# Patient Record
Sex: Female | Born: 1950
Health system: Southern US, Community
[De-identification: ages and names within clinical notes are randomized; demographics above are authoritative.]

## PROBLEM LIST (undated history)

## (undated) DIAGNOSIS — E7800 Pure hypercholesterolemia, unspecified: Secondary | ICD-10-CM

## (undated) DIAGNOSIS — F419 Anxiety disorder, unspecified: Secondary | ICD-10-CM

## (undated) DIAGNOSIS — D649 Anemia, unspecified: Secondary | ICD-10-CM

## (undated) DIAGNOSIS — K219 Gastro-esophageal reflux disease without esophagitis: Secondary | ICD-10-CM

## (undated) DIAGNOSIS — I2510 Atherosclerotic heart disease of native coronary artery without angina pectoris: Secondary | ICD-10-CM

## (undated) DIAGNOSIS — Z8619 Personal history of other infectious and parasitic diseases: Secondary | ICD-10-CM

## (undated) DIAGNOSIS — C189 Malignant neoplasm of colon, unspecified: Secondary | ICD-10-CM

## (undated) DIAGNOSIS — T7840XA Allergy, unspecified, initial encounter: Secondary | ICD-10-CM

## (undated) DIAGNOSIS — M1990 Unspecified osteoarthritis, unspecified site: Secondary | ICD-10-CM

## (undated) DIAGNOSIS — Z0000 Encounter for general adult medical examination without abnormal findings: Secondary | ICD-10-CM

## (undated) DIAGNOSIS — Z124 Encounter for screening for malignant neoplasm of cervix: Secondary | ICD-10-CM

## (undated) DIAGNOSIS — H9190 Unspecified hearing loss, unspecified ear: Secondary | ICD-10-CM

## (undated) DIAGNOSIS — I10 Essential (primary) hypertension: Secondary | ICD-10-CM

## (undated) DIAGNOSIS — H269 Unspecified cataract: Secondary | ICD-10-CM

## (undated) DIAGNOSIS — Z780 Asymptomatic menopausal state: Secondary | ICD-10-CM

## (undated) DIAGNOSIS — E559 Vitamin D deficiency, unspecified: Secondary | ICD-10-CM

## (undated) HISTORY — DX: Asymptomatic menopausal state: Z78.0

## (undated) HISTORY — DX: Anxiety disorder, unspecified: F41.9

## (undated) HISTORY — DX: Unspecified cataract: H26.9

## (undated) HISTORY — DX: Anemia, unspecified: D64.9

## (undated) HISTORY — DX: Unspecified hearing loss, unspecified ear: H91.90

## (undated) HISTORY — DX: Atherosclerotic heart disease of native coronary artery without angina pectoris: I25.10

## (undated) HISTORY — DX: Gastro-esophageal reflux disease without esophagitis: K21.9

## (undated) HISTORY — DX: Unspecified osteoarthritis, unspecified site: M19.90

## (undated) HISTORY — DX: Essential (primary) hypertension: I10

## (undated) HISTORY — PX: CYSTECTOMY: SUR359

## (undated) HISTORY — DX: Allergy, unspecified, initial encounter: T78.40XA

## (undated) HISTORY — PX: CORONARY STENT PLACEMENT: SHX1402

## (undated) HISTORY — DX: Encounter for screening for malignant neoplasm of cervix: Z12.4

## (undated) HISTORY — DX: Malignant neoplasm of colon, unspecified: C18.9

## (undated) HISTORY — DX: Pure hypercholesterolemia, unspecified: E78.00

## (undated) HISTORY — PX: BACK SURGERY: SHX140

## (undated) HISTORY — DX: Vitamin D deficiency, unspecified: E55.9

## (undated) HISTORY — DX: Encounter for general adult medical examination without abnormal findings: Z00.00

## (undated) HISTORY — DX: Personal history of other infectious and parasitic diseases: Z86.19

---

## 2007-05-12 ENCOUNTER — Ambulatory Visit: Payer: Self-pay | Admitting: Cardiovascular Disease

## 2007-05-26 ENCOUNTER — Ambulatory Visit: Payer: Self-pay

## 2007-10-18 ENCOUNTER — Ambulatory Visit: Payer: Self-pay | Admitting: Cardiovascular Disease

## 2007-10-27 ENCOUNTER — Ambulatory Visit: Payer: Self-pay | Admitting: Cardiovascular Disease

## 2007-10-27 LAB — CONVERTED CEMR LAB
ALT: 36 units/L — ABNORMAL HIGH (ref 0–35)
AST: 37 units/L (ref 0–37)
Albumin: 4 g/dL (ref 3.5–5.2)
Bilirubin, Direct: 0.2 mg/dL (ref 0.0–0.3)
HDL: 51.1 mg/dL (ref >39.0)
LDL Cholesterol: 74 mg/dL (ref 0–99)
VLDL: 13 mg/dL (ref 0–40)

## 2008-06-12 ENCOUNTER — Ambulatory Visit: Payer: Self-pay | Admitting: Cardiovascular Disease

## 2008-07-16 HISTORY — PX: CHOLECYSTECTOMY: SHX55

## 2008-09-15 DIAGNOSIS — C189 Malignant neoplasm of colon, unspecified: Secondary | ICD-10-CM

## 2008-09-15 HISTORY — PX: COLON SURGERY: SHX602

## 2008-09-15 HISTORY — PX: BILATERAL OOPHORECTOMY: SHX1221

## 2008-09-15 HISTORY — DX: Malignant neoplasm of colon, unspecified: C18.9

## 2008-12-05 DIAGNOSIS — I10 Essential (primary) hypertension: Secondary | ICD-10-CM | POA: Insufficient documentation

## 2008-12-05 DIAGNOSIS — I209 Angina pectoris, unspecified: Secondary | ICD-10-CM | POA: Insufficient documentation

## 2008-12-05 DIAGNOSIS — E7800 Pure hypercholesterolemia, unspecified: Secondary | ICD-10-CM | POA: Insufficient documentation

## 2008-12-05 DIAGNOSIS — K219 Gastro-esophageal reflux disease without esophagitis: Secondary | ICD-10-CM | POA: Insufficient documentation

## 2008-12-06 ENCOUNTER — Ambulatory Visit: Payer: Self-pay | Admitting: Cardiovascular Disease

## 2008-12-06 LAB — CONVERTED CEMR LAB
ALT: 22 units/L (ref 0–35)
Bilirubin, Direct: 0.2 mg/dL (ref 0.0–0.3)
Cholesterol: 131 mg/dL (ref 0–200)
Creatinine, Ser: 0.6 mg/dL (ref 0.4–1.2)
HDL: 48.6 mg/dL (ref >39.00)
LDL Cholesterol: 65 mg/dL (ref 0–99)
Potassium: 4.1 meq/L (ref 3.5–5.1)
Sodium: 144 meq/L (ref 135–145)
Total Bilirubin: 0.8 mg/dL (ref 0.3–1.2)
Total CK: 83 units/L (ref 7–177)
Total Protein: 7.2 g/dL (ref 6.0–8.3)
VLDL: 17.2 mg/dL (ref 0.0–40.0)

## 2009-02-21 HISTORY — PX: APPENDECTOMY: SHX54

## 2009-03-13 ENCOUNTER — Encounter: Payer: Self-pay | Admitting: Cardiovascular Disease

## 2009-05-28 ENCOUNTER — Ambulatory Visit: Payer: Self-pay | Admitting: Cardiovascular Disease

## 2009-05-29 ENCOUNTER — Encounter (INDEPENDENT_AMBULATORY_CARE_PROVIDER_SITE_OTHER): Payer: Self-pay | Admitting: *Deleted

## 2009-05-29 ENCOUNTER — Encounter: Payer: Self-pay | Admitting: Cardiovascular Disease

## 2009-05-29 ENCOUNTER — Telehealth: Payer: Self-pay | Admitting: Cardiovascular Disease

## 2009-05-30 ENCOUNTER — Ambulatory Visit: Payer: Self-pay | Admitting: Cardiovascular Disease

## 2009-05-30 DIAGNOSIS — D66 Hereditary factor VIII deficiency: Secondary | ICD-10-CM | POA: Insufficient documentation

## 2009-05-30 DIAGNOSIS — R079 Chest pain, unspecified: Secondary | ICD-10-CM | POA: Insufficient documentation

## 2009-05-30 DIAGNOSIS — R0789 Other chest pain: Secondary | ICD-10-CM | POA: Insufficient documentation

## 2009-05-31 ENCOUNTER — Inpatient Hospital Stay (HOSPITAL_BASED_OUTPATIENT_CLINIC_OR_DEPARTMENT_OTHER): Admission: RE | Admit: 2009-05-31 | Discharge: 2009-05-31 | Payer: Self-pay | Admitting: Cardiovascular Disease

## 2009-05-31 ENCOUNTER — Ambulatory Visit: Payer: Self-pay | Admitting: Cardiovascular Disease

## 2009-06-04 LAB — CONVERTED CEMR LAB
BUN: 13 mg/dL (ref 6–23)
Basophils Absolute: 0 10*3/uL (ref 0.0–0.1)
CO2: 26 meq/L (ref 19–32)
Calcium: 9.4 mg/dL (ref 8.4–10.5)
Chloride: 104 meq/L (ref 96–112)
Creatinine, Ser: 0.7 mg/dL (ref 0.4–1.2)
Eosinophils Absolute: 0 10*3/uL (ref 0.0–0.7)
INR: 1 (ref 0.8–1.0)
Lymphocytes Relative: 30 % (ref 12.0–46.0)
MCHC: 32.3 g/dL (ref 30.0–36.0)
MCV: 74.5 fL — ABNORMAL LOW (ref 78.0–100.0)
Monocytes Absolute: 0.5 10*3/uL (ref 0.1–1.0)
Neutrophils Relative %: 61.2 % (ref 43.0–77.0)
Platelets: 283 10*3/uL (ref 150.0–400.0)
Prothrombin Time: 10.3 s (ref 9.1–11.7)
RDW: 14 % (ref 11.5–14.6)

## 2009-06-12 ENCOUNTER — Encounter: Payer: Self-pay | Admitting: Cardiovascular Disease

## 2009-06-13 ENCOUNTER — Ambulatory Visit: Payer: Self-pay | Admitting: Cardiovascular Disease

## 2009-06-13 DIAGNOSIS — I2510 Atherosclerotic heart disease of native coronary artery without angina pectoris: Secondary | ICD-10-CM | POA: Insufficient documentation

## 2009-06-19 ENCOUNTER — Telehealth (INDEPENDENT_AMBULATORY_CARE_PROVIDER_SITE_OTHER): Payer: Self-pay | Admitting: *Deleted

## 2009-07-09 ENCOUNTER — Telehealth (INDEPENDENT_AMBULATORY_CARE_PROVIDER_SITE_OTHER): Payer: Self-pay | Admitting: *Deleted

## 2009-10-10 ENCOUNTER — Encounter (INDEPENDENT_AMBULATORY_CARE_PROVIDER_SITE_OTHER): Payer: Self-pay | Admitting: *Deleted

## 2009-11-01 ENCOUNTER — Encounter: Payer: Self-pay | Admitting: Cardiovascular Disease

## 2009-11-16 ENCOUNTER — Ambulatory Visit: Payer: Self-pay | Admitting: Cardiovascular Disease

## 2010-05-07 ENCOUNTER — Encounter: Payer: Self-pay | Admitting: Cardiovascular Disease

## 2010-10-17 NOTE — Letter (Signed)
Summary: Appointment - Reminder 2  [employer], Main Office  [address]   [place], [place] [address]   Phone: [phone]  Fax: [phone]     October 10, 2009 MRN: [id number]   Brenda Lara [address] [place], [place]  [address]   Dear Brenda Lara,  Our records indicate that it is time to schedule a follow-up appointment with Dr. Eden Emms. It is very important that we reach you to schedule this appointment. We look forward to participating in your health care needs. Please contact us at the number listed above at your earliest convenience to schedule your appointment.  If you are unable to make an appointment at this time, give [place] a call so we can update our records.  Sincerely,   Migdalia D[facility] Scheduling Team

## 2010-10-17 NOTE — Assessment & Plan Note (Signed)
Summary: per check out/sf  Medications Added * VITAMIN D2 3 by mouth monthly INTEGRA PLUS  CAPS (FEFUM-FEPOLY-FA-B CMP-C-BIOT) 1 by mouth daily      Allergies Added: NKDA  History of Present Illness: This is a 60 year old white female patient with history of coronary artery disease, status post stenting of the RCA in 2006 and LAD in 2005. She came into the hospital with chest pain and had repeat cardiac catheterization, May 31, 2009. This showed the patient's stents were widely patent and she had no other significant disease. Her ejection fraction was 55% with mild apical hypokinesis. She is doing well with no SSCP.  I reviewed her labs from Dr Caprice Red in [place].  Date 11/02/09.  Her HDL was 76, LDL 69 and other labs normal.  She needs a refill on her Plavix.    Current Problems (verified): 1)  Cad, Native Vessel  (ICD-414.01) 2)  Pre-operative Cardiovascular Examination  (ICD-V72.81) 3)  Congenital Factor Viii Disorder  (ICD-286.0) 4)  Chest Pain  (ICD-786.50) 5)  Hypertension  (ICD-401.9) 6)  Gerd  (ICD-530.81) 7)  Hypercholesterolemia  (ICD-272.0) 8)  Angina, Atypical  (ICD-413.9)  Current Medications (verified): 1)  Aspirin 325 Mg  Tabs (Aspirin) .Marland Kitchen.. 1 Tab By Mouth Once Daily 2)  Atenolol 25 Mg Tabs (Atenolol) .Marland Kitchen.. 1 Tab By Mouth Once Daily 3)  Omeprazole 20 Mg Cpdr (Omeprazole) .Marland Kitchen.. 1 Tab By Mouth Once Daily 4)  Simvastatin 80 Mg Tabs (Simvastatin) .Marland Kitchen.. 1 Tab By Mouth At Bedtime 5)  Vitamin D2 .Marland Kitchen.. 3 By Mouth Monthly 6)  Plavix 75 Mg Tabs (Clopidogrel Bisulfate) .... Take One Daily 7)  Integra Plus  Caps (Fefum-Fepoly-Fa-B Cmp-C-Biot) .Marland Kitchen.. 1 By Mouth Daily  Allergies (verified): No Known Drug Allergies  Past History:  Past Medical History: Last updated: 12/05/2008 Current Problems:  HYPERTENSION (ICD-401.9) GERD (ICD-530.81) HYPERCHOLESTEROLEMIA (ICD-272.0) ANGINA, ATYPICAL (ICD-413.9)  Past Surgical History: Last updated: 12/05/2008 stenting of her LAD in  2005  stenting of the RCA in 2006 multiple back surgeries. cyst removal  Family History: Last updated: 12/05/2008  Remarkable for premature coronary artery disease.   Mother had a heart attach in her 20s and father had 1 in his 34s.   Social History: Last updated: 12/05/2008   She has grandchildren in the [place] area.   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  Review of Systems       Denies fever, malais, weight loss, blurry vision, decreased visual acuity, cough, sputum, SOB, hemoptysis, pleuritic pain, palpitaitons, heartburn, abdominal pain, melena, lower extremity edema, claudication, or rash.   Vital Signs:  Patient profile:   60 year old female Height:      61 inches Weight:      161 pounds BMI:     30.53 Pulse rate:   63 / minute Resp:     16 per minute BP sitting:   147 / 85  (left arm)  Vitals Entered By: Marrion Coy, CNA (November 16, 2009 9:21 AM)  Physical Exam  General:  Affect appropriate Healthy:  appears stated age HEENT: normal Neck supple with no adenopathy JVP normal no bruits no thyromegaly Lungs clear with no wheezing and good diaphragmatic motion Heart:  S1/S2 no murmur,rub, gallop or click PMI normal Abdomen: benighn, BS positve, no tenderness, no AAA no bruit.  No HSM or HJR Distal pulses intact with no bruits No  edema Neuro non-focal Skin warm and dry    Impression & Recommendations:  Problem # 1:  CAD, NATIVE VESSEL (ICD-414.01) Stable no angina.  Prefer to keep her on Plavix with two stents Her updated medication list for this problem includes:    Aspirin 325 Mg Tabs (Aspirin) .Marland Kitchen... 1 tab by mouth once daily    Atenolol 25 Mg Tabs (Atenolol) .Marland Kitchen... 1 tab by mouth once daily    Plavix 75 Mg Tabs (Clopidogrel bisulfate) .Marland Kitchen... Take one daily  Problem # 2:  HYPERTENSION (ICD-401.9) Well controlled on home readings.   Consider adding ace in future Her updated medication list for this problem includes:    Aspirin 325 Mg Tabs (Aspirin) .Marland Kitchen... 1 tab by mouth once daily    Atenolol 25 Mg Tabs (Atenolol) .Marland Kitchen... 1 tab by mouth once daily  Problem # 3:  HYPERCHOLESTEROLEMIA (ICD-272.0) Great labs last month.  No side effects continue statin Her updated medication list for this problem includes:    Simvastatin 80 Mg Tabs (Simvastatin) .Marland Kitchen... 1 tab by mouth at bedtime  Patient Instructions: 1)  Follow up in 1 year Prescriptions: PLAVIX 75 MG TABS (CLOPIDOGREL BISULFATE) take one daily  #30 x 12   Entered by:   Meredith Staggers, RN   Authorized by:   Colon Branch, MD, [facility]   Signed by:   Meredith Staggers, RN on 11/16/2009   Method used:   [facility] to        [phone][facility] (retail)       [address] [place], [place]  [address]       Ph: [phone]       Fax: [phone]   RxID:   [id number]

## 2010-11-27 ENCOUNTER — Encounter: Payer: Self-pay | Admitting: Cardiovascular Disease

## 2010-12-11 ENCOUNTER — Ambulatory Visit (INDEPENDENT_AMBULATORY_CARE_PROVIDER_SITE_OTHER): Payer: 59 | Admitting: Cardiovascular Disease

## 2010-12-11 ENCOUNTER — Encounter: Payer: Self-pay | Admitting: Cardiovascular Disease

## 2010-12-11 VITALS — BP 154/86 | HR 56 | Resp 12 | Ht 61.0 in | Wt 142.0 lb

## 2010-12-11 DIAGNOSIS — I10 Essential (primary) hypertension: Secondary | ICD-10-CM

## 2010-12-11 DIAGNOSIS — E7800 Pure hypercholesterolemia, unspecified: Secondary | ICD-10-CM

## 2010-12-11 DIAGNOSIS — I2510 Atherosclerotic heart disease of native coronary artery without angina pectoris: Secondary | ICD-10-CM

## 2010-12-11 NOTE — Patient Instructions (Signed)
Your physician recommends that you schedule a follow-up appointment in: 1year with Dr. Nishan  

## 2010-12-11 NOTE — Progress Notes (Signed)
This is a 60 year old white female patient with history of coronary artery disease, status post stenting of the RCA in 2006 and LAD in 2005. She came into the hospital with chest pain and had repeat cardiac catheterization, May 31, 2009. This showed the patient's stents were widely patent and she had no other significant disease. Her ejection fraction was 55% with mild apical hypokinesis. She is doing well with no SSCP.  I reviewed her labs from Dr Caprice Red in [place].  Date 11/02/09.  Her HDL was 76, LDL 69 and other labs normal.  She needs a refill on her Plavix.    ROS: Denies fever, malais, weight loss, blurry vision, decreased visual acuity, cough, sputum, SOB, hemoptysis, pleuritic pain, palpitaitons, heartburn, abdominal pain, melena, lower extremity edema, claudication, or rash.   General: Affect appropriate Healthy:  appears stated age HEENT: normal Neck supple with no adenopathy JVP normal no bruits no thyromegaly Lungs clear with no wheezing and good diaphragmatic motion Heart:  S1/S2 no murmur,rub, gallop or click PMI normal Abdomen: benighn, BS positve, no tenderness, no AAA no bruit.  No HSM or HJR Distal pulses intact with no bruits No edema Neuro non-focal Skin warm and dry No muscular weakness   Current Outpatient Prescriptions  Medication Sig Dispense Refill  . aspirin 325 MG tablet Take 325 mg by mouth daily.        Marland Kitchen atenolol (TENORMIN) 25 MG tablet Take 25 mg by mouth daily.        . clopidogrel (PLAVIX) 75 MG tablet Take 75 mg by mouth daily.        . Ergocalciferol (VITAMIN D2 PO) 3 tabs monthly       . FeFum-FePoly-FA-B Cmp-C-Biot (INTEGRA PLUS) CAPS Take by mouth daily.        Marland Kitchen omeprazole (PRILOSEC) 20 MG capsule Take 20 mg by mouth daily.        . simvastatin (ZOCOR) 80 MG tablet 1/2 tab po qd        Allergies NKDA  Electrocardiogram:  NSR 56 Normal ECG  Assessment and Plan

## 2010-12-11 NOTE — Assessment & Plan Note (Signed)
Well controlled.  Continue current medications and low sodium Dash type diet.    

## 2010-12-11 NOTE — Assessment & Plan Note (Signed)
Zocor decreased to 40 mg by primary.  3/12 LDL 59 and HDL 69 with normal LFT;s

## 2010-12-11 NOTE — Assessment & Plan Note (Signed)
Stable with no angina and good activity level.  Continue medical Rx  

## 2010-12-21 ENCOUNTER — Other Ambulatory Visit: Payer: Self-pay | Admitting: *Deleted

## 2010-12-21 MED ORDER — SIMVASTATIN 40 MG PO TABS: 40.0000 mg | ORAL_TABLET | Freq: Every evening | ORAL | Status: DC

## 2011-01-28 NOTE — Assessment & Plan Note (Signed)
[facility]                            CARDIOLOGY OFFICE NOTE   NAME:Brenda Lara                     MRN:          [id number]  DATE:06/12/2008                            DOB:          25-Sep-1950    [name] returns today for followup.  She has had history of stenting  of her LAD in 2005 and stenting of the RCA in 2006.  Her last Myoview in  September 2008 was nonischemic.   She has had a couple of episodes last Thursday of fleeting pain.  It was  nonexertional.  She did not have time to take nitro.  It was a stabbing-  type pressure.  It did not sound like angina.  I had a lengthy  discussion with her.  She does have a reasonable anginal warning system  prior to her previous stents.  She will call me if she has to take any  nitro or has more classic exertional pain.  Currently, I do not think  she needs a followup Myoview or heart cath.   She has been compliant with her meds.   REVIEW OF SYSTEMS:  Otherwise, negative.  No palpitations, syncope,  edema.   She is on Zocor 80 a day, atenolol 25 a day, Prilosec 20 a day, and  aspirin a day.   She has moved to [place].  Her husband got a job with [employer] which  is the business he was in previously.   She is looking forward to the move.  She do not particularly like being  in [place].   PHYSICAL EXAMINATION:  GENERAL:  Her exam is remarkable for a healthy-  appearing, middle-aged white female in no distress.  VITAL SIGNS:  Her weight is 158, blood pressure 140/78, pulse 60 and  regular, respiratory rate 14, afebrile.  HEENT:  Unremarkable.  Carotids normal without bruit.  No  lymphadenopathy, thyromegaly, or JVP elevation.  LUNGS:  Clear.  Good diaphragmatic motion.  No wheezing.  S1 and S2.  Normal heart sounds.  PMI normal.  ABDOMEN:  Benign.  Bowel sounds positive.  No AAA.  No tenderness.  No  bruit.  No hepatosplenomegaly or hepatojugular reflux.  No tenderness.  EXTREMITIES:  Distal pulses are intact.  No edema.  NEUROLOGIC:  Nonfocal.  SKIN:  Warm and dry.  No muscular weakness.   EKG is normal.   IMPRESSION:  1. Chest pressure, atypical.  Continue to monitor.  Aspirin a day.  2. History of angioplasty of the right coronary artery and left      anterior descending, Myoview September 2008, nonischemic.  Continue      aspirin and beta-blocker.  3. Hypercholesteremia in the setting of known coronary disease.      Continue Zocor.  Lipid and liver profile in 6 months.  4. History of reflux.  Continue Prilosec 20 a day.   Overall, I think the patient is doing well.  She will call us if she has  any more typical symptoms.  Otherwise, I will see her back in 6 months.     Brenda Lara. Eden Emms, MD,  [facility]  Electronically Signed    PCN/MedQ  DD: 06/12/2008  DT: 06/13/2008  Job #: [id number]

## 2011-01-28 NOTE — Assessment & Plan Note (Signed)
[facility]                            CARDIOLOGY OFFICE NOTE   NAME:Brenda Lara, Brenda Lara                     MRN:          [id number]  DATE:10/18/2007                            DOB:          March 02, 1951    Ms. Degracia returns a follow-up.  Unfortunately, she still does not have  a primary care doctor in [place].  She has a history of coronary  artery stents to the LAD in 2003 and the RCA in2005. Her stress Myoview  in May 26, 2007 was nonischemic with a good EF. She is not having  chest pain.  She needs a refill on her nitro.  She also needs followup  lipid and liver profile. The only labs I have on her are from  [place] were done December 02, 2006 and showed an LDL cholesterol of  88 with normal LFTs.   REVIEW OF SYSTEMS:  Is negative.  In particular, she has not any  significant chest pain, myalgias, PND, orthopnea, palpitations or  syncope.   CURRENT MEDICATIONS:  1. Zocor 80 a day.  2. Atenolol 25 a day.  3. Prilosec 20 a day.  4. Aspirin a day.  She uses [facility] in [place].   PHYSICAL EXAMINATION:  Is remarkable for healthy-appearing middle-aged  white female in no distress.  Weight is 156, blood pressure is 130/70,  pulse 66 regular, afebrile, respiratory rate 14.  HEENT:  Unremarkable.  Carotids normal without bruit; no  lymphadenopathy, thyromegaly, JVP elevation.  LUNGS:  Clear  diaphragmatic motion.  No wheezing.  S1-S2 normal heart sounds.  PMI normal.  ABDOMEN:  Benign.  Bowel sounds positive.  No bruit, no tenderness, no  AAA, no hepatosplenomegaly or hepatojugular reflux.  Distal pulses are intact, no edema.  NEURO: Nonfocal.  SKIN:  Warm and dry.  No muscular weakness.   EKG is normal.   IMPRESSION:  1. Two-vessel coronary disease with stents, nonischemic Myoview, no      chest pain.  Continue aspirin and beta blocker.  2. Hypercholesterolemia, LDL cholesterol in fairly good range at 80.      Followup  lipid and liver profile when she comes back down to      [place] in the next month or so.  3. History of reflux.  Continue Prilosec 20 a day.  Avoid late-night      meals and spicy food.  4. Hypertension, currently well controlled on low-dose beta blocker;      heart rate not excessively low; continue low-salt diet.   The patient will continue to try to find a primary care doctor in  [place] area.  I will see her back in September I suspect we will  do a stress test every 2 years since she is nondiabetic.     Noralyn Pick. Eden Emms, MD, [facility]  Electronically Signed    PCN/MedQ  DD: 10/18/2007  DT: 10/18/2007  Job #: [id number]

## 2011-01-28 NOTE — Assessment & Plan Note (Signed)
[facility]                            CARDIOLOGY OFFICE NOTE   NAME:Brenda Lara, Brenda Lara                     MRN:          [id number]  DATE:12/06/2008                            DOB:          [birth year]    Brenda Lara returns today for followup.  She has had previous stenting of the  RCA in 2006 and LAD in 2005.  She did not have chest pain at this time.  She had significant arm pain, diagnosis took a little while to make.   I questioned her closely today, she has not had any significant arm  pains with exertion.  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  She had  some dizziness and had to sit down for 30 minutes.  There was no  associated diaphoresis or palpitations.  It sounded more like a vagal  episode in hot weather.   The patient otherwise has been doing well.  She needs to have a lipid  and liver profile done today.  She is on fairly high-dose Zocor, but  does not have any significant myalgias or muscle pains.  Since I last  saw her, she had her gallbladder out in [place] in December.   The patient and her husband have recently moved from [place] area  to [place] as he took a different job with [employer].   The patient has been compliant with her medication.  She does get  occasional reflux, which she takes Prilosec for, but this is stable.  [facility] [place]   She has no known allergies.   Family history is noncontributory.   The patient is happily married.  Her husband works for [employer].  She  does not smoke or drink.  She tries to walk on a regular basis.   Her current meds include Zocor 80 a day, atenolol 25 a day, Prilosec 20  a day, an aspirin a day.   PHYSICAL EXAMINATION:  GENERAL:  Remarkable for healthy-appearing female  in no distress.  Affect is appropriate.  VITAL SIGNS:  Weight is 158, blood pressure 150/80, pulse 60 and  regular, respiratory rate 14, afebrile.  HEENT:  Unremarkable.  NECK:  Carotids are normal without bruit.  No lymphadenopathy,  thyromegaly, or JVP elevation.  LUNGS:  Clear, good diaphragmatic motion.  No wheezing.  S1 and S2.  Normal heart sounds.  PMI normal.  ABDOMEN:  Benign status post cholecystectomy.  No AAA, no tenderness, no  bruit, no hepatosplenomegaly or hepatojugular stenosis.  EXTREMITIES:  Distal pulses intact.  No edema.  NEURO:  Nonfocal.  SKIN:  Warm and dry.  MUSCULOSKELETAL:  No muscular weakness.   Baseline EKG is normal except for relative bradycardia.  Reviewed her  nuclear study from May 26, 2007, which was normal with an EF of  72%.   Reviewed lab work from October 27, 2007, which showed normal LFTs and  an LDL of 74.   IMPRESSION:  1. Coronary artery disease.  Previous stents to the left anterior  descending and right coronary artery.  Continue aspirin and beta-      blocker therapy.  No indication for followup Myoview at this time.  2. Hypercholesterolemia.  Check lipid and liver profile today.  Since      the literature reports higher incidence of muscle problems with      Zocor, we will check a CPK as well.  Her LDL has been within target      range on this dose of Zocor.  3. History of reflux.  Continue Prilosec.  Avoid spicy foods and late-      night meals.  4. Status post cholecystectomy, symptoms improved.  Wound has healed      well.  Follow up with GI doctor in [place] for this.   Overall, I think [name] is doing well, and I will see her back in 6  months.     Brenda Lara. Brenda Emms, MD, [facility]  Electronically Signed    PCN/MedQ  DD: 12/06/2008  DT: 12/06/2008  Job #: [id number]

## 2011-01-28 NOTE — Assessment & Plan Note (Addendum)
[facility]                            CARDIOLOGY OFFICE NOTE   NAME:Brenda, THARA Lara                     MRN:          [id number]  DATE:05/12/2007                            DOB:          1951-07-30    [name] is seen as a new patient by me.  She was referred by  [facility].   The patient brought with her approximately 60 pages of notes that I had  to read through.  This took me about 20 minutes prior to seeing her.   The patient has traveled quite a bit.  She originally lived in  [place] when her husband worked for [employer].  They were transferred  to [place] and to [place].  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   The patient has a history of coronary artery disease with a bare metal  stent to the LAD back in, I believe, 2003.  She had a [employer]  stent to the right coronary artery in February of 2005.  She was then  lost to followup pretty much from May of 2006 until now.  This had to do  with insurance reasons.  They have been traveling a lot and when her  husband lost his job at [employer], they had no insurance for about 2  years.  They subsequently are working at a new company in [place]  and she wanted to be reestablished with a heart doctor.  Her initial  angina was actually arm pain.  She was having right greater than left  arm aching for about a year before the diagnosis of coronary artery  disease was made.  She clearly has not had any recurrence of this.  She  does not get chest pain, PND, or orthopnea.  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  There has been no PND, orthopnea.  No palpitations.  No edema.  No syncope.   Her coronary risk factors primarily include a family history of coronary  disease and hyperlipidemia.   She has been compliant with her  medications.   REVIEW OF SYSTEMS:  Otherwise, negative.   PAST MEDICAL HISTORY:  Remarkable for 2 coronary artery stents, back  surgery x2 one in 1974 and one in 1984.  Previous cyst removal for  endometriosis.  Hypercholesterolemia on therapy.  Reflux with H2  blocker.   FAMILY HISTORY:  Remarkable for premature coronary artery disease.  Mother had a heart attach in her 79s and father had 1 in his 34s.   The patient is married.  She has 1 child.  Her husband has 2 children  from another marriage.  She has grandchildren in the [place] area.  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  She does not drink or smoke.   CURRENT MEDICATIONS:  1. Zocor 80 a day.  2. Atenolol 25 a day.  3. Prilosec 20 a day.  4. An aspirin a day.   EXAM:  Remarkable for a healthy-appearing middle-aged white female in no  distress.  Blood pressure is 130/60, pulse 59 and regular, weight is 163.  She is  afebrile.  Respiratory rate is 14.  HEENT:  Normal.  Carotids normal without bruit.  There is no lymphadenopathy.  No  thyromegaly.  No jugular venous pressure elevation.  LUNGS:  Clear.  Good diaphragmatic motion.  No wheezing.  There is an S1, S2 with normal heart sounds.  PMI is normal.  ABDOMEN:  Benign.  There is no tenderness.  Bowel sounds positive.  No  AAA.  No hepatosplenomegaly.  No hepatojugular reflux.                                                              Femorals are +2 without bruit.  PT +2.  There is no lower extremity  edema.  NEURO:  Nonfocal.  SKIN:  Warm and dry.  There is no muscular weakness.   EKG:  Normal.   IMPRESSION:  1. Coronary artery disease previous bare metal stent to the left      anterior descending and a drug-eluting stent to the right coronary      artery.  Continue aspirin therapy and beta blocker.  Followup      stress Myoview.  She has  nitroglycerin if she needs it.  2. Hypercholesterolemia.  Her LDL cholesterol when last checked was      71.  She will continue to take her Zocor at 80 a day and there have      been normal LFTs, apparently per her primary care doctor in      [place].  She should have these followed up in 6 months.  3. History of reflux.  Continue Prilosec 20 mg a day.  Avoid spicy      foods and late night meals.  4. Previous history of multiple back surgeries.  Currently stable.      Does limit her exercise somewhat.  P.r.n. Motrin.  Overall, I think      the patient is fortunate that she has not had any problems over the      2-and-a-half years that she has not had followup.  I will see her      in 6 months as long as her stress Myoview is normal.     Theron Arista C. Eden Emms, MD, [facility]  Electronically Signed    PCN/MedQ  DD: 05/12/2007  DT: 05/13/2007  Job #: [id number]

## 2011-05-13 ENCOUNTER — Emergency Department (HOSPITAL_COMMUNITY): Payer: 59

## 2011-05-13 ENCOUNTER — Emergency Department (HOSPITAL_COMMUNITY)
Admission: EM | Admit: 2011-05-13 | Discharge: 2011-05-13 | Disposition: A | Discharge status: Home / Self Care | Payer: 59 | Attending: Emergency Medicine | Admitting: Emergency Medicine

## 2011-05-13 DIAGNOSIS — Z85038 Personal history of other malignant neoplasm of large intestine: Secondary | ICD-10-CM | POA: Insufficient documentation

## 2011-05-13 DIAGNOSIS — H538 Other visual disturbances: Secondary | ICD-10-CM | POA: Insufficient documentation

## 2011-05-13 DIAGNOSIS — I2510 Atherosclerotic heart disease of native coronary artery without angina pectoris: Secondary | ICD-10-CM | POA: Insufficient documentation

## 2011-05-13 DIAGNOSIS — R110 Nausea: Secondary | ICD-10-CM | POA: Insufficient documentation

## 2011-05-13 DIAGNOSIS — R42 Dizziness and giddiness: Secondary | ICD-10-CM | POA: Insufficient documentation

## 2011-05-13 DIAGNOSIS — R5381 Other malaise: Secondary | ICD-10-CM | POA: Insufficient documentation

## 2011-05-13 DIAGNOSIS — Z79899 Other long term (current) drug therapy: Secondary | ICD-10-CM | POA: Insufficient documentation

## 2011-05-13 DIAGNOSIS — R51 Headache: Secondary | ICD-10-CM | POA: Insufficient documentation

## 2011-05-13 LAB — DIFFERENTIAL
Eosinophils Absolute: 0 10*3/uL (ref 0.0–0.7)
Lymphs Abs: 1.4 10*3/uL (ref 0.7–4.0)
Monocytes Relative: 4 % (ref 3–12)
Neutrophils Relative %: 80 % — ABNORMAL HIGH (ref 43–77)

## 2011-05-13 LAB — URINALYSIS, ROUTINE W REFLEX MICROSCOPIC
Leukocytes, UA: NEGATIVE
Nitrite: NEGATIVE
Specific Gravity, Urine: 1.006 (ref 1.005–1.030)
Urobilinogen, UA: 0.2 mg/dL (ref 0.0–1.0)
pH: 6 (ref 5.0–8.0)

## 2011-05-13 LAB — COMPREHENSIVE METABOLIC PANEL
AST: 22 U/L (ref 0–37)
Albumin: 3.9 g/dL (ref 3.5–5.2)
Alkaline Phosphatase: 89 U/L (ref 39–117)
Chloride: 104 mEq/L (ref 96–112)
Creatinine, Ser: 0.51 mg/dL (ref 0.50–1.10)
Potassium: 3.9 mEq/L (ref 3.5–5.1)
Total Bilirubin: 0.4 mg/dL (ref 0.3–1.2)
Total Protein: 7 g/dL (ref 6.0–8.3)

## 2011-05-13 LAB — CBC
HCT: 37.7 % (ref 36.0–46.0)
Hemoglobin: 13 g/dL (ref 12.0–15.0)
MCH: 29.1 pg (ref 26.0–34.0)
MCHC: 34.5 g/dL (ref 30.0–36.0)
MCV: 84.5 fL (ref 78.0–100.0)

## 2011-05-13 LAB — APTT: aPTT: 24 seconds (ref 24–37)

## 2011-05-13 MED ORDER — GADOBENATE DIMEGLUMINE 529 MG/ML IV SOLN
13.0000 mL | Freq: Once | INTRAVENOUS | Status: AC
  Administered 2011-05-13: 13 mL via INTRAVENOUS

## 2011-05-14 LAB — URINE CULTURE: Colony Count: 25000

## 2011-05-29 ENCOUNTER — Ambulatory Visit (INDEPENDENT_AMBULATORY_CARE_PROVIDER_SITE_OTHER): Payer: 59 | Admitting: Internal Medicine

## 2011-05-29 ENCOUNTER — Encounter: Payer: Self-pay | Admitting: Internal Medicine

## 2011-05-29 VITALS — BP 148/70 | HR 70 | Temp 97.4°F | Resp 12 | Ht 61.5 in | Wt 142.0 lb

## 2011-05-29 DIAGNOSIS — F411 Generalized anxiety disorder: Secondary | ICD-10-CM

## 2011-05-29 DIAGNOSIS — F064 Anxiety disorder due to known physiological condition: Secondary | ICD-10-CM

## 2011-05-29 DIAGNOSIS — Z85038 Personal history of other malignant neoplasm of large intestine: Secondary | ICD-10-CM | POA: Insufficient documentation

## 2011-05-29 DIAGNOSIS — I10 Essential (primary) hypertension: Secondary | ICD-10-CM

## 2011-05-29 DIAGNOSIS — R42 Dizziness and giddiness: Secondary | ICD-10-CM

## 2011-05-29 DIAGNOSIS — C189 Malignant neoplasm of colon, unspecified: Secondary | ICD-10-CM | POA: Insufficient documentation

## 2011-05-29 DIAGNOSIS — F419 Anxiety disorder, unspecified: Secondary | ICD-10-CM

## 2011-05-29 DIAGNOSIS — Z780 Asymptomatic menopausal state: Secondary | ICD-10-CM | POA: Insufficient documentation

## 2011-05-29 MED ORDER — ALPRAZOLAM 0.25 MG PO TABS: ORAL_TABLET | ORAL | Status: DC

## 2011-05-29 NOTE — Progress Notes (Signed)
Subjective:    Patient ID: Brenda Lara, female    DOB: 07/03/1951, 60 y.o.   MRN: [id number]  HPI  New pt here for first visit.  Just moved to [place] last 3 weeks fro [place].   Former primary [employer] saw the PA, [place].   Also sees Dr. Eden Emms cardiology and Dr. Freida Busman GI PMH of CAD S/P stenting to RCA and LAD, HTN, Hyperlipidemia, vertigo,  Colon CA S/P R hemicolectomy, HNP S/P discectomy.    Las colonoscopy 1/12,  Last mammogram 1/12    She is concerned about two issues, vertigo and anxiety.   She was seen in [facility] ER 8/28  For vertigo that began 8/27.  She has been diagnosed with vertigo dating back to 2006 where it was even more severe.  Work up at that time was negative and she was taught Epley maneuver which helped some.  She reports she is about 50% improved since 8/27.  CT  w/o showed nonspecific white matter signal in frontal lobes.  Subsequent MRI w and w/o showed no acute infarction or mass and nonspecific non enhancing white matter signal favoring chronic small vessel disease and intracranial MRA was negative.  CBC, Chemistries and coags were normal.   She has been using her antivert intermittantly and has been able to function.  She does not report any visual changes, no speech changes, no numbnesss or muscle weakness.  Regarding anxiety, she notes she "can't relax, very tense, can't get to sleep"  In the morning before work her hands shake due to nervousness and she talks to herself to calm her down.  She denies panic feeling, denies depression or sadness.  Mother had anxiety and pt was treated With Valium in her 90's after birth of a child.  No chest pain, SOB, Symptoms occure last couple of weeks  No Known Allergies Past Medical History  Diagnosis Date  . HTN (hypertension)   . GERD (gastroesophageal reflux disease)   . Pure hypercholesterolemia   . Angina pectoris   . Menopause   . Anemia   . Anxiety   . Colon cancer 2010  . Coronary artery disease    S/P stenting to RCA and LAD   Past Surgical History  Procedure Date  . Coronary stent placement 2006    RCA  . Coronary stent placement 2006    RCA  . Back surgery 1978, 1988    multiple  . Cystectomy   . Colon surgery     right colectomy  . Bilateral oophorectomy 2010  . Appendectomy 02/21/2009  . Cholecystectomy 11/09   History   Social History  . Marital Status: Married    Spouse Name: N/A    Number of Children: N/A  . Years of Education: N/A   Occupational History  . Not on file.   Social History Main Topics  . Smoking status: Never Smoker   . Smokeless tobacco: Never Used  . Alcohol Use: 0.5 oz/week    1 drink(s) per week  . Drug Use: No  . Sexually Active: Yes    Birth Control/ Protection: Post-menopausal   Other Topics Concern  . Not on file   Social History Narrative  . No narrative on file   Family History  Problem Relation Age of Onset  . Coronary artery disease    . Heart attack Mother     40's  . Hypertension Mother   . Transient ischemic attack Mother   . Heart attack Father  60's  . Cancer Father     prostate  . Heart disease Father    Patient Active Problem List  Diagnoses  . HYPERCHOLESTEROLEMIA  . CONGENITAL FACTOR VIII DISORDER  . HYPERTENSION  . ANGINA, ATYPICAL  . CAD, NATIVE VESSEL  . GERD  . CHEST PAIN  . Anxiety  . Colon cancer  . Menopause  . Vertigo   Current Outpatient Prescriptions on File Prior to Visit  Medication Sig Dispense Refill  . aspirin 325 MG tablet Take 325 mg by mouth daily.        Marland Kitchen atenolol (TENORMIN) 25 MG tablet Take 25 mg by mouth daily.        . clopidogrel (PLAVIX) 75 MG tablet Take 75 mg by mouth daily.        . Ergocalciferol (VITAMIN D2 PO) Take 1.25 mg by mouth. 3 tabs monthly      . FeFum-FePoly-FA-B Cmp-C-Biot (INTEGRA PLUS) CAPS Take 1 capsule by mouth daily.       Marland Kitchen omeprazole (PRILOSEC) 20 MG capsule Take 20 mg by mouth daily.        . simvastatin (ZOCOR) 40 MG tablet Take 1 tablet  (40 mg total) by mouth every evening.  30 tablet  11          Review of Systems See HPI    Objective:   Physical Exam  Physical Exam  Nursing note and vitals reviewed.  Constitutional: She is oriented to person, place, and time. She appears well-developed and well-nourished.  HENT:  Head: Normocephalic and atraumatic.  Cardiovascular: Normal rate and regular rhythm. Exam reveals no gallop and no friction rub.  No murmur heard.  Pulmonary/Chest: Breath sounds normal. She has no wheezes. She has no rales.  Neurological: She is alert and oriented to person, place, and time.  Skin: Skin is warm and dry.  Psychiatric: She has a normal mood and affect. Her behavior is normal. Neurologic:   CNII-XII  Intact                      Motor 5/5 muscle groups tested                      Sensory intact to pinprick                      Cerebellar intact FTN and HTS       Assessment & Plan:  1)  Anxiety OK to try Xanax 0.25 mg bid prn Rx #10 with 2 refills.  Counseled to not take Antivert along with Xanax.  Will check TSH.  Recent labs normall See scanned labs  2)  Vertigo:  Neg MRI and MRA with exception of white signal lesion frontal lobes that favors small vessel disease.   I counseled differential of vasculitis, MS,  Migraines.                     Advised pt we will repeat in one year. 3)  CAD:  Stable  4)  HTN:  Not at [place] 5)  Hyperlipidemia  Dr. Eden Emms following 6)  Colon CA  :  Has oncologist in [place] 7)  GERD 8)  Menopause  To return in 4 weeks or sooner as needed I spent 45 mintues with this pt.

## 2011-05-29 NOTE — Patient Instructions (Signed)
Take med as prescribed  Call if any worsening  See me in 4 weeks

## 2011-06-02 ENCOUNTER — Encounter: Payer: Self-pay | Admitting: Emergency Medicine

## 2011-07-02 ENCOUNTER — Encounter: Payer: Self-pay | Admitting: Internal Medicine

## 2011-07-02 ENCOUNTER — Ambulatory Visit (INDEPENDENT_AMBULATORY_CARE_PROVIDER_SITE_OTHER): Payer: 59 | Admitting: Internal Medicine

## 2011-07-02 VITALS — BP 130/74 | HR 64 | Temp 96.9°F | Resp 12 | Ht 61.5 in | Wt 145.0 lb

## 2011-07-02 DIAGNOSIS — Z01419 Encounter for gynecological examination (general) (routine) without abnormal findings: Secondary | ICD-10-CM

## 2011-07-02 DIAGNOSIS — F419 Anxiety disorder, unspecified: Secondary | ICD-10-CM

## 2011-07-02 DIAGNOSIS — E785 Hyperlipidemia, unspecified: Secondary | ICD-10-CM

## 2011-07-02 DIAGNOSIS — Z113 Encounter for screening for infections with a predominantly sexual mode of transmission: Secondary | ICD-10-CM

## 2011-07-02 DIAGNOSIS — F411 Generalized anxiety disorder: Secondary | ICD-10-CM

## 2011-07-02 DIAGNOSIS — Z23 Encounter for immunization: Secondary | ICD-10-CM

## 2011-07-02 DIAGNOSIS — R42 Dizziness and giddiness: Secondary | ICD-10-CM

## 2011-07-02 DIAGNOSIS — Z1272 Encounter for screening for malignant neoplasm of vagina: Secondary | ICD-10-CM

## 2011-07-02 LAB — COMPREHENSIVE METABOLIC PANEL
Alkaline Phosphatase: 86 U/L (ref 39–117)
BUN: 13 mg/dL (ref 6–23)
CO2: 26 mEq/L (ref 19–32)
Creat: 0.72 mg/dL (ref 0.50–1.10)
Glucose, Bld: 86 mg/dL (ref 70–99)
Sodium: 138 mEq/L (ref 135–145)
Total Bilirubin: 0.4 mg/dL (ref 0.3–1.2)
Total Protein: 7 g/dL (ref 6.0–8.3)

## 2011-07-02 LAB — LIPID PANEL
Cholesterol: 157 mg/dL (ref 0–200)
HDL: 63 mg/dL (ref >39)
Total CHOL/HDL Ratio: 2.5 Ratio
Triglycerides: 179 mg/dL — ABNORMAL HIGH (ref <150)
VLDL: 36 mg/dL (ref 0–40)

## 2011-07-02 LAB — POCT URINALYSIS DIPSTICK
Ketones, UA: NEGATIVE
Protein, UA: NEGATIVE
Spec Grav, UA: 1.005
pH, UA: 7

## 2011-07-02 LAB — TSH: TSH: 2.479 u[IU]/mL (ref 0.350–4.500)

## 2011-07-02 MED ORDER — TETANUS-DIPHTH-ACELL PERTUSSIS 5-2.5-18.5 LF-MCG/0.5 IM SUSP: 0.5000 mL | Freq: Once | INTRAMUSCULAR | Status: DC

## 2011-07-02 NOTE — Progress Notes (Signed)
Subjective:    Patient ID: Brenda Lara, female    DOB: 1951-07-23, 60 y.o.   MRN: [id number]  HPI  Brenda Lara is here for comprehensive eval.  She reports anxiety is much less and she only used 3 of Xanax.  Vertigo nearly resolved.  She practices Epley maneuver when it recurs which helps.    UTD with colonoscopy.  Not due for 2 years from now.  She has upcoming appt with oncologist in [place].  Had neg mammogram earlier this year.   She has had influenza vaccine but cannot recall last tetanus  No Known Allergies Past Medical History  Diagnosis Date  . HTN (hypertension)   . GERD (gastroesophageal reflux disease)   . Pure hypercholesterolemia   . Angina pectoris   . Menopause   . Anemia   . Anxiety   . Colon cancer 2010  . Coronary artery disease     S/P stenting to RCA and LAD   Past Surgical History  Procedure Date  . Coronary stent placement 2006    RCA  . Coronary stent placement 2006    RCA  . Back surgery 1978, 1988    multiple  . Cystectomy   . Colon surgery     right colectomy  . Bilateral oophorectomy 2010  . Appendectomy 02/21/2009  . Cholecystectomy 11/09   History   Social History  . Marital Status: Married    Spouse Name: N/A    Number of Children: N/A  . Years of Education: N/A   Occupational History  . Not on file.   Social History Main Topics  . Smoking status: Never Smoker   . Smokeless tobacco: Never Used  . Alcohol Use: 0.5 oz/week    1 drink(s) per week  . Drug Use: No  . Sexually Active: Yes    Birth Control/ Protection: Post-menopausal   Other Topics Concern  . Not on file   Social History Narrative  . No narrative on file   Family History  Problem Relation Age of Onset  . Coronary artery disease    . Heart attack Mother     34's  . Hypertension Mother   . Transient ischemic attack Mother   . Heart attack Father     16's  . Cancer Father     prostate  . Heart disease Father    Patient Active Problem List  Diagnoses    . HYPERCHOLESTEROLEMIA  . CONGENITAL FACTOR VIII DISORDER  . HYPERTENSION  . ANGINA, ATYPICAL  . CAD, NATIVE VESSEL  . GERD  . CHEST PAIN  . Anxiety  . Colon cancer  . Menopause  . Vertigo   Current Outpatient Prescriptions on File Prior to Visit  Medication Sig Dispense Refill  . ALPRAZolam (XANAX) 0.25 MG tablet Take one tablet twice a day as needed  10 tablet  2  . aspirin 325 MG tablet Take 325 mg by mouth daily.        Marland Kitchen atenolol (TENORMIN) 25 MG tablet Take 25 mg by mouth daily.        . clopidogrel (PLAVIX) 75 MG tablet Take 75 mg by mouth daily.        . Ergocalciferol (VITAMIN D2 PO) Take 1.25 mg by mouth. 3 tabs monthly      . FeFum-FePoly-FA-B Cmp-C-Biot (INTEGRA PLUS) CAPS Take 1 capsule by mouth daily.       . meclizine (ANTIVERT) 25 MG tablet Take 25 mg by mouth 4 (four) times daily as needed.        Marland Kitchen  omeprazole (PRILOSEC) 20 MG capsule Take 20 mg by mouth daily.        . simvastatin (ZOCOR) 40 MG tablet Take 1 tablet (40 mg total) by mouth every evening.  30 tablet  11           Review of Systems    no chest pain, no sob,  No LE edema. No dizziness or headache No blood in stool Objective:   Physical Exam  Physical Exam  Vital signs and nursing note reviewed  Constitutional: She is oriented to person, place, and time. She appears well-developed and well-nourished. She is cooperative.  HENT:  Head: Normocephalic and atraumatic.  Right Ear: Tympanic membrane normal.  Left Ear: Tympanic membrane normal.  Nose: Nose normal.  Mouth/Throat: Oropharynx is clear and moist and mucous membranes are normal. No oropharyngeal exudate or posterior oropharyngeal erythema.  Eyes: Conjunctivae and EOM are normal. Pupils are equal, round, and reactive to light.  Neck: Neck supple. No JVD present. Carotid bruit is not present. No mass and no thyromegaly present.  Cardiovascular: Regular rhythm, normal heart sounds, intact distal pulses and normal pulses.  Exam reveals  no gallop and no friction rub.   No murmur heard. Pulses:      Dorsalis pedis pulses are 2+ on the right side, and 2+ on the left side.  Pulmonary/Chest: Breath sounds normal. She has no wheezes. She has no rhonchi. She has no rales. Right breast exhibits no mass, no nipple discharge and no skin change. Left breast exhibits no mass, no nipple discharge and no skin change.  Abdominal: Soft. Bowel sounds are normal. She exhibits no distension and no mass. There is no hepatosplenomegaly. There is no tenderness. There is no CVA tenderness.  Genitourinary: Rectum normal, vagina normal and uterus normal. Rectal exam shows no mass. Guaiac negative stool. No labial fusion. There is no lesion on the right labia. There is no lesion on the left labia. Cervix exhibits no motion tenderness. Right adnexum displays no mass, no tenderness and no fullness. Left adnexum displays no mass, no tenderness and no fullness. No erythema around the vagina.  Musculoskeletal:       No active synovitis to any joint.    Lymphadenopathy:       Right cervical: No superficial cervical adenopathy present.      Left cervical: No superficial cervical adenopathy present.       Right axillary: No pectoral and no lateral adenopathy present.       Left axillary: No pectoral and no lateral adenopathy present.      Right: No inguinal adenopathy present.       Left: No inguinal adenopathy present.  Neurological: She is alert and oriented to person, place, and time. She has normal strength and normal reflexes. No cranial nerve deficit or sensory deficit. She displays a negative Romberg sign. Coordination and gait normal.  Skin: Skin is warm and dry. No abrasion, no bruising, no ecchymosis and no rash noted. No cyanosis. Nails show no clubbing.  Psychiatric: She has a normal mood and affect. Her speech is normal and behavior is normal.          Assessment & Plan:  1)  Health maintenance  Will give Tdap today.  Pap today  Advised to  see opthalmologist for glaucoma screen    Dash diet and shilngles vaccine infor given.  See HM sheet.  Will [facility] 2)  Vertigo  Nearly resolved 3)  Anxiety much  improved 4)  Colon CA  Does not need colonoscopy until 2014 5)  Hyperlipidemia will check today 6)  CAD  Dr. Eden Emms following  I spent 45 mins with this pt       Assessment & Plan:

## 2011-07-02 NOTE — Progress Notes (Signed)
Addended by: Chip Boer on: 07/02/2011 04:52 PM   Modules accepted: Orders

## 2011-07-02 NOTE — Patient Instructions (Signed)
Return prn  Keep appt with oncologist  Labs will be mailed to you

## 2011-07-08 ENCOUNTER — Encounter: Payer: Self-pay | Admitting: Emergency Medicine

## 2011-07-21 ENCOUNTER — Telehealth: Payer: Self-pay | Admitting: Internal Medicine

## 2011-07-21 NOTE — Telephone Encounter (Signed)
PC with pt.  She states that she has a "really bad cold" with a "horrific" sore throat.  Symptoms started Thursday and have only gotten worse.  She states she often gets bronchitis from colds and is concerned about getting it now.  Explained she would need to be seen to determine if she needed abx, we can not call in without being seen.  She is agreeable.

## 2011-07-21 NOTE — Telephone Encounter (Signed)
Pt states she has really bad cold with a bad cough; she wants to know if she can have something prescribe to her. She states she was not to long seen by the doctor. She can be reach at [phone]. Thanks

## 2011-07-22 ENCOUNTER — Ambulatory Visit (INDEPENDENT_AMBULATORY_CARE_PROVIDER_SITE_OTHER): Payer: 59 | Admitting: Internal Medicine

## 2011-07-22 ENCOUNTER — Encounter: Payer: Self-pay | Admitting: Internal Medicine

## 2011-07-22 VITALS — BP 121/67 | HR 73 | Temp 97.4°F | Resp 16 | Ht 61.5 in | Wt 149.0 lb

## 2011-07-22 DIAGNOSIS — J029 Acute pharyngitis, unspecified: Secondary | ICD-10-CM

## 2011-07-22 MED ORDER — AZITHROMYCIN 250 MG PO TABS: ORAL_TABLET | ORAL | Status: AC

## 2011-07-22 NOTE — Progress Notes (Signed)
Subjective:    Patient ID: Brenda Lara, female    DOB: [birth year], 60 y.o.   MRN: [id number]  HPI  [name] is here for acute visit.  Sore throat, cough nasal congestion last 5 days.  No fever  Cough dry no chest pain no SOB  No Known Allergies Past Medical History  Diagnosis Date  . HTN (hypertension)   . GERD (gastroesophageal reflux disease)   . Pure hypercholesterolemia   . Angina pectoris   . Menopause   . Anemia   . Anxiety   . Colon cancer 2010  . Coronary artery disease     S/P stenting to RCA and LAD   Past Surgical History  Procedure Date  . Coronary stent placement 2006    RCA  . Coronary stent placement 2006    RCA  . Back surgery 1978, 1988    multiple  . Cystectomy   . Colon surgery     right colectomy  . Bilateral oophorectomy 2010  . Appendectomy 02/21/2009  . Cholecystectomy 11/09   History   Social History  . Marital Status: Married    Spouse Name: N/A    Number of Children: N/A  . Years of Education: N/A   Occupational History  . Not on file.   Social History Main Topics  . Smoking status: Never Smoker   . Smokeless tobacco: Never Used  . Alcohol Use: 0.5 oz/week    1 drink(s) per week  . Drug Use: No  . Sexually Active: Yes    Birth Control/ Protection: Post-menopausal   Other Topics Concern  . Not on file   Social History Narrative  . No narrative on file   Family History  Problem Relation Age of Onset  . Coronary artery disease    . Heart attack Mother     33's  . Hypertension Mother   . Transient ischemic attack Mother   . Heart attack Father     39's  . Cancer Father     prostate  . Heart disease Father    Patient Active Problem List  Diagnoses  . HYPERCHOLESTEROLEMIA  . CONGENITAL FACTOR VIII DISORDER  . HYPERTENSION  . ANGINA, ATYPICAL  . CAD, NATIVE VESSEL  . GERD  . CHEST PAIN  . Anxiety  . Colon cancer  . Menopause  . Vertigo   Current Outpatient Prescriptions on File Prior to Visit  Medication  Sig Dispense Refill  . ALPRAZolam (XANAX) 0.25 MG tablet Take one tablet twice a day as needed  10 tablet  2  . aspirin 325 MG tablet Take 325 mg by mouth daily.        Marland Kitchen atenolol (TENORMIN) 25 MG tablet Take 25 mg by mouth daily.        . clopidogrel (PLAVIX) 75 MG tablet Take 75 mg by mouth daily.        . Ergocalciferol (VITAMIN D2 PO) Take 1.25 mg by mouth. 3 tabs monthly      . FeFum-FePoly-FA-B Cmp-C-Biot (INTEGRA PLUS) CAPS Take 1 capsule by mouth daily.       Marland Kitchen omeprazole (PRILOSEC) 20 MG capsule Take 20 mg by mouth daily.        . simvastatin (ZOCOR) 40 MG tablet Take 1 tablet (40 mg total) by mouth every evening.  30 tablet  11  . meclizine (ANTIVERT) 25 MG tablet Take 25 mg by mouth 4 (four) times daily as needed.         Current Facility-Administered Medications  on File Prior to Visit  Medication Dose Route Frequency Provider Last Rate Last Dose  . DISCONTD: TDaP (BOOSTRIX) injection 0.5 mL  0.5 mL Intramuscular Once Levon Hedger, MD            Review of Systems    see HPI Objective:   Physical Exam Physical Exam  Constitutional: She is oriented to person, place, and time. She appears well-developed and well-nourished. She is cooperative.  HENT:  Head: Normocephalic and atraumatic.  Right Ear: A middle ear effusion is present.  Left Ear: A middle ear effusion is present.  Nose: Mucosal edema present.  Mouth/Throat: Oropharyngeal exudate and posterior oropharyngeal erythema present.  Serous effusion bilaterally  Eyes: Conjunctivae and EOM are normal. Pupils are equal, round, and reactive to light.  Neck: Neck supple. Carotid bruit is not present. No mass present.  Cardiovascular: Regular rhythm, normal heart sounds, intact distal pulses and normal pulses. Exam reveals no gallop and no friction rub.  No murmur heard.  Pulmonary/Chest: Breath sounds normal. She has no wheezes. She has no rhonchi. She has no rales.  Lymphadenopathy:  She has cervical adenopathy.    Neurological: She is alert and oriented to person, place, and time.  Skin: Skin is warm and dry. No abrasion, no bruising, no ecchymosis and no rash noted. No cyanosis. Nails show no clubbing.  Psychiatric: She has a normal mood and affect. Her speech is normal and behavior is normal.       Assessment & Plan:  1)  Pharyngitis.  Rx Zpak, OTC afrin,  2)  Cough  Delsym OTC

## 2011-07-22 NOTE — Patient Instructions (Signed)
Brenda Lara was seen today.  She is to remain off work until Wednesday  Take medicine as prescribed  Delsym for cough  As directed  Afrin nasal spray for nose congestion as directed

## 2011-08-10 ENCOUNTER — Encounter: Payer: Self-pay | Admitting: Internal Medicine

## 2011-08-10 DIAGNOSIS — R9389 Abnormal findings on diagnostic imaging of other specified body structures: Secondary | ICD-10-CM | POA: Insufficient documentation

## 2011-10-07 ENCOUNTER — Telehealth: Payer: Self-pay | Admitting: Cardiovascular Disease

## 2011-10-07 MED ORDER — CLOPIDOGREL BISULFATE 75 MG PO TABS: 75.0000 mg | ORAL_TABLET | Freq: Every day | ORAL | Status: DC

## 2011-10-07 NOTE — Telephone Encounter (Signed)
Clopidogrel 75mg  refill needed, [facility] [place]

## 2011-10-10 ENCOUNTER — Other Ambulatory Visit: Payer: Self-pay | Admitting: Cardiovascular Disease

## 2011-10-10 MED ORDER — CLOPIDOGREL BISULFATE 75 MG PO TABS: 75.0000 mg | ORAL_TABLET | Freq: Every day | ORAL | Status: DC

## 2011-10-10 NOTE — Telephone Encounter (Signed)
Pt [facility]idogrel 75 [facility] [place] she said she has been out of med for three weeks

## 2011-10-10 NOTE — Telephone Encounter (Signed)
Fax Received. Refill Completed. Madie Cahn Chowoe (R.M.A)   

## 2011-10-14 ENCOUNTER — Other Ambulatory Visit: Payer: Self-pay | Admitting: Internal Medicine

## 2011-10-14 DIAGNOSIS — Z1231 Encounter for screening mammogram for malignant neoplasm of breast: Secondary | ICD-10-CM

## 2011-11-03 ENCOUNTER — Ambulatory Visit (HOSPITAL_BASED_OUTPATIENT_CLINIC_OR_DEPARTMENT_OTHER)
Admission: RE | Admit: 2011-11-03 | Discharge: 2011-11-03 | Disposition: A | Discharge status: Home / Self Care | Payer: 59 | Source: Ambulatory Visit | Attending: Internal Medicine | Admitting: Internal Medicine

## 2011-11-03 DIAGNOSIS — Z1231 Encounter for screening mammogram for malignant neoplasm of breast: Secondary | ICD-10-CM | POA: Insufficient documentation

## 2011-11-08 ENCOUNTER — Other Ambulatory Visit: Payer: Self-pay | Admitting: Internal Medicine

## 2011-11-25 ENCOUNTER — Ambulatory Visit (INDEPENDENT_AMBULATORY_CARE_PROVIDER_SITE_OTHER): Payer: 59 | Admitting: Cardiovascular Disease

## 2011-11-25 ENCOUNTER — Encounter: Payer: Self-pay | Admitting: Cardiovascular Disease

## 2011-11-25 DIAGNOSIS — E7800 Pure hypercholesterolemia, unspecified: Secondary | ICD-10-CM

## 2011-11-25 DIAGNOSIS — I10 Essential (primary) hypertension: Secondary | ICD-10-CM

## 2011-11-25 DIAGNOSIS — I2510 Atherosclerotic heart disease of native coronary artery without angina pectoris: Secondary | ICD-10-CM

## 2011-11-25 MED ORDER — CLOPIDOGREL BISULFATE 75 MG PO TABS: 75.0000 mg | ORAL_TABLET | Freq: Every day | ORAL | Status: DC

## 2011-11-25 MED ORDER — SIMVASTATIN 40 MG PO TABS: 40.0000 mg | ORAL_TABLET | Freq: Every evening | ORAL | Status: DC

## 2011-11-25 NOTE — Assessment & Plan Note (Signed)
Stable with no angina and good activity level.  Continue medical Rx  

## 2011-11-25 NOTE — Assessment & Plan Note (Signed)
New job at [employer].  Move to [place]  Hopefully will be better after she is settled.  Has some Xanax per primary

## 2011-11-25 NOTE — Progress Notes (Signed)
This is a 61 year old white female patient with history of coronary artery disease, status post stenting of the RCA in 2006 and LAD in 2005. She came into the hospital with chest pain and had repeat cardiac catheterization, May 31, 2009. This showed the patient's stents were widely patent and she had no other significant disease. Her ejection fraction was 55% with mild apical hypokinesis. She is doing well with no SSCP. I reviewed her labs from Dr Caprice Red in [place]. Date 11/02/09. Her HDL was 76, LDL 69 and other labs normal. She needs a refill on her Plavix.   ROS: Denies fever, malais, weight loss, blurry vision, decreased visual acuity, cough, sputum, SOB, hemoptysis, pleuritic pain, palpitaitons, heartburn, abdominal pain, melena, lower extremity edema, claudication, or rash.  All other systems reviewed and negative  General: Affect appropriate Healthy:  appears stated age HEENT: normal Neck supple with no adenopathy JVP normal no bruits no thyromegaly Lungs clear with no wheezing and good diaphragmatic motion Heart:  S1/S2 no murmur, no rub, gallop or click PMI normal Abdomen: benighn, BS positve, no tenderness, no AAA no bruit.  No HSM or HJR Distal pulses intact with no bruits No edema Neuro non-focal Skin warm and dry No muscular weakness   Current Outpatient Prescriptions  Medication Sig Dispense Refill  . ALPRAZolam (XANAX) 0.25 MG tablet Take one tablet twice a day as needed  10 tablet  2  . aspirin 325 MG tablet Take 325 mg by mouth daily.        Marland Kitchen atenolol (TENORMIN) 25 MG tablet TAKE 1 TABLET BY MOUTH EVERY DAY  30 tablet  5  . clopidogrel (PLAVIX) 75 MG tablet Take 1 tablet (75 mg total) by mouth daily.  30 tablet  2  . Ergocalciferol (VITAMIN D2 PO) Take 1.25 mg by mouth. 3 tabs monthly      . FeFum-FePoly-FA-B Cmp-C-Biot (INTEGRA PLUS) CAPS Take 1 capsule by mouth daily.       . meclizine (ANTIVERT) 25 MG tablet Take 25 mg by mouth 4 (four) times daily as needed.         Marland Kitchen omeprazole (PRILOSEC) 20 MG capsule TAKE 1 TABLET EVERY DAY  30 capsule  3  . simvastatin (ZOCOR) 40 MG tablet Take 1 tablet (40 mg total) by mouth every evening.  30 tablet  11    Allergies  Review of patient's allergies indicates no known allergies.  Electrocardiogram: 10/12  NSR rate 62 normal ECG  Assessment and Plan

## 2011-11-25 NOTE — Patient Instructions (Signed)
Your physician wants you to follow-up in: 6 months with dr nishan   You will receive a reminder letter in the mail two months in advance. If you don't receive a letter, please call our office to schedule the follow-up appointment. Your physician recommends that you continue on your current medications as directed. Please refer to the Current Medication list given to you today. 

## 2011-11-25 NOTE — Assessment & Plan Note (Signed)
Well controlled.  Continue current medications and low sodium Dash type diet.    

## 2011-11-25 NOTE — Assessment & Plan Note (Signed)
Cholesterol is at goal.  Continue current dose of statin and diet Rx.  No myalgias or side effects.  F/U  LFT's in 6 months. Lab Results  Component Value Date   LDLCALC 58 07/02/2011

## 2012-02-06 ENCOUNTER — Telehealth: Payer: Self-pay | Admitting: Hematology and Oncology

## 2012-02-06 NOTE — Telephone Encounter (Signed)
lmonvm for pt re calling me for appt w/LO 

## 2012-02-11 ENCOUNTER — Telehealth: Payer: Self-pay | Admitting: Hematology and Oncology

## 2012-02-11 NOTE — Telephone Encounter (Signed)
lmonvm for pt @ both home/cell re calling me for appt w/LO 2nd attempt

## 2012-02-12 ENCOUNTER — Telehealth: Payer: Self-pay | Admitting: Hematology and Oncology

## 2012-02-12 NOTE — Telephone Encounter (Signed)
S/w pt re appt for 7/2 w/LO. Date per pt. Late June/early July per pt

## 2012-03-08 ENCOUNTER — Telehealth: Payer: Self-pay | Admitting: Hematology and Oncology

## 2012-03-08 NOTE — Telephone Encounter (Signed)
Referred by Dr. Loraine Leriche T. Himmer Dx-Stg 2 Ca of [place].

## 2012-03-12 ENCOUNTER — Other Ambulatory Visit: Payer: Self-pay | Admitting: Internal Medicine

## 2012-03-16 ENCOUNTER — Ambulatory Visit: Payer: 59

## 2012-03-16 ENCOUNTER — Telehealth: Payer: Self-pay | Admitting: Hematology and Oncology

## 2012-03-16 ENCOUNTER — Encounter: Payer: Self-pay | Admitting: Hematology and Oncology

## 2012-03-16 ENCOUNTER — Ambulatory Visit (HOSPITAL_BASED_OUTPATIENT_CLINIC_OR_DEPARTMENT_OTHER): Payer: 59 | Admitting: Hematology and Oncology

## 2012-03-16 ENCOUNTER — Ambulatory Visit (HOSPITAL_BASED_OUTPATIENT_CLINIC_OR_DEPARTMENT_OTHER): Payer: 59 | Admitting: Lab

## 2012-03-16 VITALS — BP 171/92 | HR 60 | Temp 97.0°F | Ht 62.0 in | Wt 152.9 lb

## 2012-03-16 DIAGNOSIS — D539 Nutritional anemia, unspecified: Secondary | ICD-10-CM

## 2012-03-16 DIAGNOSIS — C189 Malignant neoplasm of colon, unspecified: Secondary | ICD-10-CM

## 2012-03-16 LAB — FERRITIN: Ferritin: 11 ng/mL (ref 10–291)

## 2012-03-16 LAB — CBC WITH DIFFERENTIAL/PLATELET
BASO%: 0.5 % (ref 0.0–2.0)
LYMPH%: 32.8 % (ref 14.0–49.7)
MCHC: 33.5 g/dL (ref 31.5–36.0)
MCV: 85.6 fL (ref 79.5–101.0)
MONO%: 9 % (ref 0.0–14.0)
Platelets: 268 10*3/uL (ref 145–400)
RBC: 4.6 10*6/uL (ref 3.70–5.45)

## 2012-03-16 LAB — COMPREHENSIVE METABOLIC PANEL
ALT: 22 U/L (ref 0–35)
Alkaline Phosphatase: 94 U/L (ref 39–117)
Glucose, Bld: 95 mg/dL (ref 70–99)
Sodium: 136 mEq/L (ref 135–145)
Total Bilirubin: 0.5 mg/dL (ref 0.3–1.2)
Total Protein: 6.9 g/dL (ref 6.0–8.3)

## 2012-03-16 LAB — VITAMIN B12: Vitamin B-12: 373 pg/mL (ref 211–911)

## 2012-03-16 LAB — IRON AND TIBC
Iron: 44 ug/dL (ref 42–145)
TIBC: 432 ug/dL (ref 250–470)
UIBC: 388 ug/dL (ref 125–400)

## 2012-03-16 NOTE — Progress Notes (Signed)
Patient came in this morning as a new patient and she has one insurance.I did explain to her the financial assistance and co-pay assistance program we offer here,she said that she would call if she need any assistance ,but right now she said that she is [place].

## 2012-03-16 NOTE — Progress Notes (Signed)
This office note has been dictated.

## 2012-03-16 NOTE — Progress Notes (Signed)
Dr.   Raechel Chute     -      Primary  @  [place]. Dr.   Charlton Haws       -       Cardiologist  @   [place].  [facility]  [place],  [place].  Cell    Phone      [phone].

## 2012-03-16 NOTE — Patient Instructions (Signed)
Brenda Lara  [id number]  [facility] Discharge Instructions  RECOMMENDATIONS MADE BY THE CONSULTANT AND ANY TEST RESULTS WILL BE SENT TO YOUR REFERRING DOCTOR.   EXAM FINDINGS BY MD TODAY AND SIGNS AND SYMPTOMS TO REPORT TO CLINIC OR PRIMARY MD:   Your current list of medications are: Current Outpatient Prescriptions  Medication Sig Dispense Refill  . aspirin 325 MG tablet Take 325 mg by mouth daily.        Marland Kitchen atenolol (TENORMIN) 25 MG tablet TAKE 1 TABLET BY MOUTH EVERY DAY  30 tablet  5  . clopidogrel (PLAVIX) 75 MG tablet Take 1 tablet (75 mg total) by mouth daily.  30 tablet  11  . Ergocalciferol (VITAMIN D2 PO) Take 1.25 mg by mouth. 3 tabs monthly      . FeFum-FePoly-FA-B Cmp-C-Biot (INTEGRA PLUS) CAPS Take 1 capsule by mouth daily.       . meclizine (ANTIVERT) 25 MG tablet Take 25 mg by mouth 4 (four) times daily as needed.        Marland Kitchen omeprazole (PRILOSEC) 20 MG capsule TAKE 1 TABLET EVERY DAY  30 capsule  5  . simvastatin (ZOCOR) 40 MG tablet Take 1 tablet (40 mg total) by mouth every evening.  30 tablet  11     INSTRUCTIONS GIVEN AND DISCUSSED:   SPECIAL INSTRUCTIONS/FOLLOW-UP:  See above.  I acknowledge that I have been informed and understand all the instructions given to me and received a copy. I do not have any more questions at this time, but understand that I may call the [facility] at [phone] during business hours should I have any further questions or need assistance in obtaining follow-up care.

## 2012-03-16 NOTE — Progress Notes (Signed)
Met with patient and explained role of RN navigator.  Resource brochure given along with contact phone numbers.  Patient denies need for dietician, SW, or other services at present time. Encouraged patient to call for assist as needed.  Patient verbalized comprehension and stated she would contact this RN if she has any barriers to her continued oncologic care.

## 2012-03-16 NOTE — Telephone Encounter (Signed)
appts made and printed for pt aom °

## 2012-03-22 NOTE — Progress Notes (Signed)
CC:   Brenda Lara, M.D. Brenda Lara. Brenda Emms, MD, [facility] Webb Silversmith, MD, Fax [phone]  IDENTIFYING STATEMENT:  The patient is a 61 year old woman seen at the request of Dr. Constance Goltz with colon cancer.  HISTORY OF PRESENT ILLNESS:  The patient presents to reestablish oncology cancer care, was previously receiving her oncology care in [place], [place].  She has relocated to the [place] area to be close to family for work issues.  Her oncology history is summarized as follows:  In 2010 she presented with a 23-month history of generalized abdominal discomfort that was associated with weight loss and nausea. She was diagnosed with colicystitis and ultimately had a cholecystectomy.  Her pain improved but then recurred several months later.  She described a dull ache in the right lower quadrant of the abdomen.  She was then felt to have an appendicitis and a CT scan was shown to have perirectal inflammation around the base of the appendix. It also showed a right and left ovarian cyst.  On 02/21/2009, she had exploratory laparoscopy that was converted to an open cystectomy when, at time of surgery, the wall of the cecum was found to be thickened.  A mass was seen along the distal cecum around the base of the appendix. Surgical pathology revealed an adenocarcinoma that was not mucinous measuring 4.5 cm in size seen to invade through the muscularis propria into pericolonic and peri-appendiceal adipose tissue and also seen to focally invade in disrupted serosal surface.  Proximal and distal margins were negative.  Microscopic tumor perforation was not present. There was no evidence of perineural or lymphovascular invasion.  Seven of the lymph nodes sampled were negative for carcinoma and the pathological stage at that time was a T3 N0.  Of note, the tumor was seen to involve the appendix and cecum near the base of the appendix with partial obliteration of the appendix with  extension into the subserosa for soft tissue.  The patient went on to receive a right hemicolectomy with bilateral oophorectomy on 04/02/2009.  Pathology at that time showed no evidence of malignancy within the specimen.  None of the 15 lymph nodes sampled had evidence of malignancy.  The omentum that was sampled was negative for carcinoma.  Both the right and left ovaries revealed benign cysts with no evidence of malignancy.  Adjuvant chemotherapy was not recommended but she did receive surveillance.  She was last seen by Dr. Jacelyn Lara early this year.  Her CEA was less than 0.5. Her last colonoscopy on October 10, 2010 performed in [place] was unremarkable per report.  The patient is also up-to-date with mammograms.  Feels well.  Has had no change in bowel function.  Denies rectal bleeding.  Remains on iron for anemia.  Has excellent energy levels.  Has GERD-like symptoms of which she takes omeprazole.  PAST MEDICAL HISTORY: 1. Hypertension. 2. Coronary artery disease status post stent in 2001 and 2005. 3. Hypercholesterolemia. 4. GERD. 5. History of anemia. 6. Cholecystectomy. 7. Status post right hemicolectomy with bilateral oophorectomy in     2010.  ALLERGIES:  None.  MEDICATIONS:  Atenolol 25 mg daily, Zocor 40 mg daily, Plavix 75 mg daily,  omeprazole 20 mg daily, Integra Plus 1 tablet daily, vitamin D 1.25 mg 2000 units 3 times a month and aspirin 325 mg daily.  SOCIAL HISTORY:  She is married with 3 children.  Denies alcohol, tobacco use.  She works with a [employer].  FAMILY HISTORY:  Negative for colon cancer.  Patient's mother had leukemia.  Her father had prostate cancer.  HEALTH MAINTENANCE:  She receives healthcare through Dr. Valeria Batman office.  Up-to-date with mammograms and colonoscopy.  Her next colonoscopy has been scheduled in 3 years.  REVIEW OF SYSTEMS:  Denies fever, chills, night sweats, anorexia, weight loss.  GI:  Denies nausea,  vomiting, abdominal pain, diarrhea, melena, hematochezia.  GU:  Denies dysuria, hematuria, nocturia, frequency. Cardiovascular:  Denies chest pain, PND, orthopnea, ankle swelling. Respiratory:  Denies cough, hemoptysis, wheeze, shortness of breath. Neurologic:  Denies headache, vision change, extremity weakness. Musculoskeletal:  Denies joint aches, muscle pains.  Skin:  No petechiae.  Rest of review of systems negative.  PHYSICAL EXAM:  The patient is a well-appearing, well-nourished woman in no distress.  Vitals:  Pulse 60, blood pressure 171/92, temperature 97, respirations 20, weight 152 pounds.  HEENT:  Head is atraumatic, normocephalic.  Sclerae anicteric.  Pupils equal, round, reactive to light.  Mouth moist without ulcerations, thrush, or lesions.  Neck: Supple.  Chest:  Clear to percussion and auscultation.  Cardiovascular: First and second heart sounds present with no added sounds or murmurs. Abdomen:  Soft, nontender.  No hepatomegaly.  Bowel sounds present. Extremities:  No edema.  Pulses present and symmetrical.  Lymph nodes: No palpable cervical, axillary, or inguinal adenopathy.  Skin:  No petechia.  CNS:  Nonfocal.  IMPRESSION AND PLAN:  Brenda Lara is a 61 year old woman diagnosed with a stage II adenocarcinoma of the cecum/appendix in June of 2010 in [place], [place].  She ultimately underwent a right hemicolectomy.  She did not require adjuvant therapy and has been undergoing surveillance since that time.  The patient will continue with surveillance.  I will have her return to lab to obtain a CBC with differential, comprehensive metabolic panel, and CEA level.  We will also obtain a baseline CT scan of the chest, abdomen, and pelvis.  She is up to date with colonoscopy.  If all results are unremarkable, we plan to continue surveillance with a followup visit in 9 months' time with blood work only to also include a CEA level.  She was reminded to continue to  adopt a healthy lifestyle with exercise, high-fiber and low- fat diet.  I am not quite sure why she is on oral iron but we will check an anemia panel and see what the results hold.  If she continues to show an anemia-type profile, I may recommend she see a GI specialist for evaluation and possible endoscopy as she has does have ongoing GERD-type symptoms.  I spent more than half the time discussing surveillance and coordinating care.  The patient had a number of questions which were all answered.  We will be telephoning her with upcoming results and she follows up in 9 months' time.    ______________________________ Laurice Record, M.D. LIO/MEDQ  D:  03/16/2012  T:  03/16/2012  Job:  [id number]

## 2012-03-25 ENCOUNTER — Encounter (HOSPITAL_COMMUNITY): Payer: Self-pay

## 2012-03-25 ENCOUNTER — Ambulatory Visit (HOSPITAL_COMMUNITY)
Admission: RE | Admit: 2012-03-25 | Discharge: 2012-03-25 | Disposition: A | Discharge status: Home / Self Care | Payer: 59 | Source: Ambulatory Visit | Attending: Hematology and Oncology | Admitting: Hematology and Oncology

## 2012-03-25 DIAGNOSIS — D539 Nutritional anemia, unspecified: Secondary | ICD-10-CM

## 2012-03-25 DIAGNOSIS — K7689 Other specified diseases of liver: Secondary | ICD-10-CM | POA: Insufficient documentation

## 2012-03-25 DIAGNOSIS — C189 Malignant neoplasm of colon, unspecified: Secondary | ICD-10-CM | POA: Insufficient documentation

## 2012-03-25 MED ORDER — IOHEXOL 300 MG/ML  SOLN
100.0000 mL | Freq: Once | INTRAMUSCULAR | Status: AC | PRN
  Administered 2012-03-25: 100 mL via INTRAVENOUS

## 2012-03-26 ENCOUNTER — Telehealth: Payer: Self-pay | Admitting: *Deleted

## 2012-03-26 ENCOUNTER — Telehealth: Payer: Self-pay | Admitting: Hematology and Oncology

## 2012-03-26 ENCOUNTER — Other Ambulatory Visit: Payer: Self-pay | Admitting: *Deleted

## 2012-03-26 DIAGNOSIS — C189 Malignant neoplasm of colon, unspecified: Secondary | ICD-10-CM

## 2012-03-26 NOTE — Telephone Encounter (Signed)
Spoke with pt on cell phone and informed pt re:  CT scan results fine.   Ferritin low .  Pt stated she would resume  Iron meds as instructed by her primary -  Pt had not been taking iron for a month  Due to  Rx   Out of  Date .  Informed pt also that  Vit B12  Level in the low normal.   Instructed pt to begin oral B12   1000 mcg daily  As per Dr. Lonell Face instructions. Informed pt also that since pt has hx of GERD;  Dr. Dalene Carrow would recommend  GI evaluation for upper endoscopy.   Pt stated she is taking Prilosec for her GERD.   Pt stated she did not think she would need GI workup at present.   Asked pt to think about GI evaluation again;  If pt decides to go forward with the workup,  And if pt would like,  Our office would be glad to assist pt with the referral.    Pt stated she would think about it and will let nurse know of her decision. Informed pt that a scheduler will contact pt with date and time for lab rechecked in  Nov.  As per md's instructions.    Pt voiced understanding.

## 2012-03-26 NOTE — Telephone Encounter (Signed)
Added lb for 11/1. S/w pt she is aware.

## 2012-05-15 ENCOUNTER — Other Ambulatory Visit: Payer: Self-pay | Admitting: Internal Medicine

## 2012-06-02 ENCOUNTER — Other Ambulatory Visit: Payer: Self-pay | Admitting: *Deleted

## 2012-06-02 MED ORDER — INTEGRA PLUS PO CAPS: 1.0000 | ORAL_CAPSULE | Freq: Every day | ORAL | Status: DC

## 2012-06-02 NOTE — Telephone Encounter (Signed)
Pt is also needing the vit D

## 2012-06-08 ENCOUNTER — Other Ambulatory Visit: Payer: Self-pay | Admitting: Internal Medicine

## 2012-06-09 ENCOUNTER — Telehealth: Payer: Self-pay | Admitting: *Deleted

## 2012-06-09 NOTE — Telephone Encounter (Signed)
Pt needs refill of Vit D

## 2012-06-09 NOTE — Telephone Encounter (Signed)
Left message regarding refill of Vit D awaiting return call

## 2012-06-15 ENCOUNTER — Telehealth: Payer: Self-pay | Admitting: *Deleted

## 2012-06-15 NOTE — Telephone Encounter (Signed)
Left message awaiting return call

## 2012-06-24 NOTE — Telephone Encounter (Signed)
Left message to schedule an appt to check Vit D levels before refilling meds

## 2012-07-16 ENCOUNTER — Other Ambulatory Visit: Payer: 59 | Admitting: Lab

## 2012-07-19 ENCOUNTER — Other Ambulatory Visit (HOSPITAL_BASED_OUTPATIENT_CLINIC_OR_DEPARTMENT_OTHER): Payer: 59 | Admitting: Lab

## 2012-07-19 DIAGNOSIS — C189 Malignant neoplasm of colon, unspecified: Secondary | ICD-10-CM

## 2012-07-19 LAB — CBC WITH DIFFERENTIAL/PLATELET
Basophils Absolute: 0 10*3/uL (ref 0.0–0.1)
EOS%: 0.9 % (ref 0.0–7.0)
LYMPH%: 26.6 % (ref 14.0–49.7)
MCH: 29 pg (ref 25.1–34.0)
MCV: 85.1 fL (ref 79.5–101.0)
MONO%: 9.3 % (ref 0.0–14.0)
Platelets: 252 10*3/uL (ref 145–400)
RBC: 4.57 10*6/uL (ref 3.70–5.45)
RDW: 14.3 % (ref 11.2–14.5)

## 2012-07-19 LAB — VITAMIN B12: Vitamin B-12: 887 pg/mL (ref 211–911)

## 2012-07-19 LAB — FERRITIN: Ferritin: 16 ng/mL (ref 10–291)

## 2012-07-21 ENCOUNTER — Other Ambulatory Visit: Payer: Self-pay | Admitting: *Deleted

## 2012-07-21 DIAGNOSIS — C189 Malignant neoplasm of colon, unspecified: Secondary | ICD-10-CM

## 2012-07-23 ENCOUNTER — Telehealth: Payer: Self-pay | Admitting: *Deleted

## 2012-07-23 NOTE — Telephone Encounter (Signed)
Called pt at home and left message on voice mail for pt to call nurse back for lab results.

## 2012-08-02 ENCOUNTER — Telehealth: Payer: Self-pay | Admitting: *Deleted

## 2012-08-02 ENCOUNTER — Other Ambulatory Visit: Payer: Self-pay | Admitting: *Deleted

## 2012-08-02 NOTE — Telephone Encounter (Signed)
Pt returned nurse's call.   Informed pt of Ferritin level 16 from labs 11/4 ;  2 months ago was 11.   Instructed pt that if pt is asymptomatic, continue as pt is doing.   Informed pt that iron and ferritin levels will be rechecked in April 2014.   Pt voiced understanding.

## 2012-08-02 NOTE — Telephone Encounter (Signed)
Called pt on cell phone and left message  On voice mail re:  Asked pt to call nurse back for lab results.

## 2012-09-01 ENCOUNTER — Telehealth: Payer: Self-pay | Admitting: Cardiovascular Disease

## 2012-09-01 NOTE — Telephone Encounter (Signed)
New Problem: ° ° ° °I called the patient and was unable to reach them. I left a message on their voicemail with my name, the reason I called, the name of their physician, and a number to call back to schedule their appointment. ° °

## 2012-09-26 ENCOUNTER — Other Ambulatory Visit: Payer: Self-pay | Admitting: Internal Medicine

## 2012-09-27 NOTE — Telephone Encounter (Signed)
Refill request

## 2012-10-06 ENCOUNTER — Other Ambulatory Visit: Payer: Self-pay | Admitting: Internal Medicine

## 2012-10-06 DIAGNOSIS — Z139 Encounter for screening, unspecified: Secondary | ICD-10-CM

## 2012-10-16 ENCOUNTER — Telehealth: Payer: Self-pay | Admitting: *Deleted

## 2012-10-16 NOTE — Telephone Encounter (Signed)
Per patient reassignment I have called and left a message with the patient's husband to have patient call me. I have given my number at [phone] to call today until 12pm on on Monday morning. I have canceled appt Dr. Dalene Carrow.    JMW

## 2012-10-18 ENCOUNTER — Telehealth: Payer: Self-pay | Admitting: *Deleted

## 2012-10-18 NOTE — Telephone Encounter (Signed)
I have attempted to contact the patient regarding her appts in April. I have left another message.  JMW

## 2012-10-19 ENCOUNTER — Encounter: Payer: Self-pay | Admitting: *Deleted

## 2012-10-19 ENCOUNTER — Telehealth: Payer: Self-pay | Admitting: *Deleted

## 2012-10-19 NOTE — Telephone Encounter (Signed)
Per patient reassignment I have contacted the patient. Patient aware that Dr. Dalene Carrow is no longer with the practice. New appts given.  J MW

## 2012-10-22 ENCOUNTER — Telehealth: Payer: Self-pay | Admitting: *Deleted

## 2012-10-22 ENCOUNTER — Encounter: Payer: Self-pay | Admitting: *Deleted

## 2012-10-22 NOTE — Telephone Encounter (Signed)
Patient me back, appt moved.  JMW

## 2012-10-22 NOTE — Telephone Encounter (Signed)
Per patient phone call I have called and left her a message to call me. Patietn needs to move 4/18 appt. JMW

## 2012-11-03 ENCOUNTER — Ambulatory Visit (HOSPITAL_BASED_OUTPATIENT_CLINIC_OR_DEPARTMENT_OTHER)
Admission: RE | Admit: 2012-11-03 | Discharge: 2012-11-03 | Disposition: A | Discharge status: Home / Self Care | Payer: 59 | Source: Ambulatory Visit | Attending: Internal Medicine | Admitting: Internal Medicine

## 2012-11-03 DIAGNOSIS — Z1231 Encounter for screening mammogram for malignant neoplasm of breast: Secondary | ICD-10-CM | POA: Insufficient documentation

## 2012-11-03 DIAGNOSIS — Z139 Encounter for screening, unspecified: Secondary | ICD-10-CM

## 2012-12-01 ENCOUNTER — Other Ambulatory Visit: Payer: Self-pay | Admitting: *Deleted

## 2012-12-01 ENCOUNTER — Other Ambulatory Visit: Payer: Self-pay | Admitting: Internal Medicine

## 2012-12-01 MED ORDER — CLOPIDOGREL BISULFATE 75 MG PO TABS: 75.0000 mg | ORAL_TABLET | Freq: Every day | ORAL | Status: DC

## 2012-12-01 MED ORDER — SIMVASTATIN 40 MG PO TABS: 40.0000 mg | ORAL_TABLET | Freq: Every evening | ORAL | Status: DC

## 2012-12-01 NOTE — Telephone Encounter (Signed)
Refill request

## 2012-12-01 NOTE — Telephone Encounter (Signed)
Brenda Lara  I have not seen this pt since 2012  I sent a 30 day supply of med to [facility].   Call her and give her a follow up appt to see me in office   Give her a 30 min visit since I have not seen her in so long  thanks

## 2012-12-02 NOTE — Telephone Encounter (Signed)
LVM regarding her 30 day supply of BP meds and the need for an appt awaiting return call

## 2012-12-09 ENCOUNTER — Encounter: Payer: Self-pay | Admitting: Internal Medicine

## 2012-12-09 ENCOUNTER — Ambulatory Visit (INDEPENDENT_AMBULATORY_CARE_PROVIDER_SITE_OTHER): Payer: 59 | Admitting: Internal Medicine

## 2012-12-09 ENCOUNTER — Encounter: Payer: Self-pay | Admitting: Cardiovascular Disease

## 2012-12-09 ENCOUNTER — Ambulatory Visit (INDEPENDENT_AMBULATORY_CARE_PROVIDER_SITE_OTHER): Payer: 59 | Admitting: Cardiovascular Disease

## 2012-12-09 VITALS — BP 175/89 | HR 73 | Ht 61.0 in | Wt 160.0 lb

## 2012-12-09 VITALS — BP 154/84 | HR 78 | Temp 97.6°F | Resp 16 | Ht 61.0 in | Wt 161.0 lb

## 2012-12-09 DIAGNOSIS — R519 Headache, unspecified: Secondary | ICD-10-CM | POA: Insufficient documentation

## 2012-12-09 DIAGNOSIS — Z79899 Other long term (current) drug therapy: Secondary | ICD-10-CM

## 2012-12-09 DIAGNOSIS — I10 Essential (primary) hypertension: Secondary | ICD-10-CM

## 2012-12-09 DIAGNOSIS — E7800 Pure hypercholesterolemia, unspecified: Secondary | ICD-10-CM

## 2012-12-09 DIAGNOSIS — I2510 Atherosclerotic heart disease of native coronary artery without angina pectoris: Secondary | ICD-10-CM

## 2012-12-09 DIAGNOSIS — E785 Hyperlipidemia, unspecified: Secondary | ICD-10-CM

## 2012-12-09 DIAGNOSIS — R51 Headache: Secondary | ICD-10-CM

## 2012-12-09 LAB — HEPATIC FUNCTION PANEL
ALT: 25 U/L (ref 0–35)
AST: 26 U/L (ref 0–37)
Alkaline Phosphatase: 88 U/L (ref 39–117)
Bilirubin, Direct: 0 mg/dL (ref 0.0–0.3)
Total Protein: 7.1 g/dL (ref 6.0–8.3)

## 2012-12-09 LAB — LIPID PANEL
HDL: 60.7 mg/dL (ref >39.00)
Total CHOL/HDL Ratio: 2

## 2012-12-09 MED ORDER — ATENOLOL 25 MG PO TABS: 25.0000 mg | ORAL_TABLET | Freq: Two times a day (BID) | ORAL | Status: DC

## 2012-12-09 MED ORDER — NITROGLYCERIN 0.4 MG SL SUBL: 0.4000 mg | SUBLINGUAL_TABLET | SUBLINGUAL | Status: DC | PRN

## 2012-12-09 MED ORDER — IBUPROFEN 800 MG PO TABS: 800.0000 mg | ORAL_TABLET | Freq: Three times a day (TID) | ORAL | Status: DC | PRN

## 2012-12-09 NOTE — Progress Notes (Signed)
Subjective:    Patient ID: Brenda Lara, female    DOB: July 27, 1951, 62 y.o.   MRN: [id number]  HPI Brenda Lara is here for acute visit.  She has been having increasing frequency of headaches.  Starts at back of neck and spreads upward until she feels a pounding pulsating type pain.  No visual change  No photophobia  No N/V  No FH of migraine.  She takes Alleve   Now having headaches about once a week.  Will wake her up from sleep around 3 am.    She was at cardioligst office this am and her BP was 179/85  She is on Atenolol 25 mg once a day  Neck pain has been longstanding.  No injury or trauma  No Known Allergies Past Medical History  Diagnosis Date  . HTN (hypertension)   . GERD (gastroesophageal reflux disease)   . Pure hypercholesterolemia   . Angina pectoris   . Menopause   . Anemia   . Anxiety   . Coronary artery disease     S/P stenting to RCA and LAD  . Colon cancer 2010   Past Surgical History  Procedure Laterality Date  . Coronary stent placement  2006    RCA  . Coronary stent placement  2006    RCA  . Back surgery  1978, 1988    multiple  . Cystectomy    . Colon surgery      right colectomy  . Bilateral oophorectomy  2010  . Appendectomy  02/21/2009  . Cholecystectomy  11/09   History   Social History  . Marital Status: Married    Spouse Name: N/A    Number of Children: N/A  . Years of Education: N/A   Occupational History  . Not on file.   Social History Main Topics  . Smoking status: Never Smoker   . Smokeless tobacco: Never Used  . Alcohol Use: 0.5 oz/week    1 drink(s) per week  . Drug Use: No  . Sexually Active: Yes    Birth Control/ Protection: Post-menopausal   Other Topics Concern  . Not on file   Social History Narrative  . No narrative on file   Family History  Problem Relation Age of Onset  . Coronary artery disease    . Heart attack Mother     72's  . Hypertension Mother   . Transient ischemic attack Mother   . Heart  attack Father     71's  . Cancer Father     prostate  . Heart disease Father    Patient Active Problem List  Diagnosis  . HYPERCHOLESTEROLEMIA  . CONGENITAL FACTOR VIII DISORDER  . HYPERTENSION  . ANGINA, ATYPICAL  . CAD, NATIVE VESSEL  . GERD  . CHEST PAIN  . Anxiety  . Colon cancer  . Menopause  . Vertigo  . Abnormal MRI  . Headache   Current Outpatient Prescriptions on File Prior to Visit  Medication Sig Dispense Refill  . aspirin 325 MG tablet Take 325 mg by mouth daily.        . clopidogrel (PLAVIX) 75 MG tablet Take 1 tablet (75 mg total) by mouth daily.  30 tablet  11  . FeFum-FePoly-FA-B Cmp-C-Biot (INTEGRA PLUS) CAPS Take 1 capsule by mouth daily.  90 capsule  1  . meclizine (ANTIVERT) 25 MG tablet Take 25 mg by mouth 4 (four) times daily as needed.        Marland Kitchen omeprazole (PRILOSEC) 20 MG  capsule TAKE 1 TABLET EVERY DAY  30 capsule  5  . simvastatin (ZOCOR) 40 MG tablet Take 1 tablet (40 mg total) by mouth every evening.  30 tablet  11  . nitroGLYCERIN (NITROSTAT) 0.4 MG SL tablet Place 1 tablet (0.4 mg total) under the tongue every 5 (five) minutes as needed for chest pain.  25 tablet  4   No current facility-administered medications on file prior to visit.      Review of Systems See HPI     Objective:   Physical Exam  Physical Exam  Nursing note and vitals reviewed.  Constitutional: She is oriented to person, place, and time. She appears well-developed and well-nourished.  HENT:  Head: Normocephalic and atraumatic.  Cardiovascular: Normal rate and regular rhythm. Exam reveals no gallop and no friction rub.  No murmur heard.  Pulmonary/Chest: Breath sounds normal. She has no wheezes. She has no rales.  Neurological: She is alert and oriented to person, place, and time.  CNII-XII intact Motor 5/5 UE and LE Reflexes 2+ symmetric Sensory intact to pp Cerebellar  Intact FTN Skin: Skin is warm and dry.  Psychiatric: She has a normal mood and affect. Her  behavior is normal.             Assessment & Plan:  Headache  Suspect uncontrolled Bp may be part of picture.  Will incfreast  Atenolol ot 25 mg bid.  See me in 10 days.   OK for Ibuprofen 800 mg bid prn  Neck pain  If no improvement willl need imaging

## 2012-12-09 NOTE — Assessment & Plan Note (Signed)
Well controlled.  Continue current medications and low sodium Dash type diet.    

## 2012-12-09 NOTE — Patient Instructions (Addendum)
See me in 10 days  Increase your atenolol to 25 mg bid

## 2012-12-09 NOTE — Assessment & Plan Note (Signed)
Labs today. Continue statin.  

## 2012-12-09 NOTE — Assessment & Plan Note (Signed)
Stable with no angina and good activity level.  Continue medical Rx  

## 2012-12-09 NOTE — Progress Notes (Signed)
Patient ID: EMSLEY CUSTER, female   DOB: 1951-07-06, 62 y.o.   MRN: [id number] This is a 62 year old white female patient with history of coronary artery disease, status post stenting of the RCA in 2006 and LAD in 2005. She came into the hospital with chest pain and had repeat cardiac catheterization, May 31, 2009. This showed the patient's stents were widely patent and she had no other significant disease. Her ejection fraction was 55% with mild apical hypokinesis. She is doing well with no SSCP. Needs lipids today ON statin  Having some back and epigastric pain.  Deb Schoenhoff is new primary ROS: Denies fever, malais, weight loss, blurry vision, decreased visual acuity, cough, sputum, SOB, hemoptysis, pleuritic pain, palpitaitons, heartburn, abdominal pain, melena, lower extremity edema, claudication, or rash.  All other systems reviewed and negative  General: Affect appropriate Healthy:  appears stated age HEENT: normal Neck supple with no adenopathy JVP normal no bruits no thyromegaly Lungs clear with no wheezing and good diaphragmatic motion Heart:  S1/S2 no murmur, no rub, gallop or click PMI normal Abdomen: benighn, BS positve, no tenderness, no AAA no bruit.  No HSM or HJR Distal pulses intact with no bruits No edema Neuro non-focal Skin warm and dry No muscular weakness   Current Outpatient Prescriptions  Medication Sig Dispense Refill  . aspirin 325 MG tablet Take 325 mg by mouth daily.        Marland Kitchen atenolol (TENORMIN) 25 MG tablet TAKE 1 TABLET BY MOUTH EVERY DAY  30 tablet  0  . clopidogrel (PLAVIX) 75 MG tablet Take 1 tablet (75 mg total) by mouth daily.  30 tablet  11  . FeFum-FePoly-FA-B Cmp-C-Biot (INTEGRA PLUS) CAPS Take 1 capsule by mouth daily.  90 capsule  1  . meclizine (ANTIVERT) 25 MG tablet Take 25 mg by mouth 4 (four) times daily as needed.        Marland Kitchen omeprazole (PRILOSEC) 20 MG capsule TAKE 1 TABLET EVERY DAY  30 capsule  5  . simvastatin (ZOCOR) 40 MG tablet  Take 1 tablet (40 mg total) by mouth every evening.  30 tablet  11   No current facility-administered medications for this visit.    Allergies  Review of patient's allergies indicates no known allergies.  Electrocardiogram:  NSR rate 73 normal  Assessment and Plan

## 2012-12-09 NOTE — Patient Instructions (Signed)
Your physician wants you to follow-up in: YEAR WITH DR Haywood Filler will receive a reminder letter in the mail two months in advance. If you don't receive a letter, please call our office to schedule the follow-up appointment. Your physician recommends that you continue on your current medications as directed. Please refer to the Current Medication list given to you today.  Your physician recommends that you return for lab work in: TODAY LIPID LIVER  DX 272.4 V58.69

## 2012-12-10 ENCOUNTER — Other Ambulatory Visit: Payer: 59 | Admitting: Lab

## 2012-12-14 ENCOUNTER — Ambulatory Visit: Payer: 59 | Admitting: Hematology and Oncology

## 2012-12-16 ENCOUNTER — Encounter: Payer: Self-pay | Admitting: Internal Medicine

## 2012-12-16 ENCOUNTER — Ambulatory Visit (INDEPENDENT_AMBULATORY_CARE_PROVIDER_SITE_OTHER): Payer: 59 | Admitting: Internal Medicine

## 2012-12-16 VITALS — BP 162/72 | HR 65 | Temp 97.5°F | Resp 18

## 2012-12-16 DIAGNOSIS — R51 Headache: Secondary | ICD-10-CM

## 2012-12-16 DIAGNOSIS — I10 Essential (primary) hypertension: Secondary | ICD-10-CM

## 2012-12-16 MED ORDER — ATENOLOL 25 MG PO TABS: 25.0000 mg | ORAL_TABLET | Freq: Two times a day (BID) | ORAL | Status: DC

## 2012-12-16 NOTE — Progress Notes (Signed)
Subjective:    Patient ID: Brenda Lara, female    DOB: September 13, 1951, 62 y.o.   MRN: [id number]  HPI Brenda Lara is here for follow up.  She has increased her Tenormin 25 mg bid and headaches improved somewhat.  She had one episode sat evening that did awaken her from sleep.    She bring recordings of BP at home.  Mose readings have systolic below 140    See MRI/MRA from 2012 Nonspecific white matter signal  That favors chronic small vessel disease  MRA negative.    Pt gives long history of years of headaches.   Pain seems to start in back of head and moves upward  She has had migraine meds in past but not sure if it helped  No Known Allergies Past Medical History  Diagnosis Date  . HTN (hypertension)   . GERD (gastroesophageal reflux disease)   . Pure hypercholesterolemia   . Angina pectoris   . Menopause   . Anemia   . Anxiety   . Coronary artery disease     S/P stenting to RCA and LAD  . Colon cancer 2010   Past Surgical History  Procedure Laterality Date  . Coronary stent placement  2006    RCA  . Coronary stent placement  2006    RCA  . Back surgery  1978, 1988    multiple  . Cystectomy    . Colon surgery      right colectomy  . Bilateral oophorectomy  2010  . Appendectomy  02/21/2009  . Cholecystectomy  11/09   History   Social History  . Marital Status: Married    Spouse Name: N/A    Number of Children: N/A  . Years of Education: N/A   Occupational History  . Not on file.   Social History Main Topics  . Smoking status: Never Smoker   . Smokeless tobacco: Never Used  . Alcohol Use: 0.5 oz/week    1 drink(s) per week  . Drug Use: No  . Sexually Active: Yes    Birth Control/ Protection: Post-menopausal   Other Topics Concern  . Not on file   Social History Narrative  . No narrative on file   Family History  Problem Relation Age of Onset  . Coronary artery disease    . Heart attack Mother     2's  . Hypertension Mother   . Transient ischemic  attack Mother   . Heart attack Father     11's  . Cancer Father     prostate  . Heart disease Father    Patient Active Problem List  Diagnosis  . HYPERCHOLESTEROLEMIA  . CONGENITAL FACTOR VIII DISORDER  . HYPERTENSION  . ANGINA, ATYPICAL  . CAD, NATIVE VESSEL  . GERD  . CHEST PAIN  . Anxiety  . Colon cancer  . Menopause  . Vertigo  . Abnormal MRI  . Headache   Current Outpatient Prescriptions on File Prior to Visit  Medication Sig Dispense Refill  . aspirin 325 MG tablet Take 325 mg by mouth daily.        . clopidogrel (PLAVIX) 75 MG tablet Take 1 tablet (75 mg total) by mouth daily.  30 tablet  11  . FeFum-FePoly-FA-B Cmp-C-Biot (INTEGRA PLUS) CAPS Take 1 capsule by mouth daily.  90 capsule  1  . ibuprofen (ADVIL,MOTRIN) 800 MG tablet Take 1 tablet (800 mg total) by mouth every 8 (eight) hours as needed for pain.  30 tablet  0  .  simvastatin (ZOCOR) 40 MG tablet Take 1 tablet (40 mg total) by mouth every evening.  30 tablet  11  . meclizine (ANTIVERT) 25 MG tablet Take 25 mg by mouth 4 (four) times daily as needed.        . nitroGLYCERIN (NITROSTAT) 0.4 MG SL tablet Place 1 tablet (0.4 mg total) under the tongue every 5 (five) minutes as needed for chest pain.  25 tablet  4  . omeprazole (PRILOSEC) 20 MG capsule TAKE 1 TABLET EVERY DAY  30 capsule  5   No current facility-administered medications on file prior to visit.       Review of Systems See HPI    Objective:   Physical Exam Physical Exam  Nursing note and vitals reviewed.  Constitutional: She is oriented to person, place, and time. She appears well-developed and well-nourished.  HENT:  Head: Normocephalic and atraumatic.  Cardiovascular: Normal rate and regular rhythm. Exam reveals no gallop and no friction rub.  No murmur heard.  Pulmonary/Chest: Breath sounds normal. She has no wheezes. She has no rales.  Neurological: She is alert and oriented to person, place, and time.  Skin: Skin is warm and dry.   Psychiatric: She has a normal mood and affect. Her behavior is normal.             Assessment & Plan:  Headache  Will refer to neurology  [facility]  HTN  Continue atenolol BID  Will refill

## 2012-12-17 ENCOUNTER — Telehealth: Payer: Self-pay | Admitting: Internal Medicine

## 2012-12-17 NOTE — Telephone Encounter (Signed)
Per Brenda Lara, with Dr. Santiago Lara,  she requested to send in pt's referral with paperwork... Requested was done and ask their office to inform us when the pt's appointment... Ad

## 2012-12-20 ENCOUNTER — Ambulatory Visit: Payer: 59 | Admitting: Internal Medicine

## 2012-12-21 ENCOUNTER — Telehealth: Payer: Self-pay | Admitting: Internal Medicine

## 2012-12-21 NOTE — Telephone Encounter (Signed)
Thanks Ardenia    Just show kim how to document in chart if you have not already done so

## 2012-12-21 NOTE — Telephone Encounter (Signed)
Pt will see Dr. Neale Burly on 04.17.14 at 315 pm... Pt is aware of appointment per [facility]... Ad

## 2012-12-24 ENCOUNTER — Other Ambulatory Visit (HOSPITAL_BASED_OUTPATIENT_CLINIC_OR_DEPARTMENT_OTHER): Payer: 59 | Admitting: Lab

## 2012-12-24 DIAGNOSIS — C189 Malignant neoplasm of colon, unspecified: Secondary | ICD-10-CM

## 2012-12-24 DIAGNOSIS — D539 Nutritional anemia, unspecified: Secondary | ICD-10-CM

## 2012-12-24 LAB — CBC WITH DIFFERENTIAL/PLATELET
BASO%: 0.3 % (ref 0.0–2.0)
Basophils Absolute: 0 10*3/uL (ref 0.0–0.1)
EOS%: 1.3 % (ref 0.0–7.0)
MCH: 27.4 pg (ref 25.1–34.0)
MCHC: 32.8 g/dL (ref 31.5–36.0)
MCV: 83.4 fL (ref 79.5–101.0)
MONO%: 9.7 % (ref 0.0–14.0)
RBC: 4.52 10*6/uL (ref 3.70–5.45)
RDW: 13.6 % (ref 11.2–14.5)

## 2012-12-24 LAB — COMPREHENSIVE METABOLIC PANEL (CC13)
AST: 22 U/L (ref 5–34)
Alkaline Phosphatase: 101 U/L (ref 40–150)
Glucose: 90 mg/dl (ref 70–99)
Sodium: 138 mEq/L (ref 136–145)
Total Bilirubin: 0.39 mg/dL (ref 0.20–1.20)
Total Protein: 6.8 g/dL (ref 6.4–8.3)

## 2012-12-24 LAB — CEA: CEA: <0.5 ng/mL (ref 0.0–5.0)

## 2012-12-27 ENCOUNTER — Telehealth: Payer: Self-pay | Admitting: Oncology

## 2012-12-27 NOTE — Telephone Encounter (Signed)
lvm for pt regarding to d/t change of est..Marland KitchenMarland Kitchen

## 2012-12-30 ENCOUNTER — Telehealth: Payer: Self-pay | Admitting: Oncology

## 2012-12-30 NOTE — Telephone Encounter (Signed)
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 Pt was scheduled for 4/25 and moved to 5/19 due to BS out of office. Pt cannot come 5/19 and called back to r/s. Per pt she can't come 5/19 but needs appt in May due to her deductible starts over in June. Pt given appt for 5/30 and comment added to appt not to move appt. Pt aware of new d/t.

## 2012-12-30 NOTE — Telephone Encounter (Signed)
Returned pt's call re r/s 4/25 appt. Per pt we all her to r/s 4/25 appt w/BS and she is trying to get back w/us to r/s. appt is already r/s for 5/19. lmonvm for pt re new d/t and mailed schedule.

## 2012-12-31 ENCOUNTER — Ambulatory Visit: Payer: 59 | Admitting: Oncology

## 2013-01-07 ENCOUNTER — Ambulatory Visit: Payer: 59 | Admitting: Oncology

## 2013-01-31 ENCOUNTER — Ambulatory Visit: Payer: 59 | Admitting: Oncology

## 2013-02-11 ENCOUNTER — Ambulatory Visit (HOSPITAL_BASED_OUTPATIENT_CLINIC_OR_DEPARTMENT_OTHER): Payer: 59 | Admitting: Oncology

## 2013-02-11 ENCOUNTER — Telehealth: Payer: Self-pay | Admitting: Oncology

## 2013-02-11 VITALS — BP 142/76 | HR 57 | Temp 98.0°F | Resp 18 | Ht 61.0 in | Wt 151.8 lb

## 2013-02-11 DIAGNOSIS — C180 Malignant neoplasm of cecum: Secondary | ICD-10-CM

## 2013-02-11 DIAGNOSIS — I2510 Atherosclerotic heart disease of native coronary artery without angina pectoris: Secondary | ICD-10-CM

## 2013-02-11 DIAGNOSIS — C189 Malignant neoplasm of colon, unspecified: Secondary | ICD-10-CM

## 2013-02-11 NOTE — Progress Notes (Signed)
   [facility]    OFFICE PROGRESS NOTE   INTERVAL HISTORY:   She returns as scheduled. She has been followed by Dr. Dalene Carrow since being diagnosed with stage II colon cancer in 2010. Brenda Lara feels well. Good appetite and energy level. No pain. No complaint. She last underwent a colonoscopy in January of 2012.  Objective:  Vital signs in last 24 hours:  Blood pressure 142/76, pulse 57, temperature 98 F (36.7 C), temperature source Oral, resp. rate 18, height 5\' 1"  (1.549 m), weight 151 lb 12.8 oz (68.856 kg), SpO2 100.00%.    HEENT: Neck without mass Lymphatics: No cervical, supraclavicular, axillary, or inguinal nodes Resp: Lungs clear bilaterally Cardio: Regular rate and rhythm GI: No hepatosplenomegaly, nontender, no mass Vascular: No leg edema   Lab Results:  Lab Results  Component Value Date   WBC 8.1 12/24/2012   HGB 12.4 12/24/2012   HCT 37.7 12/24/2012   MCV 83.4 12/24/2012   PLT 244 12/24/2012   ANC 5.7 CEA less than 0.5   Medications: I have reviewed the patient's current medications.  Assessment/Plan:  1. Stage II colon cancer (T3 N0) of the cecum/appendix, status post a right hemicolectomy and bilateral oophorectomy 04/02/2009. She underwent an initial appendectomy/cecum resection on 02/21/2009 confirming an adenocarcinoma of the cecum.  2. History of coronary artery disease  Disposition:  She remains in clinical remission from colon cancer. She is now 4 years out from diagnosis. She will return for an office visit and CEA in one year. Ms. Rohe last underwent a colonoscopy in [place] in January of 2012. We will refer her to gastroenterology for a colonoscopy in January of 2015.   Thornton Papas, MD  02/11/2013  4:41 PM

## 2013-02-11 NOTE — Telephone Encounter (Signed)
Gave pt appt for lab and MD for 2015, asked patient if she wants to draw labs before MD visit , tried to explain to her that result of lab will not come back same day as visit, she asked me if MD needs result for the visit, informed her that MD can call her for result. Called [facility] to schedule Brooke Dare with Dr. Marina Goodell , they want pt to get her record from [facility], gave pt fax number and all informations that she needs  for Dr. Lamar Sprinkles office, Aileen Fass will schdule as soon as records are faxed to them

## 2013-02-18 ENCOUNTER — Telehealth: Payer: Self-pay | Admitting: Oncology

## 2013-02-18 NOTE — Telephone Encounter (Signed)
Called pt and left message , reminded pt that chart needed from her former GI MD , Brenda Lara will not schedule without records, all informations were given to pt last time she was her

## 2013-03-27 ENCOUNTER — Other Ambulatory Visit: Payer: Self-pay | Admitting: Internal Medicine

## 2013-03-28 NOTE — Telephone Encounter (Signed)
Refill request

## 2013-05-23 ENCOUNTER — Other Ambulatory Visit: Payer: Self-pay | Admitting: Internal Medicine

## 2013-05-24 NOTE — Telephone Encounter (Signed)
Refill request

## 2013-07-26 ENCOUNTER — Other Ambulatory Visit: Payer: Self-pay | Admitting: Internal Medicine

## 2013-07-26 NOTE — Telephone Encounter (Signed)
Refill request

## 2013-09-25 ENCOUNTER — Other Ambulatory Visit: Payer: Self-pay | Admitting: Internal Medicine

## 2013-11-16 ENCOUNTER — Other Ambulatory Visit: Payer: Self-pay | Admitting: Internal Medicine

## 2013-11-16 DIAGNOSIS — Z1231 Encounter for screening mammogram for malignant neoplasm of breast: Secondary | ICD-10-CM

## 2013-11-21 ENCOUNTER — Other Ambulatory Visit: Payer: Self-pay | Admitting: Cardiovascular Disease

## 2013-11-21 ENCOUNTER — Other Ambulatory Visit: Payer: Self-pay | Admitting: Internal Medicine

## 2013-11-22 NOTE — Telephone Encounter (Signed)
Refill request

## 2013-12-05 ENCOUNTER — Ambulatory Visit (HOSPITAL_BASED_OUTPATIENT_CLINIC_OR_DEPARTMENT_OTHER)
Admission: RE | Admit: 2013-12-05 | Discharge: 2013-12-05 | Disposition: A | Discharge status: Home / Self Care | Payer: 59 | Source: Ambulatory Visit | Attending: Internal Medicine | Admitting: Internal Medicine

## 2013-12-05 ENCOUNTER — Ambulatory Visit (INDEPENDENT_AMBULATORY_CARE_PROVIDER_SITE_OTHER): Payer: 59 | Admitting: Internal Medicine

## 2013-12-05 ENCOUNTER — Encounter (HOSPITAL_BASED_OUTPATIENT_CLINIC_OR_DEPARTMENT_OTHER): Payer: Self-pay

## 2013-12-05 ENCOUNTER — Encounter: Payer: Self-pay | Admitting: Internal Medicine

## 2013-12-05 ENCOUNTER — Telehealth: Payer: Self-pay | Admitting: *Deleted

## 2013-12-05 ENCOUNTER — Other Ambulatory Visit: Payer: Self-pay | Admitting: *Deleted

## 2013-12-05 VITALS — BP 177/70 | HR 68 | Temp 98.3°F | Resp 18 | Wt 141.0 lb

## 2013-12-05 DIAGNOSIS — Z01810 Encounter for preprocedural cardiovascular examination: Secondary | ICD-10-CM | POA: Insufficient documentation

## 2013-12-05 DIAGNOSIS — C189 Malignant neoplasm of colon, unspecified: Secondary | ICD-10-CM | POA: Insufficient documentation

## 2013-12-05 DIAGNOSIS — R109 Unspecified abdominal pain: Secondary | ICD-10-CM

## 2013-12-05 DIAGNOSIS — Z1231 Encounter for screening mammogram for malignant neoplasm of breast: Secondary | ICD-10-CM

## 2013-12-05 LAB — COMPREHENSIVE METABOLIC PANEL
ALT: 13 U/L (ref 0–35)
AST: 20 U/L (ref 0–37)
Albumin: 4.2 g/dL (ref 3.5–5.2)
Alkaline Phosphatase: 84 U/L (ref 39–117)
BUN: 8 mg/dL (ref 6–23)
CO2: 23 meq/L (ref 19–32)
Calcium: 9.3 mg/dL (ref 8.4–10.5)
Chloride: 108 mEq/L (ref 96–112)
Creat: 0.63 mg/dL (ref 0.50–1.10)
GLUCOSE: 107 mg/dL — AB (ref 70–99)
Potassium: 4.7 mEq/L (ref 3.5–5.3)
SODIUM: 139 meq/L (ref 135–145)
TOTAL PROTEIN: 6.8 g/dL (ref 6.0–8.3)
Total Bilirubin: 0.4 mg/dL (ref 0.2–1.2)

## 2013-12-05 LAB — HEMOCCULT GUIAC POC 1CARD (OFFICE): FECAL OCCULT BLD: NEGATIVE

## 2013-12-05 LAB — CBC WITH DIFFERENTIAL/PLATELET
Basophils Absolute: 0 10*3/uL (ref 0.0–0.1)
Basophils Relative: 0 % (ref 0–1)
EOS PCT: 0 % (ref 0–5)
Eosinophils Absolute: 0 10*3/uL (ref 0.0–0.7)
HCT: 33 % — ABNORMAL LOW (ref 36.0–46.0)
HEMOGLOBIN: 10.6 g/dL — AB (ref 12.0–15.0)
LYMPHS ABS: 1.3 10*3/uL (ref 0.7–4.0)
LYMPHS PCT: 17 % (ref 12–46)
MCH: 24.5 pg — AB (ref 26.0–34.0)
MCHC: 32.1 g/dL (ref 30.0–36.0)
MCV: 76.4 fL — AB (ref 78.0–100.0)
Monocytes Absolute: 0.7 10*3/uL (ref 0.1–1.0)
Monocytes Relative: 9 % (ref 3–12)
Neutro Abs: 5.7 10*3/uL (ref 1.7–7.7)
Neutrophils Relative %: 74 % (ref 43–77)
PLATELETS: 282 10*3/uL (ref 150–400)
RBC: 4.32 MIL/uL (ref 3.87–5.11)
RDW: 14.2 % (ref 11.5–15.5)
WBC: 7.7 10*3/uL (ref 4.0–10.5)

## 2013-12-05 LAB — POCT URINALYSIS DIPSTICK
BILIRUBIN UA: NEGATIVE
GLUCOSE UA: NEGATIVE
KETONES UA: NEGATIVE
Leukocytes, UA: NEGATIVE
Nitrite, UA: NEGATIVE
Protein, UA: NEGATIVE
RBC UA: NEGATIVE
SPEC GRAV UA: 1.01
Urobilinogen, UA: NEGATIVE
pH, UA: 7

## 2013-12-05 LAB — AMYLASE: Amylase: 47 U/L (ref 0–105)

## 2013-12-05 MED ORDER — IOHEXOL 300 MG/ML  SOLN
100.0000 mL | Freq: Once | INTRAMUSCULAR | Status: AC | PRN
  Administered 2013-12-05: 100 mL via INTRAVENOUS

## 2013-12-05 NOTE — Addendum Note (Signed)
Addended by: Conley Rolls on: 12/05/2013 02:03 PM   Modules accepted: Orders

## 2013-12-05 NOTE — Telephone Encounter (Signed)
Returned pt call regarding her recent imaging and pain control. Advised pt to use ibuprofen and tylenol and that Dr Coralyn Mark would contact her with CT results

## 2013-12-05 NOTE — Progress Notes (Addendum)
Subjective:    Patient ID: Brenda Lara, female    DOB: 07/24/1951, 63 y.o.   MRN: [id number]  HPI  Brenda Lara is here for acute visit.   She is S/P Right hemicolectomy for colon CA  And has 3 days of lower abd pain  "soreness"  Began on left side 3 days ago and then radiated to both sides.  No dysuria,  No Diarrhea,  Mild nausea no vomiting no vaginal discharge.    No dark or bloody stools  See MRI of 10/2010.  Performed for severe headache and vertigo and pt went to ER visit.   She has occasional migraines responding well with topamax.  No further vertigo.    No Known Allergies Past Medical History  Diagnosis Date  . HTN (hypertension)   . GERD (gastroesophageal reflux disease)   . Pure hypercholesterolemia   . Angina pectoris   . Menopause   . Anemia   . Anxiety   . Coronary artery disease     S/P stenting to RCA and LAD  . Colon cancer 2010   Past Surgical History  Procedure Laterality Date  . Coronary stent placement  2006    RCA  . Coronary stent placement  2006    RCA  . Back surgery  1978, 1988    multiple  . Cystectomy    . Colon surgery      right colectomy  . Bilateral oophorectomy  2010  . Appendectomy  02/21/2009  . Cholecystectomy  11/09   History   Social History  . Marital Status: Married    Spouse Name: N/A    Number of Children: N/A  . Years of Education: N/A   Occupational History  . Not on file.   Social History Main Topics  . Smoking status: Never Smoker   . Smokeless tobacco: Never Used  . Alcohol Use: 0.5 oz/week    1 drink(s) per week  . Drug Use: No  . Sexual Activity: Yes    Birth Control/ Protection: Post-menopausal   Other Topics Concern  . Not on file   Social History Narrative  . No narrative on file   Family History  Problem Relation Age of Onset  . Coronary artery disease    . Heart attack Mother     7's  . Hypertension Mother   . Transient ischemic attack Mother   . Heart attack Father     43's  . Cancer  Father     prostate  . Heart disease Father    Patient Active Problem List   Diagnosis Date Noted  . Headache 12/09/2012  . Abnormal MRI 08/10/2011  . Vertigo 05/29/2011  . Anxiety   . Colon cancer   . Menopause   . CAD, NATIVE VESSEL 06/13/2009  . CONGENITAL FACTOR VIII DISORDER 05/30/2009  . CHEST PAIN 05/30/2009  . HYPERCHOLESTEROLEMIA 12/05/2008  . HYPERTENSION 12/05/2008  . ANGINA, ATYPICAL 12/05/2008  . GERD 12/05/2008   Current Outpatient Prescriptions on File Prior to Visit  Medication Sig Dispense Refill  . aspirin 325 MG tablet Take 325 mg by mouth daily.        Marland Kitchen atenolol (TENORMIN) 25 MG tablet TAKE 1 TABLET (25 MG TOTAL) BY MOUTH 2 (TWO) TIMES DAILY.  180 tablet  1  . baclofen (LIORESAL) 10 MG tablet 10 mg as needed.       . clopidogrel (PLAVIX) 75 MG tablet TAKE 1 TABLET (75 MG TOTAL) BY MOUTH DAILY.  30 tablet  0  . FeFum-FePoly-FA-B Cmp-C-Biot (INTEGRA PLUS) CAPS Take 1 capsule by mouth daily.  90 capsule  1  . meclizine (ANTIVERT) 25 MG tablet Take 25 mg by mouth 4 (four) times daily as needed.        . nitroGLYCERIN (NITROSTAT) 0.4 MG SL tablet Place 1 tablet (0.4 mg total) under the tongue every 5 (five) minutes as needed for chest pain.  25 tablet  4  . omeprazole (PRILOSEC) 20 MG capsule TAKE ONE CAPSULE BY MOUTH EVERY DAY  90 capsule  0  . simvastatin (ZOCOR) 40 MG tablet TAKE 1 TABLET (40 MG TOTAL) BY MOUTH EVERY EVENING.  30 tablet  0  . topiramate (TOPAMAX) 100 MG tablet 100 mg daily.        No current facility-administered medications on file prior to visit.      Review of Systems    see HPI Objective:   Physical Exam Physical Exam  Nursing note and vitals reviewed.  Constitutional: She is oriented to person, place, and time. She appears well-developed and well-nourished.  HENT:  Head: Normocephalic and atraumatic.  Cardiovascular: Normal rate and regular rhythm. Exam reveals no gallop and no friction rub.  No murmur heard.  Pulmonary/Chest:  Breath sounds normal. She has no wheezes. She has no rales.  Abd:  BS+ No HSM.  Tenderness in both lower quadrants  Left greater than right.  Rectal no mass guaiac neg.  Neurological: She is alert and oriented to person, place, and time.  Skin: Skin is warm and dry.  Psychiatric: She has a normal mood and affect. Her behavior is normal.        Assessment & Plan:  Lower abd pain   U/A today neg.  Will get CT with contrast  Check amylase, labs today  See me next week  Colon CA  S/P  R hemicolectomy has upcoming appt with Dr. Benay Spice.   I reveiwed finding with pt.  Will get MRI in near future.  Pt to see me Monday.    Addendum  3/25  Spoke with pt and informed of CT results - no obvious cause for her pain  She is S/P  Bilateral oopherectomy,  Appendectomy,  And cholecystctomy   See labs her HGB had dropped nearly 2 gms since in 11 months.  She did stop taking her oral FE supplement.   Pt tells me that  Dr. Benay Spice did refer her to a GI MD for repeat colonoscopy earlier this year but she has not been there as yet.  Advised to make GI appt and notify me of name of MD so I will send records of anemia.  ADvised pt to keep her follow up appt with me.    Addend  3/25 1345: pt called back and she has appt with Dr. Scarlette Shorts

## 2013-12-05 NOTE — Patient Instructions (Signed)
See me 8 am Monday morning  To xray and to lab today

## 2013-12-06 ENCOUNTER — Telehealth: Payer: Self-pay | Admitting: *Deleted

## 2013-12-06 NOTE — Telephone Encounter (Signed)
Message copied by Conley Rolls on Tue Dec 06, 2013  3:29 PM ------      Message from: Emi Belfast D      Created: Tue Dec 06, 2013  2:21 PM       [facility]            Call pt and let her know that   There is no mass or signs of infection on her CT report.   Advised her to keep her follow up with me next monday ------

## 2013-12-06 NOTE — Telephone Encounter (Signed)
Notified pt of CT results.  

## 2013-12-07 ENCOUNTER — Telehealth: Payer: Self-pay | Admitting: *Deleted

## 2013-12-07 ENCOUNTER — Encounter: Payer: Self-pay | Admitting: Internal Medicine

## 2013-12-07 DIAGNOSIS — D649 Anemia, unspecified: Secondary | ICD-10-CM | POA: Insufficient documentation

## 2013-12-07 MED ORDER — INTEGRA PLUS PO CAPS: 1.0000 | ORAL_CAPSULE | Freq: Every day | ORAL | Status: DC

## 2013-12-07 NOTE — Telephone Encounter (Signed)
Called to inquire who the GI physician Dr. Benay Spice suggested she see for colonoscopy back in May 2014. She is ready to follow up on this now. Gave her name Scarlette Shorts with [facility]. She will call to schedule.

## 2013-12-07 NOTE — Addendum Note (Signed)
Addended by: Emi Belfast D on: 12/07/2013 11:20 AM   Modules accepted: Orders

## 2013-12-08 ENCOUNTER — Encounter: Payer: Self-pay | Admitting: Internal Medicine

## 2013-12-09 ENCOUNTER — Ambulatory Visit (HOSPITAL_BASED_OUTPATIENT_CLINIC_OR_DEPARTMENT_OTHER): Payer: 59

## 2013-12-12 ENCOUNTER — Encounter: Payer: Self-pay | Admitting: Internal Medicine

## 2013-12-12 ENCOUNTER — Ambulatory Visit (INDEPENDENT_AMBULATORY_CARE_PROVIDER_SITE_OTHER): Payer: 59 | Admitting: Internal Medicine

## 2013-12-12 VITALS — BP 130/68 | HR 58 | Temp 98.2°F | Resp 18 | Wt 143.0 lb

## 2013-12-12 DIAGNOSIS — D649 Anemia, unspecified: Secondary | ICD-10-CM

## 2013-12-12 DIAGNOSIS — R51 Headache: Secondary | ICD-10-CM

## 2013-12-12 DIAGNOSIS — R109 Unspecified abdominal pain: Secondary | ICD-10-CM

## 2013-12-12 MED ORDER — HYOSCYAMINE SULFATE ER 0.375 MG PO TB12: 0.3750 mg | ORAL_TABLET | Freq: Two times a day (BID) | ORAL | Status: DC

## 2013-12-12 NOTE — Progress Notes (Signed)
Subjective:    Patient ID: Brenda Lara, female    DOB: 03-27-1951, 63 y.o.   MRN: [id number]  HPI Brenda Lara is here for follow up.  She states she still has lower abd pain but not as severe and it seems to be associated with eating at times.  CT unrevealing for etiology.  She is S/P  Cholecystectomy, BSO, and R hemicolectomy.  Bm's normal and she is post menopausal   She has upcoming appt with Dr. Henrene Pastor for colonoscopy  but not until May.    NO N/V no diarrhea.  She denies dyspeptic symptoms.  No vaginal discharge  See labs  She has stopped taking her Integra and she is now anemic.  She reports long history of anemia prior to moving to [place].  She has been off Integra now for 2-3 months.    Headaches:  Brenda Lara has seen Dr. Domingo Cocking who is controlling her headaches with Topamax 100 mg daily - she reports much improvement in symptoms.  Has been on Topamax for a few months less than one year  No Known Allergies Past Medical History  Diagnosis Date  . HTN (hypertension)   . GERD (gastroesophageal reflux disease)   . Pure hypercholesterolemia   . Angina pectoris   . Menopause   . Anemia   . Anxiety   . Coronary artery disease     S/P stenting to RCA and LAD  . Colon cancer 2010   Past Surgical History  Procedure Laterality Date  . Coronary stent placement  2006    RCA  . Coronary stent placement  2006    RCA  . Back surgery  1978, 1988    multiple  . Cystectomy    . Colon surgery      right colectomy  . Bilateral oophorectomy  2010  . Appendectomy  02/21/2009  . Cholecystectomy  11/09   History   Social History  . Marital Status: Married    Spouse Name: N/A    Number of Children: N/A  . Years of Education: N/A   Occupational History  . Not on file.   Social History Main Topics  . Smoking status: Never Smoker   . Smokeless tobacco: Never Used  . Alcohol Use: 0.5 oz/week    1 drink(s) per week  . Drug Use: No  . Sexual Activity: Yes    Birth Control/ Protection:  Post-menopausal   Other Topics Concern  . Not on file   Social History Narrative  . No narrative on file   Family History  Problem Relation Age of Onset  . Coronary artery disease    . Heart attack Mother     54's  . Hypertension Mother   . Transient ischemic attack Mother   . Heart attack Father     49's  . Cancer Father     prostate  . Heart disease Father    Patient Active Problem List   Diagnosis Date Noted  . Anemia 12/07/2013  . Headache 12/09/2012  . Abnormal MRI 08/10/2011  . Vertigo 05/29/2011  . Anxiety   . Colon cancer   . Menopause   . CAD, NATIVE VESSEL 06/13/2009  . CONGENITAL FACTOR VIII DISORDER 05/30/2009  . CHEST PAIN 05/30/2009  . HYPERCHOLESTEROLEMIA 12/05/2008  . HYPERTENSION 12/05/2008  . ANGINA, ATYPICAL 12/05/2008  . GERD 12/05/2008   Current Outpatient Prescriptions on File Prior to Visit  Medication Sig Dispense Refill  . aspirin 325 MG tablet Take 325 mg by mouth  daily.        . atenolol (TENORMIN) 25 MG tablet TAKE 1 TABLET (25 MG TOTAL) BY MOUTH 2 (TWO) TIMES DAILY.  180 tablet  1  . baclofen (LIORESAL) 10 MG tablet 10 mg as needed.       . clopidogrel (PLAVIX) 75 MG tablet TAKE 1 TABLET (75 MG TOTAL) BY MOUTH DAILY.  30 tablet  0  . FeFum-FePoly-FA-B Cmp-C-Biot (INTEGRA PLUS) CAPS Take 1 capsule by mouth daily.  90 capsule  1  . meclizine (ANTIVERT) 25 MG tablet Take 25 mg by mouth 4 (four) times daily as needed.        . nitroGLYCERIN (NITROSTAT) 0.4 MG SL tablet Place 1 tablet (0.4 mg total) under the tongue every 5 (five) minutes as needed for chest pain.  25 tablet  4  . omeprazole (PRILOSEC) 20 MG capsule TAKE ONE CAPSULE BY MOUTH EVERY DAY  90 capsule  0  . simvastatin (ZOCOR) 40 MG tablet TAKE 1 TABLET (40 MG TOTAL) BY MOUTH EVERY EVENING.  30 tablet  0  . topiramate (TOPAMAX) 100 MG tablet 100 mg daily.        No current facility-administered medications on file prior to visit.      Review of Systems See HPI      Objective:   Physical Exam  Physical Exam  Nursing note and vitals reviewed.  Constitutional: She is oriented to person, place, and time. She appears well-developed and well-nourished.  HENT:  Head: Normocephalic and atraumatic.  Cardiovascular: Normal rate and regular rhythm. Exam reveals no gallop and no friction rub.  No murmur heard.  Pulmonary/Chest: Breath sounds normal. She has no wheezes. She has no rales.  Neurological: She is alert and oriented to person, place, and time.  Skin: Skin is warm and dry.  Psychiatric: She has a normal mood and affect. Her behavior is normal.             Assessment & Plan:  Lower Abd pain:  Etiology unclear at this point.  Topamax reported to be associated with abd pain in 10% of pts.  Will have pt decrease dose to 50 mg daily and speak to Dr. Domingo Cocking about coming off med to see if abd pain subsides.  She will call neuroogy office today.  GI appt pending.   If Topamax not the culprit OK to try Levbid temporarily until GI evaluation.  She is to call me in one week when off Topamax.  Counseled if any worsening pain, or fever, N/V she is to go for ER eval  Abnormal MRI:  Nonspecific white matter signal in 2/8/  2012 MRA neg.  No new symptoms.  She will discuss with Dr. Domingo Cocking at their next visit    Anemia   Pt has restarted her Integra  She reports chronic anemia.  She does not have menses  Headaches   Improved on Topamax  But will give a trial of coming off to see if this willl affect abd pain

## 2013-12-13 NOTE — Patient Instructions (Signed)
Call me in one week  Ok to decrease Topamax to 50 mg Discuss with neurologist about coming off ned

## 2013-12-29 ENCOUNTER — Other Ambulatory Visit: Payer: Self-pay

## 2013-12-29 MED ORDER — CLOPIDOGREL BISULFATE 75 MG PO TABS: ORAL_TABLET | ORAL | Status: DC

## 2013-12-29 MED ORDER — SIMVASTATIN 40 MG PO TABS: ORAL_TABLET | ORAL | Status: DC

## 2014-01-12 ENCOUNTER — Ambulatory Visit (AMBULATORY_SURGERY_CENTER): Payer: Self-pay

## 2014-01-12 VITALS — Ht 61.0 in | Wt 120.0 lb

## 2014-01-12 DIAGNOSIS — Z85038 Personal history of other malignant neoplasm of large intestine: Secondary | ICD-10-CM

## 2014-01-12 MED ORDER — MOVIPREP 100 G PO SOLR: 1.0000 | Freq: Once | ORAL | Status: DC

## 2014-01-12 NOTE — Progress Notes (Signed)
No allergies to eggs or soy. No home oxygen. No diet/weight loss meds. Hypotension episode s/p general anesthesia.  Resolved quickly in recovery.  Discharged home that day. Has email. Emmi instructions given for colonoscopy.

## 2014-01-17 ENCOUNTER — Encounter: Payer: Self-pay | Admitting: Physician Assistant

## 2014-01-17 ENCOUNTER — Ambulatory Visit (INDEPENDENT_AMBULATORY_CARE_PROVIDER_SITE_OTHER): Payer: 59 | Admitting: Physician Assistant

## 2014-01-17 ENCOUNTER — Telehealth: Payer: Self-pay | Admitting: *Deleted

## 2014-01-17 VITALS — BP 138/78 | HR 60 | Ht 61.0 in | Wt 141.0 lb

## 2014-01-17 DIAGNOSIS — Z85038 Personal history of other malignant neoplasm of large intestine: Secondary | ICD-10-CM

## 2014-01-17 DIAGNOSIS — Z7901 Long term (current) use of anticoagulants: Secondary | ICD-10-CM

## 2014-01-17 MED ORDER — ONDANSETRON HCL 4 MG PO TABS: ORAL_TABLET | ORAL | Status: DC

## 2014-01-17 NOTE — Telephone Encounter (Signed)
/  01/2014   RE: Brenda Lara DOB: 03/05/1951 MRN: [id number]   Dear Dr. Jenkins Rouge,    We have scheduled the above patient for an endoscopic procedure. Our records show that she is on anticoagulation therapy.   Please advise as to how long the patient may come off her therapy of Plavix prior to the procedure, which is scheduled for 01-26-2014. We recommend the patient stay off the Plavix for 5 days prior to the procedure unless otherwise instructed by their cardiologist.  Please fax back/ or route the completed form to [place] at [phone].   Sincerely,    Amy Henderson PA-C    Precious Gilding

## 2014-01-17 NOTE — Progress Notes (Signed)
Subjective:    Patient ID: Brenda Lara, female    DOB: 1951-07-31, 63 y.o.   MRN: [id number]  HPI  Brenda Lara is a pleasant 63 year old white female new to GI today referred by Dr. Benay Spice  for colonoscopy . Patient has history of a stage II adenocarcinoma of the colon diagnosed in 2010. She is status post right hemicolectomy. She did not require any adjuvant therapy. Her surgery was done in [place] .Marland Kitchen She had followup colonoscopy done in 2012 ,[place] ,[place] by Dr. Kirk Ruths. End to side ileocolonic anastomosis benign and no polyps noted she did have 1 mild erosion at the anastomosis. She is due for followup colonoscopy at this time. Currently she has no active symptoms but says she did have some lower abdominal  soreness in March of 2015 across her lower abdomen which lasted for a couple of weeks. Shesays she had pain with walking.She been seen by primary care and underwent CT scan of the abdomen and pelvis which was negative. She says her symptoms subsided and she's not had any difficulty since. Her bowel movements are normal she not noted any melena or hematochezia.  Patient does have history of coronary artery disease is status post coronary stenting 2001 and again in 2005. She is followed by Dr. Johnsie Cancel ,and maintained on Plavix. She is also status post appendectomy, cholecystectomy, has history of migraines , hypertension and GERD.    Review of Systems  Constitutional: Negative.   HENT: Negative.   Eyes: Negative.   Respiratory: Negative.   Cardiovascular: Negative.   Gastrointestinal: Negative.   Endocrine: Negative.   Genitourinary: Negative.   Musculoskeletal: Negative.   Allergic/Immunologic: Negative.   Neurological: Negative.   Hematological: Negative.   Psychiatric/Behavioral: Negative.    Outpatient Prescriptions Prior to Visit  Medication Sig Dispense Refill  . aspirin 325 MG tablet Take 325 mg by mouth daily.        Marland Kitchen atenolol (TENORMIN) 25 MG tablet TAKE 1  TABLET (25 MG TOTAL) BY MOUTH 2 (TWO) TIMES DAILY.  180 tablet  1  . baclofen (LIORESAL) 10 MG tablet 10 mg as needed.       . clopidogrel (PLAVIX) 75 MG tablet TAKE 1 TABLET (75 MG TOTAL) BY MOUTH DAILY.  30 tablet  0  . FeFum-FePoly-FA-B Cmp-C-Biot (INTEGRA PLUS) CAPS Take 1 capsule by mouth daily.  90 capsule  1  . hyoscyamine (LEVBID) 0.375 MG 12 hr tablet Take 1 tablet (0.375 mg total) by mouth 2 (two) times daily.  30 tablet  0  . meclizine (ANTIVERT) 25 MG tablet Take 25 mg by mouth 4 (four) times daily as needed.        Marland Kitchen MOVIPREP 100 G SOLR Take 1 kit (200 g total) by mouth once.  1 kit  0  . nitroGLYCERIN (NITROSTAT) 0.4 MG SL tablet Place 1 tablet (0.4 mg total) under the tongue every 5 (five) minutes as needed for chest pain.  25 tablet  4  . omeprazole (PRILOSEC) 20 MG capsule TAKE ONE CAPSULE BY MOUTH EVERY DAY  90 capsule  0  . simvastatin (ZOCOR) 40 MG tablet TAKE 1 TABLET (40 MG TOTAL) BY MOUTH EVERY EVENING.  30 tablet  0  . topiramate (TOPAMAX) 100 MG tablet 100 mg daily.       . vitamin B-12 (CYANOCOBALAMIN) 1000 MCG tablet Take 1,000 mcg by mouth daily.      . vitamin C (ASCORBIC ACID) 500 MG tablet Take 500 mg by mouth daily.  No facility-administered medications prior to visit.   No Known Allergies Patient Active Problem List   Diagnosis Date Noted  . Anemia 12/07/2013  . Headache 12/09/2012  . Abnormal MRI 08/10/2011  . Vertigo 05/29/2011  . Anxiety   . Colon cancer   . Menopause   . CAD, NATIVE VESSEL 06/13/2009  . CONGENITAL FACTOR VIII DISORDER 05/30/2009  . CHEST PAIN 05/30/2009  . HYPERCHOLESTEROLEMIA 12/05/2008  . HYPERTENSION 12/05/2008  . ANGINA, ATYPICAL 12/05/2008  . GERD 12/05/2008   History  Substance Use Topics  . Smoking status: Never Smoker   . Smokeless tobacco: Never Used  . Alcohol Use: 0.5 oz/week    1 drink(s) per week   family history includes Cancer in her father; Coronary artery disease in an other family member; Heart  attack in her father and mother; Heart disease in her father; Hypertension in her mother; Transient ischemic attack in her mother. There is no history of Colon cancer, Pancreatic cancer, Rectal cancer, or Stomach cancer.     Objective:   Physical Exam   Well-developed white female in no acute distress, pleasant blood pressure 130/70 pulse 60 height 5 foot 1 weight 141. HEENT; nontraumatic normocephalic EOMI PERRLA sclera anicteric, Supple; no JVD, Cardiovascular; regular rate and rhythm with S1-S2 no murmur or gallop, Pulmonary; clear bilaterally, Abdomen; soft nontender nondistended bowel sounds are active no palpable mass or hepatosplenomegaly, Rectal; exam not done, Extremities ;no clubbing cyanosis or edema skin warm dry, Psych; mood and affect appropriate       Assessment & Plan:   #1  63 yo female with hx of stage II no carcinoma of the right colon status post right hemicolectomy 2010. Patient had followup colonoscopy 2012 , in  [place] -unremarkable and is due for followup colonoscopy . #2 chronic antiplatelet therapy with Plavix #3 coronary artery disease status post stent to the RCA and LAD- 2001 in 2005. #5 hypertension #6 GERD #7 migraine headaches  Plan; patient is scheduled for colonoscopy with Dr. Scarlette Shorts on May 14. Procedure was discussed in detail and she is agreeable to proceed She states she has always had trouble with the prep and has had nausea and vomiting. Will prescribe Zofran 4 mg at start of the prep We'll obtain consent from Dr. Johnsie Cancel, her cardiologist for patient hold Plavix for 5 days prior to her procedure.

## 2014-01-17 NOTE — Progress Notes (Signed)
Agree with initial assessment and plans as outlined 

## 2014-01-17 NOTE — Patient Instructions (Signed)
We sent prescription for zofran 4 mg, 2 tablets to [facility], [place], [place].   Take 1 tab prior to drinking the colonoscopy prep, and you can take the 2nd tablet prior to the second dose of the prep if needed.  We will call you once we hear from Dr. Johnsie Cancel regarding the Plavix.

## 2014-01-18 NOTE — Telephone Encounter (Signed)
Ok to stop plavix 5 days before colon

## 2014-01-19 ENCOUNTER — Encounter: Payer: Self-pay | Admitting: Internal Medicine

## 2014-01-19 NOTE — Telephone Encounter (Signed)
I called the patient on her cell number and was able to give her the Plavix instructions. I told her to stop the Plavix on 5-9 and stay off of it thru 5-14. She can resume it on 01-27-2014.  Patient verbalized understanding the Plavix instructions.

## 2014-01-19 NOTE — Telephone Encounter (Signed)
MESSAGE FORWARDED TO  PAM PETERMAN

## 2014-01-20 ENCOUNTER — Encounter: Payer: Self-pay | Admitting: Cardiovascular Disease

## 2014-01-20 ENCOUNTER — Ambulatory Visit (INDEPENDENT_AMBULATORY_CARE_PROVIDER_SITE_OTHER): Payer: 59 | Admitting: Cardiovascular Disease

## 2014-01-20 ENCOUNTER — Telehealth: Payer: Self-pay | Admitting: *Deleted

## 2014-01-20 VITALS — BP 146/69 | HR 67 | Ht 61.0 in | Wt 140.8 lb

## 2014-01-20 DIAGNOSIS — I2510 Atherosclerotic heart disease of native coronary artery without angina pectoris: Secondary | ICD-10-CM

## 2014-01-20 DIAGNOSIS — I10 Essential (primary) hypertension: Secondary | ICD-10-CM

## 2014-01-20 DIAGNOSIS — C189 Malignant neoplasm of colon, unspecified: Secondary | ICD-10-CM

## 2014-01-20 NOTE — Telephone Encounter (Signed)
LEFT MESSAGE TO  SEE IF  PT  COULD  COME  AT   3:15  OR  4:15  TODAY ./CY

## 2014-01-20 NOTE — Assessment & Plan Note (Signed)
Well controlled.  Continue current medications and low sodium Dash type diet.    

## 2014-01-20 NOTE — Assessment & Plan Note (Signed)
Stable with no angina and good activity level.  Continue medical Rx Stop plavix and just take ASA 325 mg

## 2014-01-20 NOTE — Patient Instructions (Signed)
Your physician wants you to follow-up in:   [place] will receive a reminder letter in the mail two months in advance. If you don't receive a letter, please call our office to schedule the follow-up appointment. Your physician has recommended you make the following change in your medication: STOP  PLAVIX

## 2014-01-20 NOTE — Assessment & Plan Note (Signed)
Having f/u colon with Dr Henrene Pastor and if clear she will be 5 years out

## 2014-01-20 NOTE — Progress Notes (Signed)
Patient ID: Brenda Lara, female   DOB: 1951-03-18, 63 y.o.   MRN: [id number] This is a 63 year old white female patient with history of coronary artery disease, status post stenting of the RCA in 2006 and LAD in 2005. Repeat cardiac catheterization, May 31, 2009.  showed the patient's stents were widely patent and she had no other significant disease. Her ejection fraction was 55% with mild apical hypokinesis. No chest pain primary following cholesterol LDL under 100 in February  Non angina compliant with meds       ROS: Denies fever, malais, weight loss, blurry vision, decreased visual acuity, cough, sputum, SOB, hemoptysis, pleuritic pain, palpitaitons, heartburn, abdominal pain, melena, lower extremity edema, claudication, or rash.  All other systems reviewed and negative  General: Affect appropriate Healthy:  appears stated age 63: normal Neck supple with no adenopathy JVP normal no bruits no thyromegaly Lungs clear with no wheezing and good diaphragmatic motion Heart:  S1/S2 no murmur, no rub, gallop or click PMI normal Abdomen: benighn, BS positve, no tenderness, no AAA no bruit.  No HSM or HJR Distal pulses intact with no bruits No edema Neuro non-focal Skin warm and dry No muscular weakness   Current Outpatient Prescriptions  Medication Sig Dispense Refill  . aspirin 325 MG tablet Take 325 mg by mouth daily.        Marland Kitchen atenolol (TENORMIN) 25 MG tablet TAKE 1 TABLET (25 MG TOTAL) BY MOUTH 2 (TWO) TIMES DAILY.  180 tablet  1  . baclofen (LIORESAL) 10 MG tablet 10 mg as needed.       . clopidogrel (PLAVIX) 75 MG tablet TAKE 1 TABLET (75 MG TOTAL) BY MOUTH DAILY.  30 tablet  0  . FeFum-FePoly-FA-B Cmp-C-Biot (INTEGRA PLUS) CAPS Take 1 capsule by mouth daily.  90 capsule  1  . hyoscyamine (LEVBID) 0.375 MG 12 hr tablet Take 1 tablet (0.375 mg total) by mouth 2 (two) times daily.  30 tablet  0  . meclizine (ANTIVERT) 25 MG tablet Take 25 mg by mouth 4 (four) times daily  as needed.        Marland Kitchen MOVIPREP 100 G SOLR Take 1 kit (200 g total) by mouth once.  1 kit  0  . nitroGLYCERIN (NITROSTAT) 0.4 MG SL tablet Place 1 tablet (0.4 mg total) under the tongue every 5 (five) minutes as needed for chest pain.  25 tablet  4  . omeprazole (PRILOSEC) 20 MG capsule TAKE ONE CAPSULE BY MOUTH EVERY DAY  90 capsule  0  . ondansetron (ZOFRAN) 4 MG tablet Take 1 tab before you start drinking the colonoscopy prep then the second tab as needed.  2 tablet  0  . simvastatin (ZOCOR) 40 MG tablet TAKE 1 TABLET (40 MG TOTAL) BY MOUTH EVERY EVENING.  30 tablet  0  . topiramate (TOPAMAX) 100 MG tablet 100 mg daily.       . vitamin B-12 (CYANOCOBALAMIN) 1000 MCG tablet Take 1,000 mcg by mouth daily.      . vitamin C (ASCORBIC ACID) 500 MG tablet Take 500 mg by mouth daily.       No current facility-administered medications for this visit.    Allergies  Review of patient's allergies indicates no known allergies.  Electrocardiogram:  NSR rate 67 normal ECG   Assessment and Plan

## 2014-01-23 NOTE — Telephone Encounter (Signed)
WAS   SEEN  AT   ORIGINAL SCHEDULED TIME .Adonis Housekeeper

## 2014-01-25 ENCOUNTER — Other Ambulatory Visit: Payer: Self-pay | Admitting: Internal Medicine

## 2014-01-26 ENCOUNTER — Ambulatory Visit (AMBULATORY_SURGERY_CENTER): Payer: 59 | Admitting: Internal Medicine

## 2014-01-26 ENCOUNTER — Encounter: Payer: Self-pay | Admitting: Internal Medicine

## 2014-01-26 VITALS — BP 126/72 | HR 60 | Temp 97.3°F | Resp 27 | Ht 61.0 in | Wt 141.0 lb

## 2014-01-26 DIAGNOSIS — Z85038 Personal history of other malignant neoplasm of large intestine: Secondary | ICD-10-CM

## 2014-01-26 HISTORY — PX: COLONOSCOPY: SHX174

## 2014-01-26 MED ORDER — SODIUM CHLORIDE 0.9 % IV SOLN: 500.0000 mL | INTRAVENOUS | Status: DC

## 2014-01-26 NOTE — Progress Notes (Signed)
Report to pacu rn, vss, bbs=clear 

## 2014-01-26 NOTE — Op Note (Signed)
[facility]  [address]. [place], [address]   COLONOSCOPY PROCEDURE REPORT  PATIENT: Brenda Lara, Brenda Lara  MR#: [id number] BIRTHDATE: 12-05-1950 , 87  yrs. old GENDER: Female ENDOSCOPIST: Eustace Quail, MD REFERRED WC:HENI Edrick Kins, M.D. PROCEDURE DATE:  01/26/2014 PROCEDURE:   Colonoscopy, surveillance First Screening Colonoscopy - Avg.  risk and is 50 yrs.  old or older - No.  Prior Negative Screening - Now for repeat screening. N/A  History of Adenoma - Now for follow-up colonoscopy & has been > or = to 3 yrs.  N/A  Polyps Removed Today? No.  Recommend repeat exam, <10 yrs? Yes.  High risk (family or personal hx). ASA CLASS:   Class III INDICATIONS:High risk patient with personal history of colon cancer. Stage II colon cancer status post right hemicolectomy 2010. Negative surveillance colonoscopy 2012. ([place]). Now for followup. MEDICATIONS: MAC sedation, administered by CRNA and propofol (Diprivan) 200mg  IV  DESCRIPTION OF PROCEDURE:   After the risks benefits and alternatives of the procedure were thoroughly explained, informed consent was obtained.  A digital rectal exam revealed no abnormalities of the rectum.   The LB [id number]  endoscope was introduced through the anus and advanced to the surgical anastomosis. No adverse events experienced.   The quality of the prep was excellent, using MoviPrep  The instrument was then slowly withdrawn as the colon was fully examined.      COLON FINDINGS: There was evidence of a prior ileocolonic surgical anastomosis  in the right colon.   The colon was otherwise normal. There was no diverticulosis, inflammation, polyps or cancers unless previously stated.  Retroflexed views revealed internal hemorrhoids. The time to cecum=3 minutes 34 seconds.  Withdrawal time=8 minutes 09 seconds.  The scope was withdrawn and the procedure completed.  COMPLICATIONS: There were no  complications.  ENDOSCOPIC IMPRESSION: 1.   Status post right hemicolectomy 2.   The colon was otherwise normal  RECOMMENDATIONS: 1. Follow up colonoscopy in 5 years   eSigned:  Eustace Quail, MD 01/26/2014 10:15 AM   cc: Kavin Leech, MD, Emi Belfast, MD, and The Patient

## 2014-01-26 NOTE — Telephone Encounter (Signed)
Refill request

## 2014-01-26 NOTE — Patient Instructions (Signed)
YOU HAD AN ENDOSCOPIC PROCEDURE TODAY AT THE [facility]: Refer to the procedure report that was given to you for any specific questions about what was found during the examination.  If the procedure report does not answer your questions, please call your gastroenterologist to clarify.  If you requested that your care partner not be given the details of your procedure findings, then the procedure report has been included in a sealed envelope for you to review at your convenience later.  YOU SHOULD EXPECT: Some feelings of bloating in the abdomen. Passage of more gas than usual.  Walking can help get rid of the air that was put into your GI tract during the procedure and reduce the bloating. If you had a lower endoscopy (such as a colonoscopy or flexible sigmoidoscopy) you may notice spotting of blood in your stool or on the toilet paper. If you underwent a bowel prep for your procedure, then you may not have a normal bowel movement for a few days.  DIET: Your first meal following the procedure should be a light meal and then it is ok to progress to your normal diet.  A half-sandwich or bowl of soup is an example of a good first meal.  Heavy or fried foods are harder to digest and may make you feel nauseous or bloated.  Likewise meals heavy in dairy and vegetables can cause extra gas to form and this can also increase the bloating.  Drink plenty of fluids but you should avoid alcoholic beverages for 24 hours.  ACTIVITY: Your care partner should take you home directly after the procedure.  You should plan to take it easy, moving slowly for the rest of the day.  You can resume normal activity the day after the procedure however you should NOT DRIVE or use heavy machinery for 24 hours (because of the sedation medicines used during the test).    SYMPTOMS TO REPORT IMMEDIATELY: A gastroenterologist can be reached at any hour.  During normal business hours, 8:30 AM to 5:00 PM Monday through Friday,  call [phone].  After hours and on weekends, please call the GI answering service at [phone] who will take a message and have the physician on call contact you.   Following lower endoscopy (colonoscopy or flexible sigmoidoscopy):  Excessive amounts of blood in the stool  Significant tenderness or worsening of abdominal pains  Swelling of the abdomen that is new, acute  Fever of 100F or higher  FOLLOW UP: If any biopsies were taken you will be contacted by phone or by letter within the next 1-3 weeks.  Call your gastroenterologist if you have not heard about the biopsies in 3 weeks.  Our staff will call the home number listed on your records the next business day following your procedure to check on you and address any questions or concerns that you may have at that time regarding the information given to you following your procedure. This is a courtesy call and so if there is no answer at the home number and we have not heard from you through the emergency physician on call, we will assume that you have returned to your regular daily activities without incident.  SIGNATURES/CONFIDENTIALITY: You and/or your care partner have signed paperwork which will be entered into your electronic medical record.  These signatures attest to the fact that that the information above on your After Visit Summary has been reviewed and is understood.  Full responsibility of the confidentiality of this   this discharge information lies with you and/or your care-partner.  Follow up colonoscopy in 5 years 

## 2014-01-27 ENCOUNTER — Telehealth: Payer: Self-pay | Admitting: *Deleted

## 2014-01-27 NOTE — Telephone Encounter (Signed)
No answer, left message to call if questions or concerns. 

## 2014-02-01 ENCOUNTER — Other Ambulatory Visit: Payer: Self-pay | Admitting: *Deleted

## 2014-02-01 MED ORDER — SIMVASTATIN 40 MG PO TABS: ORAL_TABLET | ORAL | Status: DC

## 2014-02-10 ENCOUNTER — Other Ambulatory Visit: Payer: 59

## 2014-02-10 ENCOUNTER — Ambulatory Visit (HOSPITAL_BASED_OUTPATIENT_CLINIC_OR_DEPARTMENT_OTHER): Payer: 59 | Admitting: Nurse Practitioner

## 2014-02-10 VITALS — BP 139/68 | HR 63 | Temp 98.3°F | Resp 18 | Ht 61.0 in | Wt 141.1 lb

## 2014-02-10 DIAGNOSIS — C189 Malignant neoplasm of colon, unspecified: Secondary | ICD-10-CM

## 2014-02-10 DIAGNOSIS — Z8673 Personal history of transient ischemic attack (TIA), and cerebral infarction without residual deficits: Secondary | ICD-10-CM

## 2014-02-10 DIAGNOSIS — D509 Iron deficiency anemia, unspecified: Secondary | ICD-10-CM

## 2014-02-10 DIAGNOSIS — Z85038 Personal history of other malignant neoplasm of large intestine: Secondary | ICD-10-CM

## 2014-02-10 LAB — CEA: CEA: 0.6 ng/mL (ref 0.0–5.0)

## 2014-02-10 NOTE — Progress Notes (Signed)
  [facility] OFFICE PROGRESS NOTE   Diagnosis:  Colon cancer.  INTERVAL HISTORY:   Brenda Lara returns as scheduled. She feels well. She has a good appetite. No weight loss. Bowels moving regularly. No bloody or black stools. She denies nausea/vomiting. No shortness of breath or cough. No urinary symptoms.  In March she developed lower abdominal pain. She had a CT scan of the abdomen/pelvis on 12/05/2013. There were no findings to explain the abdominal pain. She reports the pain slowly resolved and has not recurred.  Objective:  Vital signs in last 24 hours:  Blood pressure 139/68, pulse 63, temperature 98.3 F (36.8 C), temperature source Oral, resp. rate 18, height 5\' 1"  (1.549 m), weight 141 lb 1.6 oz (64.003 kg), SpO2 100.00%.    HEENT: No thrush or ulcerations. Lymphatics: No palpable cervical, supraclavicular, axillary or inguinal lymph nodes. Resp: Lungs clear. Cardio: Regular cardiac rhythm. GI: Abdomen soft and nontender. No mass. No organomegaly. Vascular: No leg edema. Calves soft and nontender.   Lab Results:  Lab Results  Component Value Date   WBC 7.7 12/05/2013   HGB 10.6* 12/05/2013   HCT 33.0* 12/05/2013   MCV 76.4* 12/05/2013   PLT 282 12/05/2013   NEUTROABS 5.7 12/05/2013   CEA pending.  Imaging:  No results found.  Medications: I have reviewed the patient's current medications.  Assessment/Plan: 1. Stage II colon cancer (T3 N0) of the cecum/appendix, status post a right hemicolectomy and bilateral oophorectomy 04/02/2009. She underwent an initial appendectomy/cecum resection on 02/21/2009 confirming an adenocarcinoma of the cecum.  2. History of coronary artery disease. 3. Colonoscopy 01/26/2014 by Dr. Henrene Pastor. Status post right hemicolectomy, colon otherwise normal. Next colonoscopy recommended in 5 years. 4. Microcytic anemia.     Disposition: Brenda Lara remains in clinical remission from colon cancer. She is now approximately 5  years out from diagnosis. We will followup on the CEA from today and contact her with the result.   Initially we were not going to schedule formal followup in our office as she is 5 years out from diagnosis. After leaving the office it was noted she had a new microcytic anemia on labs done 12/05/2013 with indices consistent with iron deficiency. I contacted her to discuss this finding. Dr. Benay Spice recommends a referral to Dr. Henrene Pastor for consideration of an upper endoscopy. She is in agreement with this plan.  We will schedule a followup visit in 4 months.    Plan reviewed with Dr. Benay Spice.    Owens Shark ANP/GNP-BC   02/10/2014  11:10 AM

## 2014-02-13 ENCOUNTER — Telehealth: Payer: Self-pay | Admitting: Nurse Practitioner

## 2014-02-13 NOTE — Telephone Encounter (Signed)
, °

## 2014-02-14 ENCOUNTER — Telehealth: Payer: Self-pay | Admitting: *Deleted

## 2014-02-14 ENCOUNTER — Encounter: Payer: Self-pay | Admitting: *Deleted

## 2014-02-14 DIAGNOSIS — C189 Malignant neoplasm of colon, unspecified: Secondary | ICD-10-CM

## 2014-02-14 NOTE — Telephone Encounter (Signed)
Message copied by Brien Few on Tue Feb 14, 2014 10:24 AM ------      Message from: Betsy Coder B      Created: Mon Feb 13, 2014  5:56 PM       Please call patient, cea is normal, repeat with labs in 4 months ------

## 2014-02-14 NOTE — Telephone Encounter (Signed)
Left message on voicemail informing pt of normal labs.  

## 2014-02-17 ENCOUNTER — Encounter: Payer: Self-pay | Admitting: *Deleted

## 2014-02-17 ENCOUNTER — Ambulatory Visit: Payer: 59 | Admitting: Gastroenterology

## 2014-02-23 ENCOUNTER — Ambulatory Visit (INDEPENDENT_AMBULATORY_CARE_PROVIDER_SITE_OTHER): Payer: 59 | Admitting: Gastroenterology

## 2014-02-23 ENCOUNTER — Other Ambulatory Visit (INDEPENDENT_AMBULATORY_CARE_PROVIDER_SITE_OTHER): Payer: 59

## 2014-02-23 ENCOUNTER — Encounter: Payer: Self-pay | Admitting: Gastroenterology

## 2014-02-23 VITALS — BP 130/82 | HR 73 | Ht 61.0 in | Wt 142.0 lb

## 2014-02-23 DIAGNOSIS — D649 Anemia, unspecified: Secondary | ICD-10-CM

## 2014-02-23 LAB — CBC
HEMATOCRIT: 37.9 % (ref 36.0–46.0)
HEMOGLOBIN: 12.6 g/dL (ref 12.0–15.0)
MCHC: 33.3 g/dL (ref 30.0–36.0)
MCV: 81.9 fl (ref 78.0–100.0)
Platelets: 284 10*3/uL (ref 150.0–400.0)
RBC: 4.63 Mil/uL (ref 3.87–5.11)
RDW: 19 % — AB (ref 11.5–15.5)
WBC: 7.2 10*3/uL (ref 4.0–10.5)

## 2014-02-23 NOTE — Progress Notes (Signed)
Jess, EGD to r/o celiac or AVM's (if negative, possible capsule study) might be reasonable if not done previously.

## 2014-02-23 NOTE — Patient Instructions (Addendum)
Your physician has requested that you go to the basement for the following lab work before leaving today: CBC  Hemoccult Cards given today, please follow instructions on card and mail back

## 2014-02-23 NOTE — Progress Notes (Signed)
     02/23/2014 Brenda Lara [id number] Jan 24, 1951   History of Present Illness:  This is a pleasant 63 year old female who is known to Dr. Henrene Pastor. She has a history of stage II adenocarcinoma of the colon diagnosed in 2010. She is status post right hemicolectomy. She did not require any adjuvant therapy.  Her surgery was performed in [place].  Her last colonoscopy was performed Dr. Henrene Pastor just in May 2015 at which time she had changes consistent with right-sided hemicolectomy. Otherwise the procedure was normal and it was recommend that she have a repeat colonoscopy in 5 years from that time. She was sent to our office today at the request of Dr. Benay Spice for recent anemia. The patient is somewhat frustrated because she says that she has had anemia on and off for many years and has been on iron supplementation on and off for many years. She says that this is not new for her. Her most recent hemoglobin was actually in March of this year and was 10.6 g at that time. One year prior it had been 12.4 g. Looking back she has had hemoglobins in the 10 g range in the past. She had not been on any iron supplementation prior to the most recent lab studies. She says that after finding out about that lower hemoglobin she restarted iron supplement in the form of Integra daily and has been taking it faithfully since that time. She denies any dark or bloody stools. She was actually heme-negative on one occasion at her PCPs office in March.  She denies any GI complaints.   Current Medications, Allergies, Past Medical History, Past Surgical History, Family History and Social History were reviewed in [employer] record.   Physical Exam: BP 130/82  Pulse 73  Ht 5\' 1"  (1.549 m)  Wt 142 lb (64.411 kg)  BMI 26.84 kg/m2 General: Well developed white female in no acute distress Head: Normocephalic and atraumatic Eyes:  Sclerae anicteric, conjunctiva pink  Ears: Normal auditory acuity Lungs:  Clear throughout to auscultation Heart: Regular rate and rhythm Abdomen: Soft, non-distended.  Normal bowel sounds.  Non-tender.   Rectal:  Was heme negative at PCP. Musculoskeletal: Symmetrical with no gross deformities  Extremities: No edema  Neurological: Alert oriented x 4, grossly non-focal Psychological:  Alert and cooperative. Normal mood and affect  Assessment and Recommendations: -Anemia, recurrent for several years:  Reports issues with anemia on and off for several years. Was off of iron supplements until most recent labs showed a dip in Hgb.  Restarted Integra in March.  Was heme negative at PCP's office.  Colonoscopy is up to date.  We will recheck  (CBC) today and have her perform hemoccult cards x 3.  If Hgb is improved and stool cards are negative then likely no other evaluation will be needed at this point.  Continue iron supplementation.

## 2014-02-23 NOTE — Progress Notes (Signed)
I saw this patient earlier today.  Dr. Henrene Pastor reviewed my visit and even though her Hgb is much improved he would still like her to have an EGD.  Please contact her to make her aware of this and schedule it.  I would still like her to complete the hemoccult cards that she was given so that if the EGD is negative as well as the hemoccult cards then we likely will not have to pursue this any further.  Thank you,  Jess

## 2014-02-26 ENCOUNTER — Other Ambulatory Visit: Payer: Self-pay | Admitting: Internal Medicine

## 2014-02-27 NOTE — Telephone Encounter (Signed)
Please see Refill Request below  Requested Medications     Medication name:  Name from pharmacy:  atenolol (TENORMIN) 25 MG tablet  ATENOLOL 25 MG TABLET    The source prescription has been discontinued.    Sig: TAKE 1 TABLET (25 MG TOTAL) BY MOUTH 2 (TWO) TIMES DAILY.    Dispense: 180 tablet Refills: 0 Start: 02/26/2014  Class: Normal    Requested on: 05/24/2013    Originally ordered on: 11/27/2010 Last refill: 01/29/2014 Order History and Details           Medication name:  Name from pharmacy:  omeprazole (PRILOSEC) 20 MG capsule  OMEPRAZOLE DR 20 MG CAPSULE    The source prescription has been discontinued.    Sig: TAKE ONE CAPSULE BY MOUTH EVERY DAY    Dispense: 90 capsule Start: 02/26/2014  Class: Normal    Requested on: 09/26/2013    Originally ordered on: 11/27/2010 Last refill: 01/29/2014 Order History and Details

## 2014-03-01 ENCOUNTER — Telehealth: Payer: Self-pay

## 2014-03-01 NOTE — Telephone Encounter (Signed)
Left message for pt to call back  °

## 2014-03-01 NOTE — Telephone Encounter (Signed)
Message copied by Algernon Huxley on Wed Mar 01, 2014  3:55 PM ------      Message from: Alonza Bogus D      Created: Thu Feb 23, 2014  4:11 PM                   ----- Message -----         From: Irene Shipper, MD         Sent: 02/23/2014   4:03 PM           To: Laban Emperor. Zehr, PA-C            Go ahead and set her up for EGD. Thanks      ----- Message -----         From: Laban Emperor. Zehr, PA-C         Sent: 02/23/2014   3:57 PM           To: Irene Shipper, MD            I received her labs back from today and her Hgb is up to 12.6 grams.  Do you want to wait for the hemoccult cards or do you want me to go ahead and schedule her for EGD?\            Thank you,            Jess             ----- Message -----         From: Irene Shipper, MD         Sent: 02/23/2014   1:23 PM           To: Laban Emperor. Zehr, PA-C                        ----- Message -----         From: Laban Emperor. Zehr, PA-C         Sent: 02/23/2014   1:00 PM           To: Irene Shipper, MD                         ------

## 2014-03-02 NOTE — Telephone Encounter (Signed)
Spoke with pt and she was under the impression that her labs were to be monitored to see if her Hgb stayed up. Pt would like to know if scheduling the EGD can wait or does Dr. Henrene Pastor think she really needs to have one done now. Dr. Henrene Pastor please advise.

## 2014-03-03 NOTE — Telephone Encounter (Signed)
Yes, I recommend EGD. The sooner, the better. We are trying to understand why she has iron deficiency anemia in the first place. Thanks

## 2014-03-03 NOTE — Telephone Encounter (Signed)
Left message for pt to call back  °

## 2014-03-06 ENCOUNTER — Other Ambulatory Visit: Payer: Self-pay

## 2014-03-06 DIAGNOSIS — D509 Iron deficiency anemia, unspecified: Secondary | ICD-10-CM

## 2014-03-06 NOTE — Telephone Encounter (Signed)
Left message for pt to call back  °

## 2014-03-06 NOTE — Telephone Encounter (Signed)
Pt aware, states she will call back this afternoon to possibly schedule EGD for 03/10/14.

## 2014-03-06 NOTE — Telephone Encounter (Signed)
Pt called back and wants to schedule EGD for 03/08/14@4pm , previsit scheduled for 03/07/14@4 :30pm. Pt aware of appts.

## 2014-03-07 ENCOUNTER — Ambulatory Visit (AMBULATORY_SURGERY_CENTER): Payer: Self-pay

## 2014-03-07 VITALS — Ht 61.0 in | Wt 141.0 lb

## 2014-03-07 DIAGNOSIS — D649 Anemia, unspecified: Secondary | ICD-10-CM

## 2014-03-07 NOTE — Progress Notes (Signed)
Per pt, no allergies to soy or egg products.Pt not taking any weight loss meds or using  O2 at home. 

## 2014-03-08 ENCOUNTER — Ambulatory Visit (AMBULATORY_SURGERY_CENTER): Payer: 59 | Admitting: Internal Medicine

## 2014-03-08 ENCOUNTER — Encounter: Payer: Self-pay | Admitting: Internal Medicine

## 2014-03-08 VITALS — BP 119/73 | HR 68 | Temp 98.0°F | Resp 23 | Ht 61.0 in | Wt 141.0 lb

## 2014-03-08 DIAGNOSIS — D1330 Benign neoplasm of unspecified part of small intestine: Secondary | ICD-10-CM

## 2014-03-08 DIAGNOSIS — D509 Iron deficiency anemia, unspecified: Secondary | ICD-10-CM

## 2014-03-08 DIAGNOSIS — D649 Anemia, unspecified: Secondary | ICD-10-CM

## 2014-03-08 MED ORDER — SODIUM CHLORIDE 0.9 % IV SOLN: 500.0000 mL | INTRAVENOUS | Status: DC

## 2014-03-08 NOTE — Op Note (Signed)
[facility]  [address]. [place], [address]   ENDOSCOPY PROCEDURE REPORT  PATIENT: Brenda, Lara  MR#: [id number] BIRTHDATE: [birth year] , 41  yrs. old GENDER: Female ENDOSCOPIST: Eustace Quail, MD REFERRED BY:  Arturo Morton, M.D. PROCEDURE DATE:  03/08/2014 PROCEDURE:  EGD w/ biopsy ASA CLASS:     Class II INDICATIONS:  Iron deficiency anemia. MEDICATIONS: MAC sedation, administered by CRNA and propofol (Diprivan) 240mg  IV TOPICAL ANESTHETIC: none  DESCRIPTION OF PROCEDURE: After the risks benefits and alternatives of the procedure were thoroughly explained, informed consent was obtained.  The LB [id number] endoscope was introduced through the mouth and advanced to the third portion of the duodenum. Without limitations.  The instrument was slowly withdrawn as the mucosa was fully examined.      EXAM:The esophagus was normal.  The stomach mucosa was atrophic but otherwise normal.  The duodenal bulb and post bulbar duodenum to the third portion was unremarkable.  Multiple duodenal biopsies taken.  Retroflexed views revealed no abnormalities.     The scope was then withdrawn from the patient and the procedure completed.  COMPLICATIONS: There were no complications. ENDOSCOPIC IMPRESSION: 1. Atrophic gastric mucosa. Otherwise normal EGD to the third duodenum status post duodenal biopsies 2. Iron deficiency anemia  RECOMMENDATIONS: 1.  Await biopsy results 2.  Continue iron therapy daily 3.  If biopsies negative, would consider capsule endoscopy to evaluate unexamined small bowel  REPEAT EXAM:  eSigned:  Eustace Quail, MD 03/08/2014 4:23 PM   AN:VBTYOMA Benay Spice, MD ; the Patient

## 2014-03-08 NOTE — Patient Instructions (Signed)
YOU HAD AN ENDOSCOPIC PROCEDURE TODAY AT [facility]: Refer to the procedure report that was given to you for any specific questions about what was found during the examination.  If the procedure report does not answer your questions, please call your gastroenterologist to clarify.  If you requested that your care partner not be given the details of your procedure findings, then the procedure report has been included in a sealed envelope for you to review at your convenience later.  YOU SHOULD EXPECT: Some feelings of bloating in the abdomen. Passage of more gas than usual.  Walking can help get rid of the air that was put into your GI tract during the procedure and reduce the bloating.   DIET: Your first meal following the procedure should be a light meal and then it is ok to progress to your normal diet.  A half-sandwich or bowl of soup is an example of a good first meal.  Heavy or fried foods are harder to digest and may make you feel nauseous or bloated.  Likewise meals heavy in dairy and vegetables can cause extra gas to form and this can also increase the bloating.  Drink plenty of fluids but you should avoid alcoholic beverages for 24 hours.  ACTIVITY: Your care partner should take you home directly after the procedure.  You should plan to take it easy, moving slowly for the rest of the day.  You can resume normal activity the day after the procedure however you should NOT DRIVE or use heavy machinery for 24 hours (because of the sedation medicines used during the test).    SYMPTOMS TO REPORT IMMEDIATELY: A gastroenterologist can be reached at any hour.  During normal business hours, 8:30 AM to 5:00 PM Monday through Friday, call [phone].  After hours and on weekends, please call the GI answering service at [phone] who will take a message and have the physician on call contact you.   Following upper endoscopy (EGD)  Vomiting of blood or coffee ground material  New  chest pain or pain under the shoulder blades  Painful or persistently difficult swallowing  New shortness of breath  Fever of 100F or higher  Black, tarry-looking stools  FOLLOW UP: If any biopsies were taken you will be contacted by phone or by letter within the next 1-3 weeks.  Call your gastroenterologist if you have not heard about the biopsies in 3 weeks.  Our staff will call the home number listed on your records the next business day following your procedure to check on you and address any questions or concerns that you may have at that time regarding the information given to you following your procedure. This is a courtesy call and so if there is no answer at the home number and we have not heard from you through the emergency physician on call, we will assume that you have returned to your regular daily activities without incident.  SIGNATURES/CONFIDENTIALITY: You and/or your care partner have signed paperwork which will be entered into your electronic medical record.  These signatures attest to the fact that that the information above on your After Visit Summary has been reviewed and is understood.  Full responsibility of the confidentiality of this discharge information lies with you and/or your care-partner.   Await pathology; may do further studies depending on what pathology shows  Continue your normal medications- including Iron

## 2014-03-08 NOTE — Progress Notes (Signed)
Called to room to assist during endoscopic procedure.  Patient ID and intended procedure confirmed with present staff. Received instructions for my participation in the procedure from the performing physician.  

## 2014-03-08 NOTE — Progress Notes (Signed)
A/ox3, pleased with MAC, report to RN 

## 2014-03-09 ENCOUNTER — Telehealth: Payer: Self-pay

## 2014-03-09 ENCOUNTER — Other Ambulatory Visit (INDEPENDENT_AMBULATORY_CARE_PROVIDER_SITE_OTHER): Payer: 59

## 2014-03-09 DIAGNOSIS — D649 Anemia, unspecified: Secondary | ICD-10-CM

## 2014-03-09 LAB — HEMOCCULT SLIDES (X 3 CARDS)
FECAL OCCULT BLD: NEGATIVE
OCCULT 1: NEGATIVE
OCCULT 2: NEGATIVE
OCCULT 3: NEGATIVE
OCCULT 4: NEGATIVE
OCCULT 5: NEGATIVE

## 2014-03-09 NOTE — Telephone Encounter (Signed)
No answer left message to call office if questions or concerns. 

## 2014-03-13 ENCOUNTER — Encounter: Payer: Self-pay | Admitting: Internal Medicine

## 2014-03-22 ENCOUNTER — Other Ambulatory Visit: Payer: Self-pay | Admitting: Internal Medicine

## 2014-03-27 ENCOUNTER — Other Ambulatory Visit: Payer: Self-pay | Admitting: Internal Medicine

## 2014-03-28 NOTE — Telephone Encounter (Signed)
Requested Medications     Medication name:  Name from pharmacy:  atenolol (TENORMIN) 25 MG tablet  ATENOLOL 25 MG TABLET    The source prescription has been discontinued.    Sig: TAKE 1 TABLET (25 MG TOTAL) BY MOUTH 2 (TWO) TIMES DAILY.    Dispense: 180 tablet Refills: 0 Start: 03/27/2014  Class: Normal    Requested on: 09/26/2013    Originally ordered on: 11/27/2010 Last refill: 02/27/2014 Order History and Details           Medication name:  Name from pharmacy:  omeprazole (PRILOSEC) 20 MG capsule  OMEPRAZOLE DR 20 MG CAPSULE    The source prescription has been discontinued.    Sig: TAKE ONE CAPSULE BY MOUTH EVERY DAY    Dispense: 90 capsule Refills: 0 Start: 03/27/2014  Class: Normal    Requested on: 11/23/2013    Originally ordered on: 11/27/2010 Last refill: 02/27/2014 Order History and Details

## 2014-04-03 ENCOUNTER — Telehealth: Payer: Self-pay

## 2014-04-03 NOTE — Telephone Encounter (Signed)
Called pt to scheduled capsule endo. Left message for pt to call back.

## 2014-04-04 NOTE — Telephone Encounter (Signed)
Left message for pt to call back  °

## 2014-04-05 NOTE — Telephone Encounter (Signed)
Noted  

## 2014-04-05 NOTE — Telephone Encounter (Signed)
Left message for pt to call back. After multiple attempts have been unable to reach pt. Letter mailed to pt requesting she call to schedule capsule endoscopy. Dr. Henrene Pastor notified.

## 2014-04-26 ENCOUNTER — Other Ambulatory Visit: Payer: Self-pay | Admitting: Internal Medicine

## 2014-04-27 NOTE — Telephone Encounter (Signed)
Requested Medications     Medication name:  Name from pharmacy:  atenolol (TENORMIN) 25 MG tablet  ATENOLOL 25 MG TABLET    Sig: TAKE 1 TABLET (25 MG TOTAL) BY MOUTH 2 (TWO) TIMES DAILY.    Dispense: 180 tablet Start: 04/26/2014  Class: Normal    Requested on: 03/29/2014    Originally ordered on: 11/27/2010 Last refill: 03/29/2014 Order History and Details

## 2014-05-24 ENCOUNTER — Other Ambulatory Visit: Payer: Self-pay | Admitting: Internal Medicine

## 2014-05-25 NOTE — Telephone Encounter (Signed)
Refill request

## 2014-06-10 ENCOUNTER — Other Ambulatory Visit: Payer: Self-pay | Admitting: Internal Medicine

## 2014-06-14 ENCOUNTER — Telehealth: Payer: Self-pay | Admitting: Nurse Practitioner

## 2014-06-14 NOTE — Telephone Encounter (Signed)
, °

## 2014-06-15 ENCOUNTER — Other Ambulatory Visit: Payer: 59

## 2014-06-15 ENCOUNTER — Telehealth: Payer: Self-pay | Admitting: Nurse Practitioner

## 2014-06-15 ENCOUNTER — Ambulatory Visit: Payer: 59 | Admitting: Nurse Practitioner

## 2014-06-15 NOTE — Telephone Encounter (Signed)
, °

## 2014-06-23 ENCOUNTER — Other Ambulatory Visit: Payer: Self-pay | Admitting: Internal Medicine

## 2014-06-26 ENCOUNTER — Ambulatory Visit (HOSPITAL_BASED_OUTPATIENT_CLINIC_OR_DEPARTMENT_OTHER): Payer: 59 | Admitting: Nurse Practitioner

## 2014-06-26 ENCOUNTER — Other Ambulatory Visit (HOSPITAL_BASED_OUTPATIENT_CLINIC_OR_DEPARTMENT_OTHER): Payer: 59

## 2014-06-26 ENCOUNTER — Telehealth: Payer: Self-pay | Admitting: Oncology

## 2014-06-26 VITALS — BP 131/62 | HR 60 | Temp 97.9°F | Resp 18 | Ht 61.0 in | Wt 142.2 lb

## 2014-06-26 DIAGNOSIS — D649 Anemia, unspecified: Secondary | ICD-10-CM

## 2014-06-26 DIAGNOSIS — C189 Malignant neoplasm of colon, unspecified: Secondary | ICD-10-CM

## 2014-06-26 DIAGNOSIS — D509 Iron deficiency anemia, unspecified: Secondary | ICD-10-CM

## 2014-06-26 DIAGNOSIS — Z85038 Personal history of other malignant neoplasm of large intestine: Secondary | ICD-10-CM

## 2014-06-26 LAB — CBC WITH DIFFERENTIAL/PLATELET
BASO%: 0.4 % (ref 0.0–2.0)
BASOS ABS: 0 10*3/uL (ref 0.0–0.1)
EOS ABS: 0 10*3/uL (ref 0.0–0.5)
EOS%: 0.5 % (ref 0.0–7.0)
HEMATOCRIT: 39.8 % (ref 34.8–46.6)
HEMOGLOBIN: 12.9 g/dL (ref 11.6–15.9)
LYMPH#: 1.7 10*3/uL (ref 0.9–3.3)
LYMPH%: 25.2 % (ref 14.0–49.7)
MCH: 28.7 pg (ref 25.1–34.0)
MCHC: 32.4 g/dL (ref 31.5–36.0)
MCV: 88.4 fL (ref 79.5–101.0)
MONO#: 0.6 10*3/uL (ref 0.1–0.9)
MONO%: 8.4 % (ref 0.0–14.0)
NEUT#: 4.5 10*3/uL (ref 1.5–6.5)
NEUT%: 65.5 % (ref 38.4–76.8)
Platelets: 248 10*3/uL (ref 145–400)
RBC: 4.5 10*6/uL (ref 3.70–5.45)
RDW: 12.9 % (ref 11.2–14.5)
WBC: 6.9 10*3/uL (ref 3.9–10.3)

## 2014-06-26 LAB — CHCC SMEAR

## 2014-06-26 LAB — FERRITIN CHCC: Ferritin: 29 ng/ml (ref 9–269)

## 2014-06-26 NOTE — Telephone Encounter (Signed)
[facility] APPT SCHEDULE FOR FEB 2016

## 2014-06-26 NOTE — Progress Notes (Signed)
  [facility] OFFICE PROGRESS NOTE   Diagnosis:  Colon cancer  INTERVAL HISTORY:   Brenda Lara returns as scheduled. She feels well. She continues oral iron. She notes loose stools since she resumed oral iron several months ago. She denies any bleeding. Specifically no bloody or black stools. She has noted no change in her energy level since the hemoglobin corrected into normal range.  Objective:  Vital signs in last 24 hours:  Blood pressure 131/62, pulse 60, temperature 97.9 F (36.6 C), temperature source Oral, resp. rate 18, height 5\' 1"  (1.549 m), weight 142 lb 3.2 oz (64.501 kg), SpO2 100.00%.    HEENT: No thrush or ulcers. Lymphatics: No palpable cervical, supraclavicular or axillary lymph nodes. Resp: Lungs clear bilaterally. Cardio: Regular rate and rhythm. GI: Abdomen soft and nontender. No mass. No organomegaly. Vascular: No leg edema.  Lab Results:  Lab Results  Component Value Date   WBC 6.9 06/26/2014   HGB 12.9 06/26/2014   HCT 39.8 06/26/2014   MCV 88.4 06/26/2014   PLT 248 06/26/2014   NEUTROABS 4.5 06/26/2014    Imaging:  No results found.  Medications: I have reviewed the patient's current medications.  Assessment/Plan: 1. Stage II colon cancer (T3 N0) of the cecum/appendix, status post a right hemicolectomy and bilateral oophorectomy 04/02/2009. She underwent an initial appendectomy/cecum resection on 02/21/2009 confirming an adenocarcinoma of the cecum.  2. History of coronary artery disease.  3. Colonoscopy 01/26/2014 by Dr. Henrene Pastor. Status post right hemicolectomy, colon otherwise normal. Next colonoscopy recommended in 5 years.  4. Microcytic anemia 12/05/2013. Colonoscopy 01/26/2014 with results as above. Upper endoscopy 03/08/2014 with findings of atrophic gastric mucosa. Otherwise normal EGD to the third portion of the duodenum status post duodenal biopsies. Pathology showed benign small bowel mucosa. No active inflammation or  villous atrophy identified. Capsule endoscopy recommended.    Disposition: Brenda Lara appears stable. She remains in clinical remission from colon cancer.   The etiology of the microcytic anemia 12/05/2013 remains unclear. The red cells indices at that time were consistent with iron deficiency. The hemoglobin corrected into normal range with oral iron. She has undergone a colonoscopy and upper endoscopy with no source of blood loss identified. A capsule endoscopy has been recommended.  She plans to contact Dr. Blanch Media office to schedule the capsule endoscopy.  Dr. Benay Spice recommends she discontinue oral iron at this time and return for a followup visit and CBC in 4 months. If she develops signs/symptoms suggestive of progressive anemia she will contact the office.    Ned Card ANP/GNP-BC   06/26/2014  2:14 PM

## 2014-06-27 LAB — CEA

## 2014-06-28 ENCOUNTER — Telehealth: Payer: Self-pay | Admitting: *Deleted

## 2014-06-28 NOTE — Telephone Encounter (Signed)
Message copied by Wardell Heath on Wed Jun 28, 2014  9:08 AM ------      Message from: Brenda Lara      Created: Tue Jun 27, 2014  5:49 PM       Please call patient, cea is normal ------

## 2014-06-28 NOTE — Telephone Encounter (Signed)
Left message for patient to call back regarding lab results.

## 2014-07-04 ENCOUNTER — Telehealth: Payer: Self-pay | Admitting: *Deleted

## 2014-07-04 NOTE — Telephone Encounter (Signed)
Left VM that lab results are normal.

## 2014-07-17 ENCOUNTER — Encounter: Payer: Self-pay | Admitting: Internal Medicine

## 2014-07-26 ENCOUNTER — Telehealth: Payer: Self-pay

## 2014-07-26 ENCOUNTER — Other Ambulatory Visit: Payer: Self-pay | Admitting: Internal Medicine

## 2014-07-26 ENCOUNTER — Other Ambulatory Visit: Payer: Self-pay

## 2014-07-26 MED ORDER — SIMVASTATIN 40 MG PO TABS: ORAL_TABLET | ORAL | Status: DC

## 2014-07-26 NOTE — Telephone Encounter (Signed)
Refill request

## 2014-08-04 NOTE — Telephone Encounter (Signed)
dj

## 2014-08-22 ENCOUNTER — Other Ambulatory Visit: Payer: Self-pay | Admitting: Internal Medicine

## 2014-08-22 NOTE — Telephone Encounter (Signed)
Refill request

## 2014-09-06 ENCOUNTER — Encounter: Payer: Self-pay | Admitting: *Deleted

## 2014-10-19 ENCOUNTER — Other Ambulatory Visit: Payer: Self-pay | Admitting: Internal Medicine

## 2014-10-19 NOTE — Telephone Encounter (Signed)
Refill request

## 2014-10-27 ENCOUNTER — Telehealth: Payer: Self-pay | Admitting: Oncology

## 2014-10-27 NOTE — Telephone Encounter (Signed)
Pt called to r/s due to work conflict, pt confirmed labs/ov ...Marland KitchenMarland KitchenMarland Kitchen KJ

## 2014-10-31 ENCOUNTER — Ambulatory Visit: Payer: 59 | Admitting: Oncology

## 2014-10-31 ENCOUNTER — Other Ambulatory Visit: Payer: 59

## 2014-11-14 ENCOUNTER — Other Ambulatory Visit: Payer: Self-pay | Admitting: Internal Medicine

## 2014-11-14 DIAGNOSIS — Z1231 Encounter for screening mammogram for malignant neoplasm of breast: Secondary | ICD-10-CM

## 2014-11-15 ENCOUNTER — Other Ambulatory Visit: Payer: Self-pay | Admitting: Internal Medicine

## 2014-11-15 NOTE — Telephone Encounter (Signed)
Refill request

## 2014-11-23 ENCOUNTER — Other Ambulatory Visit (HOSPITAL_BASED_OUTPATIENT_CLINIC_OR_DEPARTMENT_OTHER): Payer: 59

## 2014-11-23 ENCOUNTER — Ambulatory Visit (HOSPITAL_BASED_OUTPATIENT_CLINIC_OR_DEPARTMENT_OTHER): Payer: 59 | Admitting: Oncology

## 2014-11-23 VITALS — BP 156/67 | HR 66 | Temp 98.2°F | Resp 17 | Ht 61.0 in | Wt 140.9 lb

## 2014-11-23 DIAGNOSIS — Z85038 Personal history of other malignant neoplasm of large intestine: Secondary | ICD-10-CM

## 2014-11-23 DIAGNOSIS — C189 Malignant neoplasm of colon, unspecified: Secondary | ICD-10-CM

## 2014-11-23 DIAGNOSIS — D649 Anemia, unspecified: Secondary | ICD-10-CM

## 2014-11-23 DIAGNOSIS — Z853 Personal history of malignant neoplasm of breast: Secondary | ICD-10-CM

## 2014-11-23 LAB — CBC WITH DIFFERENTIAL/PLATELET
BASO%: 0.4 % (ref 0.0–2.0)
Basophils Absolute: 0 10*3/uL (ref 0.0–0.1)
EOS ABS: 0 10*3/uL (ref 0.0–0.5)
EOS%: 0.6 % (ref 0.0–7.0)
HCT: 38.5 % (ref 34.8–46.6)
HEMOGLOBIN: 12.3 g/dL (ref 11.6–15.9)
LYMPH%: 31.1 % (ref 14.0–49.7)
MCH: 27.6 pg (ref 25.1–34.0)
MCHC: 31.9 g/dL (ref 31.5–36.0)
MCV: 86.5 fL (ref 79.5–101.0)
MONO#: 0.5 10*3/uL (ref 0.1–0.9)
MONO%: 9.1 % (ref 0.0–14.0)
NEUT#: 3.3 10*3/uL (ref 1.5–6.5)
NEUT%: 58.8 % (ref 38.4–76.8)
Platelets: 266 10*3/uL (ref 145–400)
RBC: 4.46 10*6/uL (ref 3.70–5.45)
RDW: 12.9 % (ref 11.2–14.5)
WBC: 5.6 10*3/uL (ref 3.9–10.3)
lymph#: 1.7 10*3/uL (ref 0.9–3.3)

## 2014-11-23 NOTE — Progress Notes (Addendum)
  [facility] OFFICE PROGRESS NOTE   Diagnosis: Colon cancer  INTERVAL HISTORY:   Brenda Lara returns as scheduled. She feels well. No bleeding. She is not taking iron. She has migraine headaches.  Objective:  Vital signs in last 24 hours:  Blood pressure 156/67, pulse 66, temperature 98.2 F (36.8 C), temperature source Oral, resp. rate 17, height 5\' 1"  (1.549 m), weight 140 lb 14.4 oz (63.912 kg), SpO2 100 %.    HEENT: Neck without mass Lymphatics: No cervical, supra-clavicular, axillary, or inguinal nodes Resp: Lungs clear bilaterally Cardio: Regular rate and rhythm GI: No hepatosplenomegaly Vascular: No leg edema   Lab Results:  Lab Results  Component Value Date   WBC 5.6 11/23/2014   HGB 12.3 11/23/2014   HCT 38.5 11/23/2014   MCV 86.5 11/23/2014   PLT 266 11/23/2014   NEUTROABS 3.3 11/23/2014      Lab Results  Component Value Date   CEA <0.5 06/26/2014    Medications: I have reviewed the patient's current medications.  Assessment/Plan: 1. Stage II colon cancer (T3 N0) of the cecum/appendix, status post a right hemicolectomy and bilateral oophorectomy 04/02/2009. She underwent an initial appendectomy/cecum resection on 02/21/2009 confirming an adenocarcinoma of the cecum.  2. History of coronary artery disease.  3. Colonoscopy 01/26/2014 by Dr. Henrene Lara. Status post right hemicolectomy, colon otherwise normal. Next colonoscopy recommended in 5 years.  4. Microcytic anemia 12/05/2013. Colonoscopy 01/26/2014 with results as above. Upper endoscopy 03/08/2014 with findings of atrophic gastric mucosa. Otherwise normal EGD to the third portion of the duodenum status post duodenal biopsies. Pathology showed benign small bowel mucosa. No active inflammation or villous atrophy identified. Capsule endoscopy recommended.    Disposition:  Brenda Lara remains in clinical remission from colon cancer. She has an excellent prognosis for a long-term  disease-free survival. She will continue colonoscopy follow-up with Dr. Henrene Lara.  She is no longer anemic. The etiology of the iron deficiency anemia last year is unclear. She plans to have the hemoglobin checked with her primary physician.  She is not scheduled for a follow-up appointment at the [facility]. I am available to see her in the future as needed.  Betsy Coder, MD  11/23/2014  4:02 PM

## 2014-11-24 LAB — FERRITIN CHCC: FERRITIN: 16 ng/mL (ref 9–269)

## 2014-11-28 ENCOUNTER — Telehealth: Payer: Self-pay | Admitting: *Deleted

## 2014-11-28 NOTE — Telephone Encounter (Signed)
Per Dr. Benay Spice; left voice message on home #,  ferritin is in the low normal range, but sure your hemoglobin is checked by your PCP; call if any question or want to discuss information.

## 2014-11-28 NOTE — Telephone Encounter (Signed)
-----   Message from Ladell Pier, MD sent at 11/27/2014  8:26 PM EDT ----- Please call patient, ferritin in low normal range, be sure hemoglobin is checked with Dr.Schoenhoff We can see her as needed

## 2014-12-12 ENCOUNTER — Ambulatory Visit (HOSPITAL_BASED_OUTPATIENT_CLINIC_OR_DEPARTMENT_OTHER)
Admission: RE | Admit: 2014-12-12 | Discharge: 2014-12-12 | Disposition: A | Discharge status: Home / Self Care | Payer: 59 | Source: Ambulatory Visit | Attending: Internal Medicine | Admitting: Internal Medicine

## 2014-12-12 DIAGNOSIS — Z1231 Encounter for screening mammogram for malignant neoplasm of breast: Secondary | ICD-10-CM | POA: Diagnosis not present

## 2015-01-12 ENCOUNTER — Other Ambulatory Visit: Payer: Self-pay | Admitting: Internal Medicine

## 2015-01-23 NOTE — Progress Notes (Signed)
Patient ID: Brenda Lara, female   DOB: Oct 28, 1950, 64 y.o.   MRN: [id number] This is a  64 y.o. white female patient with history of coronary artery disease, status post stenting of the RCA in 2006 and LAD in 2005. Repeat cardiac catheterization, May 31, 2009.  showed the patient's stents were widely patent and she had no other significant disease. Her ejection fraction was 55% with mild apical hypokinesis. No chest pain primary following cholesterol LDL under 100 in February  Non angina compliant with meds   S/P right hemicolectomy for colon cancer now in remission.  F/U colonoscopy June 2015 by Dr Henrene Pastor normal  Having some vague pains in chest and shoulder.  Sometimes exertional Not lasting long Not related to food SSCP and shoulders bilaterally  ROS: Denies fever, malais, weight loss, blurry vision, decreased visual acuity, cough, sputum, SOB, hemoptysis, pleuritic pain, palpitaitons, heartburn, abdominal pain, melena, lower extremity edema, claudication, or rash.  All other systems reviewed and negative  General: Affect appropriate Healthy:  appears stated age 64: normal Neck supple with no adenopathy JVP normal no bruits no thyromegaly Lungs clear with no wheezing and good diaphragmatic motion Heart:  S1/S2 no murmur, no rub, gallop or click PMI normal Abdomen: benighn, BS positve, no tenderness, no AAA no bruit.  No HSM or HJR Distal pulses intact with no bruits No edema Neuro non-focal Skin warm and dry No muscular weakness   Current Outpatient Prescriptions  Medication Sig Dispense Refill  . aspirin 325 MG tablet Take 325 mg by mouth daily.      Marland Kitchen atenolol (TENORMIN) 25 MG tablet TAKE 1 TABLET (25 MG TOTAL) BY MOUTH 2 (TWO) TIMES DAILY. 180 tablet 0  . baclofen (LIORESAL) 10 MG tablet 10 mg as needed.     . FeFum-FePoly-FA-B Cmp-C-Biot (INTEGRA PLUS) CAPS TAKE 1 CAPSULE BY MOUTH DAILY. (Patient not taking: Reported on 11/23/2014) 90 capsule 0  . meclizine  (ANTIVERT) 25 MG tablet Take 25 mg by mouth 4 (four) times daily as needed.      . nitroGLYCERIN (NITROSTAT) 0.4 MG SL tablet Place 1 tablet (0.4 mg total) under the tongue every 5 (five) minutes as needed for chest pain. 25 tablet 4  . omeprazole (PRILOSEC) 20 MG capsule TAKE ONE CAPSULE BY MOUTH EVERY DAY 90 capsule 0  . simvastatin (ZOCOR) 40 MG tablet TAKE 1 TABLET (40 MG TOTAL) BY MOUTH EVERY EVENING. 30 tablet 5  . topiramate (TOPAMAX) 100 MG tablet 100 mg daily.     . vitamin C (ASCORBIC ACID) 500 MG tablet Take 500 mg by mouth daily.     No current facility-administered medications for this visit.    Allergies  Review of patient's allergies indicates no known allergies.  Electrocardiogram:  01/20/14  NSR rate 67 normal ECG  01/24/15 SR rate 57  Normal ECG   Assessment and Plan CAD:  Distant history of RCA/LAD stenting  Patent by cath in 2010.  Vague chest symptoms normal ECG continue ASA and beta blocker F/u ETT  Chol:  On statin  Cholesterol is at goal.  Continue current dose of statin and diet Rx.  No myalgias or side effects.  F/U  LFT's in 6 months. Lab Results  Component Value Date   LDLCALC 59 12/09/2012   Repeat cholesterol today  Colon Cancer:  Colonoscopy normal last year in remission fu GI  F/U with me in a year if ETT normal

## 2015-01-24 ENCOUNTER — Encounter: Payer: Self-pay | Admitting: Cardiovascular Disease

## 2015-01-24 ENCOUNTER — Ambulatory Visit (INDEPENDENT_AMBULATORY_CARE_PROVIDER_SITE_OTHER): Payer: 59 | Admitting: Cardiovascular Disease

## 2015-01-24 VITALS — BP 150/78 | HR 57 | Ht 61.0 in | Wt 140.8 lb

## 2015-01-24 DIAGNOSIS — Z79899 Other long term (current) drug therapy: Secondary | ICD-10-CM | POA: Diagnosis not present

## 2015-01-24 DIAGNOSIS — R9431 Abnormal electrocardiogram [ECG] [EKG]: Secondary | ICD-10-CM

## 2015-01-24 DIAGNOSIS — E7800 Pure hypercholesterolemia, unspecified: Secondary | ICD-10-CM

## 2015-01-24 NOTE — Patient Instructions (Addendum)
Medication Instructions:  NO CHANGES  Labwork:   TODAY FASTING LIPID LIVER   Testing/Procedures Your physician has requested that you have an exercise tolerance test. For further information please visit [email or web address]. Please also follow instruction sheet, as given.  Follow-Up: Your physician wants you to follow-up in: [place] will receive a reminder letter in the mail two months in advance. If you don't receive a letter, please call our office to schedule the follow-up appointment.   Any Other Special Instructions Will Be Listed Below (If Applicable).

## 2015-01-25 LAB — LIPID PANEL
Cholesterol: 154 mg/dL (ref 0–200)
HDL: 61.8 mg/dL (ref >39.00)
LDL CALC: 71 mg/dL (ref 0–99)
NonHDL: 92.2
TRIGLYCERIDES: 108 mg/dL (ref 0.0–149.0)
Total CHOL/HDL Ratio: 2
VLDL: 21.6 mg/dL (ref 0.0–40.0)

## 2015-01-25 LAB — HEPATIC FUNCTION PANEL
ALK PHOS: 93 U/L (ref 39–117)
ALT: 15 U/L (ref 0–35)
AST: 22 U/L (ref 0–37)
Albumin: 4 g/dL (ref 3.5–5.2)
BILIRUBIN DIRECT: 0.1 mg/dL (ref 0.0–0.3)
Total Bilirubin: 0.6 mg/dL (ref 0.2–1.2)
Total Protein: 7 g/dL (ref 6.0–8.3)

## 2015-01-26 ENCOUNTER — Telehealth: Payer: Self-pay | Admitting: Cardiovascular Disease

## 2015-01-26 NOTE — Telephone Encounter (Signed)
PT AWARE OF LAB RESULTS./CY 

## 2015-01-26 NOTE — Telephone Encounter (Signed)
New Message  Pt returning Christine's phone call concerning lab work. Please call back and dsicss,

## 2015-02-08 ENCOUNTER — Other Ambulatory Visit: Payer: Self-pay

## 2015-02-08 MED ORDER — SIMVASTATIN 40 MG PO TABS: ORAL_TABLET | ORAL | Status: DC

## 2015-02-08 NOTE — Telephone Encounter (Signed)
Per note 5.11.16 

## 2015-02-13 ENCOUNTER — Other Ambulatory Visit: Payer: Self-pay | Admitting: Internal Medicine

## 2015-02-20 ENCOUNTER — Telehealth (HOSPITAL_COMMUNITY): Payer: Self-pay

## 2015-02-20 NOTE — Telephone Encounter (Signed)
Encounter complete. 

## 2015-02-21 ENCOUNTER — Telehealth (HOSPITAL_COMMUNITY): Payer: Self-pay

## 2015-02-21 NOTE — Telephone Encounter (Signed)
Encounter complete. 

## 2015-02-22 ENCOUNTER — Ambulatory Visit (HOSPITAL_COMMUNITY)
Admission: RE | Admit: 2015-02-22 | Discharge: 2015-02-22 | Disposition: A | Discharge status: Home / Self Care | Payer: 59 | Source: Ambulatory Visit | Attending: Cardiovascular Disease | Admitting: Cardiovascular Disease

## 2015-02-22 DIAGNOSIS — R9431 Abnormal electrocardiogram [ECG] [EKG]: Secondary | ICD-10-CM | POA: Insufficient documentation

## 2015-02-22 LAB — EXERCISE TOLERANCE TEST
CHL CUP MPHR: 157 {beats}/min
CSEPEW: 7 METS
Exercise duration (min): 6 min
Peak HR: 162 {beats}/min
Percent HR: 103 %
RPE: 14
Rest HR: 127 {beats}/min

## 2015-07-26 ENCOUNTER — Ambulatory Visit: Payer: 59 | Admitting: Family Medicine

## 2015-07-27 ENCOUNTER — Encounter: Payer: Self-pay | Admitting: Family Medicine

## 2015-07-27 ENCOUNTER — Ambulatory Visit: Payer: 59 | Admitting: Family Medicine

## 2015-07-27 ENCOUNTER — Ambulatory Visit (INDEPENDENT_AMBULATORY_CARE_PROVIDER_SITE_OTHER): Payer: 59 | Admitting: Family Medicine

## 2015-07-27 VITALS — BP 159/72 | HR 63 | Temp 98.1°F | Ht 61.0 in | Wt 143.5 lb

## 2015-07-27 DIAGNOSIS — K219 Gastro-esophageal reflux disease without esophagitis: Secondary | ICD-10-CM

## 2015-07-27 DIAGNOSIS — R51 Headache: Secondary | ICD-10-CM

## 2015-07-27 DIAGNOSIS — H9193 Unspecified hearing loss, bilateral: Secondary | ICD-10-CM

## 2015-07-27 DIAGNOSIS — J339 Nasal polyp, unspecified: Secondary | ICD-10-CM | POA: Diagnosis not present

## 2015-07-27 DIAGNOSIS — Z1239 Encounter for other screening for malignant neoplasm of breast: Secondary | ICD-10-CM | POA: Diagnosis not present

## 2015-07-27 DIAGNOSIS — E782 Mixed hyperlipidemia: Secondary | ICD-10-CM | POA: Diagnosis not present

## 2015-07-27 DIAGNOSIS — I10 Essential (primary) hypertension: Secondary | ICD-10-CM | POA: Diagnosis not present

## 2015-07-27 DIAGNOSIS — Z780 Asymptomatic menopausal state: Secondary | ICD-10-CM

## 2015-07-27 DIAGNOSIS — E7800 Pure hypercholesterolemia, unspecified: Secondary | ICD-10-CM

## 2015-07-27 DIAGNOSIS — R519 Headache, unspecified: Secondary | ICD-10-CM

## 2015-07-27 DIAGNOSIS — Z8619 Personal history of other infectious and parasitic diseases: Secondary | ICD-10-CM | POA: Insufficient documentation

## 2015-07-27 MED ORDER — MECLIZINE HCL 25 MG PO TABS: 25.0000 mg | ORAL_TABLET | Freq: Four times a day (QID) | ORAL | Status: DC | PRN

## 2015-07-27 MED ORDER — ATENOLOL 25 MG PO TABS: 25.0000 mg | ORAL_TABLET | Freq: Three times a day (TID) | ORAL | Status: DC

## 2015-07-27 MED ORDER — FLUTICASONE PROPIONATE 50 MCG/ACT NA SUSP: 2.0000 | Freq: Every day | NASAL | Status: DC

## 2015-07-27 MED ORDER — OMEPRAZOLE 20 MG PO CPDR: 20.0000 mg | DELAYED_RELEASE_CAPSULE | Freq: Every day | ORAL | Status: DC

## 2015-07-27 NOTE — Progress Notes (Signed)
Pre visit review using our clinic review tool, if applicable. No additional management support is needed unless otherwise documented below in the visit note. 

## 2015-07-27 NOTE — Patient Instructions (Signed)
Follow BP, minimize sodium, minimize caffeine, get regular exercise, 6-8 hours of sleep  Report BP via My chart   Hypertension Hypertension, commonly called high blood pressure, is when the force of blood pumping through your arteries is too strong. Your arteries are the blood vessels that carry blood from your heart throughout your body. A blood pressure reading consists of a higher number over a lower number, such as 110/72. The higher number (systolic) is the pressure inside your arteries when your heart pumps. The lower number (diastolic) is the pressure inside your arteries when your heart relaxes. Ideally you want your blood pressure below 120/80. Hypertension forces your heart to work harder to pump blood. Your arteries may become narrow or stiff. Having untreated or uncontrolled hypertension can cause heart attack, stroke, kidney disease, and other problems. RISK FACTORS Some risk factors for high blood pressure are controllable. Others are not.  Risk factors you cannot control include:   Race. You may be at higher risk if you are African American.  Age. Risk increases with age.  Gender. Men are at higher risk than women before age 60 years. After age 32, women are at higher risk than men. Risk factors you can control include:  Not getting enough exercise or physical activity.  Being overweight.  Getting too much fat, sugar, calories, or salt in your diet.  Drinking too much alcohol. SIGNS AND SYMPTOMS Hypertension does not usually cause signs or symptoms. Extremely high blood pressure (hypertensive crisis) may cause headache, anxiety, shortness of breath, and nosebleed. DIAGNOSIS To check if you have hypertension, your health care provider will measure your blood pressure while you are seated, with your arm held at the level of your heart. It should be measured at least twice using the same arm. Certain conditions can cause a difference in blood pressure between your right and  left arms. A blood pressure reading that is higher than normal on one occasion does not mean that you need treatment. If it is not clear whether you have high blood pressure, you may be asked to return on a different day to have your blood pressure checked again. Or, you may be asked to monitor your blood pressure at home for 1 or more weeks. TREATMENT Treating high blood pressure includes making lifestyle changes and possibly taking medicine. Living a healthy lifestyle can help lower high blood pressure. You may need to change some of your habits. Lifestyle changes may include:  Following the DASH diet. This diet is high in fruits, vegetables, and whole grains. It is low in salt, red meat, and added sugars.  Keep your sodium intake below 2,300 mg per day.  Getting at least 30-45 minutes of aerobic exercise at least 4 times per week.  Losing weight if necessary.  Not smoking.  Limiting alcoholic beverages.  Learning ways to reduce stress. Your health care provider may prescribe medicine if lifestyle changes are not enough to get your blood pressure under control, and if one of the following is true:  You are 63-28 years of age and your systolic blood pressure is above 140.  You are 63 years of age or older, and your systolic blood pressure is above 150.  Your diastolic blood pressure is above 90.  You have diabetes, and your systolic blood pressure is over [id number] or your diastolic blood pressure is over 90.  You have kidney disease and your blood pressure is above 140/90.  You have heart disease and your blood pressure is above  140/90. Your personal target blood pressure may vary depending on your medical conditions, your age, and other factors. HOME CARE INSTRUCTIONS  Have your blood pressure rechecked as directed by your health care provider.   Take medicines only as directed by your health care provider. Follow the directions carefully. Blood pressure medicines must be taken as  prescribed. The medicine does not work as well when you skip doses. Skipping doses also puts you at risk for problems.  Do not smoke.   Monitor your blood pressure at home as directed by your health care provider. SEEK MEDICAL CARE IF:   You think you are having a reaction to medicines taken.  You have recurrent headaches or feel dizzy.  You have swelling in your ankles.  You have trouble with your vision. SEEK IMMEDIATE MEDICAL CARE IF:  You develop a severe headache or confusion.  You have unusual weakness, numbness, or feel faint.  You have severe chest or abdominal pain.  You vomit repeatedly.  You have trouble breathing. MAKE SURE YOU:   Understand these instructions.  Will watch your condition.  Will get help right away if you are not doing well or get worse.   This information is not intended to replace advice given to you by your health care provider. Make sure you discuss any questions you have with your health care provider.   Document Released: 09/01/2005 Document Revised: 01/16/2015 Document Reviewed: 06/24/2013 Elsevier Interactive Patient Education [employer].

## 2015-07-30 ENCOUNTER — Encounter: Payer: Self-pay | Admitting: Family Medicine

## 2015-08-05 ENCOUNTER — Encounter: Payer: Self-pay | Admitting: Family Medicine

## 2015-08-05 DIAGNOSIS — H9190 Unspecified hearing loss, unspecified ear: Secondary | ICD-10-CM

## 2015-08-05 HISTORY — DX: Unspecified hearing loss, unspecified ear: H91.90

## 2015-08-05 NOTE — Assessment & Plan Note (Signed)
Avoid offending foods, start probiotics. Do not eat large meals in late evening and consider raising head of bed.  

## 2015-08-05 NOTE — Assessment & Plan Note (Signed)
X roughly 3 weeks with an echo sensation. Claritin not helpful. Encouraged daily antihistamines, flonase and nasal saline and is referred to ENT for further consideration

## 2015-08-05 NOTE — Assessment & Plan Note (Signed)
Encouraged increased hydration, 64 ounces of clear fluids daily. Minimize alcohol and caffeine. Eat small frequent meals with lean proteins and complex carbs. Avoid high and low blood sugars. Get adequate sleep, 7-8 hours a night. Needs exercise daily preferably in the morning.  

## 2015-08-05 NOTE — Assessment & Plan Note (Signed)
Tolerating statin, encouraged heart healthy diet, avoid trans fats, minimize simple carbs and saturated fats. Increase exercise as tolerated 

## 2015-08-05 NOTE — Progress Notes (Signed)
Subjective:    Patient ID: Brenda Lara, female    DOB: [birth year], 64 y.o.   MRN: [id number]  Chief Complaint  Patient presents with  . Establish Care    HPI Patient is in today for new patient appointment. Her previous physician has retired. Her greatest complaint is of head. She notes her head feels full pressure especially when she lies down. She gets an echoing sensation in her ears and has noted some hearing loss over the last couple weeks right greater than left. Did try Claritin with no great effect. Does acknowledge some pruritus. No other acute complaints. Uses omeprazole as needed for heartburn with good results. Denies CP/palp/SOB/HA/congestion/fevers/GI or GU c/o. Taking meds as prescribed  Past Medical History  Diagnosis Date  . HTN (hypertension)   . GERD (gastroesophageal reflux disease)   . Pure hypercholesterolemia   . Angina pectoris   . Menopause   . Anemia   . Anxiety   . Coronary artery disease 2001/2005    S/P stenting to RCA and LAD  . Colon cancer ([place]) 2010  . History of chicken pox   . Hearing loss 08/05/2015    Past Surgical History  Procedure Laterality Date  . Coronary stent placement  2001/2005    RCA  . Back surgery  1978, 1988    multiple  . Cystectomy      left ovary  . Colon surgery  2010    right colectomy  . Bilateral oophorectomy  2010  . Appendectomy  02/21/2009  . Cholecystectomy  11/09  . Colonoscopy  01/26/2014    NORMAL     Family History  Problem Relation Age of Onset  . Coronary artery disease    . Hypertension Mother   . Transient ischemic attack Mother   . Heart attack Mother     age 25  . Heart disease Mother   . Heart attack Father     44's  . Cancer Father     prostate  . Heart disease Father   . Alcohol abuse Father   . Arthritis Father     mva  . Colon cancer Neg Hx   . Pancreatic cancer Neg Hx   . Rectal cancer Neg Hx   . Stomach cancer Neg Hx     Social History   Social History  . Marital  Status: Married    Spouse Name: N/A  . Number of Children: N/A  . Years of Education: N/A   Occupational History  . Not on file.   Social History Main Topics  . Smoking status: Never Smoker   . Smokeless tobacco: Never Used  . Alcohol Use: No  . Drug Use: No  . 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 Not on file   Social History Narrative    Outpatient Prescriptions Prior to Visit  Medication Sig Dispense Refill  . aspirin 325 MG tablet Take 325 mg by mouth daily.      . baclofen (LIORESAL) 10 MG tablet 10 mg as needed.     . nitroGLYCERIN (NITROSTAT) 0.4 MG SL tablet Place 1 tablet (0.4 mg total) under the tongue every 5 (five) minutes as needed for chest pain. 25 tablet 4  . simvastatin (ZOCOR) 40 MG tablet TAKE 1 TABLET (40 MG TOTAL) BY MOUTH EVERY EVENING. 30 tablet 10  . topiramate (TOPAMAX)  100 MG tablet 100 mg daily.     . vitamin C (ASCORBIC ACID) 500 MG tablet Take 500 mg by mouth daily.    Marland Kitchen atenolol (TENORMIN) 25 MG tablet TAKE 1 TABLET (25 MG TOTAL) BY MOUTH 2 (TWO) TIMES DAILY. 180 tablet 0  . meclizine (ANTIVERT) 25 MG tablet Take 25 mg by mouth 4 (four) times daily as needed.      Marland Kitchen omeprazole (PRILOSEC) 20 MG capsule TAKE ONE CAPSULE BY MOUTH EVERY DAY 90 capsule 0   No facility-administered medications prior to visit.    No Known Allergies  Review of Systems  Constitutional: Negative for fever, chills and malaise/fatigue.  HENT: Positive for congestion and hearing loss.   Eyes: Negative for discharge.  Respiratory: Negative for cough, sputum production and shortness of breath.   Cardiovascular: Negative for chest pain, palpitations and leg swelling.  Gastrointestinal: Positive for heartburn. Negative for nausea, vomiting, abdominal pain, diarrhea, constipation and blood in stool.  Genitourinary: Negative for dysuria, urgency,  frequency and hematuria.  Musculoskeletal: Negative for myalgias, back pain and falls.  Skin: Negative for rash.  Neurological: Negative for dizziness, sensory change, loss of consciousness, weakness and headaches.  Endo/Heme/Allergies: Negative for environmental allergies. Does not bruise/bleed easily.  Psychiatric/Behavioral: Negative for depression and suicidal ideas. The patient is not nervous/anxious and does not have insomnia.        Objective:    Physical Exam  Constitutional: She is oriented to person, place, and time. She appears well-developed and well-nourished. No distress.  HENT:  Head: Normocephalic and atraumatic.  Nose: Nose normal.  Eyes: Right eye exhibits no discharge. Left eye exhibits no discharge.  Neck: Normal range of motion. Neck supple.  Cardiovascular: Normal rate and regular rhythm.   No murmur heard. Pulmonary/Chest: Effort normal and breath sounds normal.  Abdominal: Soft. Bowel sounds are normal. There is no tenderness.  Musculoskeletal: She exhibits no edema.  Neurological: She is alert and oriented to person, place, and time.  Skin: Skin is warm and dry.  Psychiatric: She has a normal mood and affect.  Nursing note and vitals reviewed.   BP 159/72 mmHg  Pulse 63  Temp(Src) 98.1 F (36.7 C) (Oral)  Ht 5\' 1"  (1.549 m)  Wt 143 lb 8 oz (65.091 kg)  BMI 27.13 kg/m2  SpO2 100% Wt Readings from Last 3 Encounters:  07/27/15 143 lb 8 oz (65.091 kg)  01/24/15 140 lb 12.8 oz (63.866 kg)  11/23/14 140 lb 14.4 oz (63.912 kg)     Lab Results  Component Value Date   WBC 5.6 11/23/2014   HGB 12.3 11/23/2014   HCT 38.5 11/23/2014   PLT 266 11/23/2014   GLUCOSE 107* 12/05/2013   CHOL 154 01/24/2015   TRIG 108.0 01/24/2015   HDL 61.80 01/24/2015   LDLCALC 71 01/24/2015   ALT 15 01/24/2015   AST 22 01/24/2015   NA 139 12/05/2013   K 4.7 12/05/2013   CL 108 12/05/2013   CREATININE 0.63 12/05/2013   BUN 8 12/05/2013   CO2 23 12/05/2013   TSH  2.479 07/02/2011   INR 0.95 05/13/2011    Lab Results  Component Value Date   TSH 2.479 07/02/2011   Lab Results  Component Value Date   WBC 5.6 11/23/2014   HGB 12.3 11/23/2014   HCT 38.5 11/23/2014   MCV 86.5 11/23/2014   PLT 266 11/23/2014   Lab Results  Component Value Date   NA 139 12/05/2013   K 4.7 12/05/2013  CO2 23 12/05/2013   GLUCOSE 107* 12/05/2013   BUN 8 12/05/2013   CREATININE 0.63 12/05/2013   BILITOT 0.6 01/24/2015   ALKPHOS 93 01/24/2015   AST 22 01/24/2015   ALT 15 01/24/2015   PROT 7.0 01/24/2015   ALBUMIN 4.0 01/24/2015   CALCIUM 9.3 12/05/2013   Lab Results  Component Value Date   CHOL 154 01/24/2015   Lab Results  Component Value Date   HDL 61.80 01/24/2015   Lab Results  Component Value Date   LDLCALC 71 01/24/2015   Lab Results  Component Value Date   TRIG 108.0 01/24/2015   Lab Results  Component Value Date   CHOLHDL 2 01/24/2015   No results found for: HGBA1C     Assessment & Plan:   Problem List Items Addressed This Visit    Essential hypertension - Primary    Well controlled, no changes to meds. Encouraged heart healthy diet such as the DASH diet and exercise as tolerated.       Relevant Medications   atenolol (TENORMIN) 25 MG tablet   Other Relevant Orders   TSH   CBC   Lipid panel   Comprehensive metabolic panel   GERD    Avoid offending foods, start probiotics. Do not eat large meals in late evening and consider raising head of bed.       Relevant Medications   omeprazole (PRILOSEC) 20 MG capsule   meclizine (ANTIVERT) 25 MG tablet   Headache    Encouraged increased hydration, 64 ounces of clear fluids daily. Minimize alcohol and caffeine. Eat small frequent meals with lean proteins and complex carbs. Avoid high and low blood sugars. Get adequate sleep, 7-8 hours a night. Needs exercise daily preferably in the morning.      Relevant Medications   atenolol (TENORMIN) 25 MG tablet   Hearing loss    X  roughly 3 weeks with an echo sensation. Claritin not helpful. Encouraged daily antihistamines, flonase and nasal saline and is referred to ENT for further consideration      HYPERCHOLESTEROLEMIA    Tolerating statin, encouraged heart healthy diet, avoid trans fats, minimize simple carbs and saturated fats. Increase exercise as tolerated      Relevant Medications   atenolol (TENORMIN) 25 MG tablet    Other Visit Diagnoses    Hyperlipidemia, mixed        Relevant Medications    atenolol (TENORMIN) 25 MG tablet    Other Relevant Orders    TSH    CBC    Lipid panel    Comprehensive metabolic panel    Nasal polyp        Relevant Medications    fluticasone (FLONASE) 50 MCG/ACT nasal spray    Other Relevant Orders    Ambulatory referral to ENT    TSH    CBC    Lipid panel    Comprehensive metabolic panel    Screening for breast cancer        Relevant Orders    MM Digital Screening    Postmenopausal estrogen deficiency        Relevant Orders    DG Bone Density       I have changed Ms. Spieler's omeprazole, atenolol, and meclizine. I am also having her start on fluticasone. Additionally, I am having her maintain her aspirin, nitroGLYCERIN, baclofen, topiramate, vitamin C, simvastatin, and NON FORMULARY.  Meds ordered this encounter  Medications  . NON FORMULARY    Sig: IDEAS supplements Take 2  in the morning and 2 in the evening.  Marland Kitchen omeprazole (PRILOSEC) 20 MG capsule    Sig: Take 1 capsule (20 mg total) by mouth daily.    Dispense:  90 capsule    Refill:  1  . atenolol (TENORMIN) 25 MG tablet    Sig: Take 1 tablet (25 mg total) by mouth 3 (three) times daily.    Dispense:  180 tablet    Refill:  0  . meclizine (ANTIVERT) 25 MG tablet    Sig: Take 1 tablet (25 mg total) by mouth 4 (four) times daily as needed.    Dispense:  30 tablet    Refill:  1  . fluticasone (FLONASE) 50 MCG/ACT nasal spray    Sig: Place 2 sprays into both nostrils daily.    Dispense:  16 g     Refill:  6     Penni Homans, MD

## 2015-08-05 NOTE — Assessment & Plan Note (Signed)
Well controlled, no changes to meds. Encouraged heart healthy diet such as the DASH diet and exercise as tolerated.  °

## 2015-10-10 ENCOUNTER — Other Ambulatory Visit: Payer: Self-pay | Admitting: Family Medicine

## 2015-11-06 ENCOUNTER — Other Ambulatory Visit: Payer: Self-pay | Admitting: Family Medicine

## 2015-12-24 ENCOUNTER — Other Ambulatory Visit: Payer: Self-pay | Admitting: Cardiovascular Disease

## 2015-12-26 ENCOUNTER — Other Ambulatory Visit: Payer: Self-pay | Admitting: Family Medicine

## 2016-01-24 ENCOUNTER — Other Ambulatory Visit (INDEPENDENT_AMBULATORY_CARE_PROVIDER_SITE_OTHER): Payer: 59

## 2016-01-24 ENCOUNTER — Other Ambulatory Visit: Payer: Self-pay | Admitting: Family Medicine

## 2016-01-24 DIAGNOSIS — E782 Mixed hyperlipidemia: Secondary | ICD-10-CM

## 2016-01-24 DIAGNOSIS — I10 Essential (primary) hypertension: Secondary | ICD-10-CM | POA: Diagnosis not present

## 2016-01-24 DIAGNOSIS — J339 Nasal polyp, unspecified: Secondary | ICD-10-CM

## 2016-01-24 LAB — COMPREHENSIVE METABOLIC PANEL
ALT: 16 U/L (ref 0–35)
AST: 21 U/L (ref 0–37)
Albumin: 4.1 g/dL (ref 3.5–5.2)
Alkaline Phosphatase: 73 U/L (ref 39–117)
BILIRUBIN TOTAL: 0.3 mg/dL (ref 0.2–1.2)
BUN: 13 mg/dL (ref 6–23)
CO2: 24 meq/L (ref 19–32)
Calcium: 9.4 mg/dL (ref 8.4–10.5)
Chloride: 107 mEq/L (ref 96–112)
Creatinine, Ser: 0.79 mg/dL (ref 0.40–1.20)
GFR: 77.77 mL/min (ref >60.00)
GLUCOSE: 103 mg/dL — AB (ref 70–99)
Potassium: 3.7 mEq/L (ref 3.5–5.1)
SODIUM: 140 meq/L (ref 135–145)
Total Protein: 6.8 g/dL (ref 6.0–8.3)

## 2016-01-24 LAB — CBC
HCT: 32.9 % — ABNORMAL LOW (ref 36.0–46.0)
Hemoglobin: 10.4 g/dL — ABNORMAL LOW (ref 12.0–15.0)
MCHC: 31.8 g/dL (ref 30.0–36.0)
MCV: 75.3 fl — ABNORMAL LOW (ref 78.0–100.0)
Platelets: 293 10*3/uL (ref 150.0–400.0)
RBC: 4.37 Mil/uL (ref 3.87–5.11)
RDW: 15.4 % (ref 11.5–15.5)
WBC: 5.9 10*3/uL (ref 4.0–10.5)

## 2016-01-24 LAB — LIPID PANEL
CHOL/HDL RATIO: 3
Cholesterol: 141 mg/dL (ref 0–200)
HDL: 53.5 mg/dL (ref >39.00)
LDL Cholesterol: 64 mg/dL (ref 0–99)
NONHDL: 87.12
Triglycerides: 116 mg/dL (ref 0.0–149.0)
VLDL: 23.2 mg/dL (ref 0.0–40.0)

## 2016-01-24 LAB — TSH: TSH: 2.56 u[IU]/mL (ref 0.35–4.50)

## 2016-01-24 MED ORDER — FERROUS FUMARATE-FOLIC ACID 324-1 MG PO TABS: 1.0000 | ORAL_TABLET | Freq: Every day | ORAL | Status: DC

## 2016-01-28 ENCOUNTER — Ambulatory Visit (HOSPITAL_BASED_OUTPATIENT_CLINIC_OR_DEPARTMENT_OTHER)
Admission: RE | Admit: 2016-01-28 | Discharge: 2016-01-28 | Disposition: A | Discharge status: Home / Self Care | Payer: 59 | Source: Ambulatory Visit | Attending: Family Medicine | Admitting: Family Medicine

## 2016-01-28 DIAGNOSIS — Z1239 Encounter for other screening for malignant neoplasm of breast: Secondary | ICD-10-CM | POA: Diagnosis not present

## 2016-01-28 DIAGNOSIS — M810 Age-related osteoporosis without current pathological fracture: Secondary | ICD-10-CM | POA: Diagnosis not present

## 2016-01-28 DIAGNOSIS — Z780 Asymptomatic menopausal state: Secondary | ICD-10-CM | POA: Insufficient documentation

## 2016-01-28 DIAGNOSIS — Z1231 Encounter for screening mammogram for malignant neoplasm of breast: Secondary | ICD-10-CM | POA: Diagnosis not present

## 2016-01-31 ENCOUNTER — Ambulatory Visit (INDEPENDENT_AMBULATORY_CARE_PROVIDER_SITE_OTHER): Payer: 59 | Admitting: Family Medicine

## 2016-01-31 ENCOUNTER — Encounter: Payer: Self-pay | Admitting: Family Medicine

## 2016-01-31 ENCOUNTER — Other Ambulatory Visit (HOSPITAL_COMMUNITY)
Admission: RE | Admit: 2016-01-31 | Discharge: 2016-01-31 | Disposition: A | Discharge status: Home / Self Care | Payer: 59 | Source: Ambulatory Visit | Attending: Family Medicine | Admitting: Family Medicine

## 2016-01-31 VITALS — BP 122/76 | HR 71 | Temp 98.4°F | Ht 61.0 in | Wt 142.0 lb

## 2016-01-31 DIAGNOSIS — Z01419 Encounter for gynecological examination (general) (routine) without abnormal findings: Secondary | ICD-10-CM | POA: Diagnosis present

## 2016-01-31 DIAGNOSIS — Z0000 Encounter for general adult medical examination without abnormal findings: Secondary | ICD-10-CM

## 2016-01-31 DIAGNOSIS — E7800 Pure hypercholesterolemia, unspecified: Secondary | ICD-10-CM

## 2016-01-31 DIAGNOSIS — Z124 Encounter for screening for malignant neoplasm of cervix: Secondary | ICD-10-CM | POA: Diagnosis not present

## 2016-01-31 DIAGNOSIS — K219 Gastro-esophageal reflux disease without esophagitis: Secondary | ICD-10-CM

## 2016-01-31 DIAGNOSIS — I10 Essential (primary) hypertension: Secondary | ICD-10-CM

## 2016-01-31 DIAGNOSIS — M810 Age-related osteoporosis without current pathological fracture: Secondary | ICD-10-CM

## 2016-01-31 DIAGNOSIS — D649 Anemia, unspecified: Secondary | ICD-10-CM

## 2016-01-31 HISTORY — DX: Encounter for general adult medical examination without abnormal findings: Z00.00

## 2016-01-31 HISTORY — DX: Encounter for screening for malignant neoplasm of cervix: Z12.4

## 2016-01-31 NOTE — Assessment & Plan Note (Signed)
Pap today, no concerns on exam.  

## 2016-01-31 NOTE — Assessment & Plan Note (Signed)
Encouraged heart healthy diet, increase exercise, avoid trans fats, consider a krill oil cap daily 

## 2016-01-31 NOTE — Assessment & Plan Note (Signed)
loingstanding and stable, due to hematology disorder

## 2016-01-31 NOTE — Assessment & Plan Note (Signed)
Patient encouraged to maintain heart healthy diet, regular exercise, adequate sleep. Consider daily probiotics. Take medications as prescribed. Given and reviewed copy of ACP documents from [place] Secretary of State and encouraged to complete and return 

## 2016-01-31 NOTE — Patient Instructions (Addendum)
.bc Preventive Care for Adults, Female A healthy lifestyle and preventive care can promote health and wellness. Preventive health guidelines for women include the following key practices.  A routine yearly physical is a good way to check with your health care provider about your health and preventive screening. It is a chance to share any concerns and updates on your health and to receive a thorough exam.  Visit your dentist for a routine exam and preventive care every 6 months. Brush your teeth twice a day and floss once a day. Good oral hygiene prevents tooth decay and gum disease.  The frequency of eye exams is based on your age, health, family medical history, use of contact lenses, and other factors. Follow your health care provider's recommendations for frequency of eye exams.  Eat a healthy diet. Foods like vegetables, fruits, whole grains, low-fat dairy products, and lean protein foods contain the nutrients you need without too many calories. Decrease your intake of foods high in solid fats, added sugars, and salt. Eat the right amount of calories for you.Get information about a proper diet from your health care provider, if necessary.  Regular physical exercise is one of the most important things you can do for your health. Most adults should get at least 150 minutes of moderate-intensity exercise (any activity that increases your heart rate and causes you to sweat) each week. In addition, most adults need muscle-strengthening exercises on 2 or more days a week.  Maintain a healthy weight. The body mass index (BMI) is a screening tool to identify possible weight problems. It provides an estimate of body fat based on height and weight. Your health care provider can find your BMI and can help you achieve or maintain a healthy weight.For adults 20 years and older:  A BMI below 18.5 is considered underweight.  A BMI of 18.5 to 24.9 is normal.  A BMI of 25 to 29.9 is considered  overweight.  A BMI of 30 and above is considered obese.  Maintain normal blood lipids and cholesterol levels by exercising and minimizing your intake of saturated fat. Eat a balanced diet with plenty of fruit and vegetables. Blood tests for lipids and cholesterol should begin at age 68 and be repeated every 5 years. If your lipid or cholesterol levels are high, you are over 50, or you are at high risk for heart disease, you may need your cholesterol levels checked more frequently.Ongoing high lipid and cholesterol levels should be treated with medicines if diet and exercise are not working.  If you smoke, find out from your health care provider how to quit. If you do not use tobacco, do not start.  Lung cancer screening is recommended for adults aged 14-80 years who are at high risk for developing lung cancer because of a history of smoking. A yearly low-dose CT scan of the lungs is recommended for people who have at least a 30-pack-year history of smoking and are a current smoker or have quit within the past 15 years. A pack year of smoking is smoking an average of 1 pack of cigarettes a day for 1 year (for example: 1 pack a day for 30 years or 2 packs a day for 15 years). Yearly screening should continue until the smoker has stopped smoking for at least 15 years. Yearly screening should be stopped for people who develop a health problem that would prevent them from having lung cancer treatment.  If you are pregnant, do not drink alcohol. If you  are breastfeeding, be very cautious about drinking alcohol. If you are not pregnant and choose to drink alcohol, do not have more than 1 drink per day. One drink is considered to be 12 ounces (355 mL) of beer, 5 ounces (148 mL) of wine, or 1.5 ounces (44 mL) of liquor.  Avoid use of street drugs. Do not share needles with anyone. Ask for help if you need support or instructions about stopping the use of drugs.  High blood pressure causes heart disease and  increases the risk of stroke. Your blood pressure should be checked at least every 1 to 2 years. Ongoing high blood pressure should be treated with medicines if weight loss and exercise do not work.  If you are 12-58 years old, ask your health care provider if you should take aspirin to prevent strokes.  Diabetes screening is done by taking a blood sample to check your blood glucose level after you have not eaten for a certain period of time (fasting). If you are not overweight and you do not have risk factors for diabetes, you should be screened once every 3 years starting at age 76. If you are overweight or obese and you are 18-1 years of age, you should be screened for diabetes every year as part of your cardiovascular risk assessment.  Breast cancer screening is essential preventive care for women. You should practice "breast self-awareness." This means understanding the normal appearance and feel of your breasts and may include breast self-examination. Any changes detected, no matter how small, should be reported to a health care provider. Women in their 73s and 30s should have a clinical breast exam (CBE) by a health care provider as part of a regular health exam every 1 to 3 years. After age 36, women should have a CBE every year. Starting at age 50, women should consider having a mammogram (breast X-ray test) every year. Women who have a family history of breast cancer should talk to their health care provider about genetic screening. Women at a high risk of breast cancer should talk to their health care providers about having an MRI and a mammogram every year.  Breast cancer gene (BRCA)-related cancer risk assessment is recommended for women who have family members with BRCA-related cancers. BRCA-related cancers include breast, ovarian, tubal, and peritoneal cancers. Having family members with these cancers may be associated with an increased risk for harmful changes (mutations) in the breast  cancer genes BRCA1 and BRCA2. Results of the assessment will determine the need for genetic counseling and BRCA1 and BRCA2 testing.  Your health care provider may recommend that you be screened regularly for cancer of the pelvic organs (ovaries, uterus, and vagina). This screening involves a pelvic examination, including checking for microscopic changes to the surface of your cervix (Pap test). You may be encouraged to have this screening done every 3 years, beginning at age 40.  For women ages 32-65, health care providers may recommend pelvic exams and Pap testing every 3 years, or they may recommend the Pap and pelvic exam, combined with testing for human papilloma virus (HPV), every 5 years. Some types of HPV increase your risk of cervical cancer. Testing for HPV may also be done on women of any age with unclear Pap test results.  Other health care providers may not recommend any screening for nonpregnant women who are considered low risk for pelvic cancer and who do not have symptoms. Ask your health care provider if a screening pelvic exam is right  you.  If you have had past treatment for cervical cancer or a condition that could lead to cancer, you need Pap tests and screening for cancer for at least 20 years after your treatment. If Pap tests have been discontinued, your risk factors (such as having a new sexual partner) need to be reassessed to determine if screening should resume. Some women have medical problems that increase the chance of getting cervical cancer. In these cases, your health care provider may recommend more frequent screening and Pap tests.  Colorectal cancer can be detected and often prevented. Most routine colorectal cancer screening begins at the age of 50 years and continues through age 75 years. However, your health care provider may recommend screening at an earlier age if you have risk factors for colon cancer. On a yearly basis, your health care provider may provide home test kits to check  for hidden blood in the stool. Use of a small camera at the end of a tube, to directly examine the colon (sigmoidoscopy or colonoscopy), can detect the earliest forms of colorectal cancer. Talk to your health care provider about this at age 50, when routine screening begins. Direct exam of the colon should be repeated every 5-10 years through age 75 years, unless early forms of precancerous polyps or small growths are found.  People who are at an increased risk for hepatitis B should be screened for this virus. You are considered at high risk for hepatitis B if:  You were born in a country where hepatitis B occurs often. Talk with your health care provider about which countries are considered high risk.  Your parents were born in a high-risk country and you have not received a shot to protect against hepatitis B (hepatitis B vaccine).  You have HIV or AIDS.  You use needles to inject street drugs.  You live with, or have sex with, someone who has hepatitis B.  You get hemodialysis treatment.  You take certain medicines for conditions like cancer, organ transplantation, and autoimmune conditions.  Hepatitis C blood testing is recommended for all people born from 1945 through 1965 and any individual with known risks for hepatitis C.  Practice safe sex. Use condoms and avoid high-risk sexual practices to reduce the spread of sexually transmitted infections (STIs). STIs include gonorrhea, chlamydia, syphilis, trichomonas, herpes, HPV, and human immunodeficiency virus (HIV). Herpes, HIV, and HPV are viral illnesses that have no cure. They can result in disability, cancer, and death.  You should be screened for sexually transmitted illnesses (STIs) including gonorrhea and chlamydia if:  You are sexually active and are younger than 24 years.  You are older than 24 years and your health care provider tells you that you are at risk for this type of infection.  Your sexual activity has changed  since you were last screened and you are at an increased risk for chlamydia or gonorrhea. Ask your health care provider if you are at risk.  If you are at risk of being infected with HIV, it is recommended that you take a prescription medicine daily to prevent HIV infection. This is called preexposure prophylaxis (PrEP). You are considered at risk if:  You are sexually active and do not regularly use condoms or know the HIV status of your partner(s).  You take drugs by injection.  You are sexually active with a partner who has HIV.  Talk with your health care provider about whether you are at high risk of being infected with HIV. If   you choose to begin PrEP, you should first be tested for HIV. You should then be tested every 3 months for as long as you are taking PrEP.  Osteoporosis is a disease in which the bones lose minerals and strength with aging. This can result in serious bone fractures or breaks. The risk of osteoporosis can be identified using a bone density scan. Women ages 65 years and over and women at risk for fractures or osteoporosis should discuss screening with their health care providers. Ask your health care provider whether you should take a calcium supplement or vitamin D to reduce the rate of osteoporosis.  Menopause can be associated with physical symptoms and risks. Hormone replacement therapy is available to decrease symptoms and risks. You should talk to your health care provider about whether hormone replacement therapy is right for you.  Use sunscreen. Apply sunscreen liberally and repeatedly throughout the day. You should seek shade when your shadow is shorter than you. Protect yourself by wearing long sleeves, pants, a wide-brimmed hat, and sunglasses year round, whenever you are outdoors.  Once a month, do a whole body skin exam, using a mirror to look at the skin on your back. Tell your health care provider of new moles, moles that have irregular borders, moles that  are larger than a pencil eraser, or moles that have changed in shape or color.  Stay current with required vaccines (immunizations).  Influenza vaccine. All adults should be immunized every year.  Tetanus, diphtheria, and acellular pertussis (Td, Tdap) vaccine. Pregnant women should receive 1 dose of Tdap vaccine during each pregnancy. The dose should be obtained regardless of the length of time since the last dose. Immunization is preferred during the 27th-36th week of gestation. An adult who has not previously received Tdap or who does not know her vaccine status should receive 1 dose of Tdap. This initial dose should be followed by tetanus and diphtheria toxoids (Td) booster doses every 10 years. Adults with an unknown or incomplete history of completing a 3-dose immunization series with Td-containing vaccines should begin or complete a primary immunization series including a Tdap dose. Adults should receive a Td booster every 10 years.  Varicella vaccine. An adult without evidence of immunity to varicella should receive 2 doses or a second dose if she has previously received 1 dose. Pregnant females who do not have evidence of immunity should receive the first dose after pregnancy. This first dose should be obtained before leaving the health care facility. The second dose should be obtained 4-8 weeks after the first dose.  Human papillomavirus (HPV) vaccine. Females aged 13-26 years who have not received the vaccine previously should obtain the 3-dose series. The vaccine is not recommended for use in pregnant females. However, pregnancy testing is not needed before receiving a dose. If a female is found to be pregnant after receiving a dose, no treatment is needed. In that case, the remaining doses should be delayed until after the pregnancy. Immunization is recommended for any person with an immunocompromised condition through the age of 26 years if she did not get any or all doses earlier. During the  3-dose series, the second dose should be obtained 4-8 weeks after the first dose. The third dose should be obtained 24 weeks after the first dose and 16 weeks after the second dose.  Zoster vaccine. One dose is recommended for adults aged 60 years or older unless certain conditions are present.  Measles, mumps, and rubella (MMR) vaccine. Adults born   born before [birth year] generally are considered immune to measles and mumps. Adults born in [birth year] or later should have 1 or more doses of MMR vaccine unless there is a contraindication to the vaccine or there is laboratory evidence of immunity to each of the three diseases. A routine second dose of MMR vaccine should be obtained at least 28 days after the first dose for students attending postsecondary schools, health care workers, or international travelers. People who received inactivated measles vaccine or an unknown type of measles vaccine during 1963-1967 should receive 2 doses of MMR vaccine. People who received inactivated mumps vaccine or an unknown type of mumps vaccine before 1979 and are at high risk for mumps infection should consider immunization with 2 doses of MMR vaccine. For females of childbearing age, rubella immunity should be determined. If there is no evidence of immunity, females who are not pregnant should be vaccinated. If there is no evidence of immunity, females who are pregnant should delay immunization until after pregnancy. Unvaccinated health care workers born before [birth year] who lack laboratory evidence of measles, mumps, or rubella immunity or laboratory confirmation of disease should consider measles and mumps immunization with 2 doses of MMR vaccine or rubella immunization with 1 dose of MMR vaccine.  Pneumococcal 13-valent conjugate (PCV13) vaccine. When indicated, a person who is uncertain of his immunization history and has no record of immunization should receive the PCV13 vaccine. All adults 62 years of age and older  should receive this vaccine. An adult aged 39 years or older who has certain medical conditions and has not been previously immunized should receive 1 dose of PCV13 vaccine. This PCV13 should be followed with a dose of pneumococcal polysaccharide (PPSV23) vaccine. Adults who are at high risk for pneumococcal disease should obtain the PPSV23 vaccine at least 8 weeks after the dose of PCV13 vaccine. Adults older than 65 years of age who have normal immune system function should obtain the PPSV23 vaccine dose at least 1 year after the dose of PCV13 vaccine.  Pneumococcal polysaccharide (PPSV23) vaccine. When PCV13 is also indicated, PCV13 should be obtained first. All adults aged 47 years and older should be immunized. An adult younger than age 27 years who has certain medical conditions should be immunized. Any person who resides in a nursing home or long-term care facility should be immunized. An adult smoker should be immunized. People with an immunocompromised condition and certain other conditions should receive both PCV13 and PPSV23 vaccines. People with human immunodeficiency virus (HIV) infection should be immunized as soon as possible after diagnosis. Immunization during chemotherapy or radiation therapy should be avoided. Routine use of PPSV23 vaccine is not recommended for American Indians, [place] Natives, or people younger than 65 years unless there are medical conditions that require PPSV23 vaccine. When indicated, people who have unknown immunization and have no record of immunization should receive PPSV23 vaccine. One-time revaccination 5 years after the first dose of PPSV23 is recommended for people aged 19-64 years who have chronic kidney failure, nephrotic syndrome, asplenia, or immunocompromised conditions. People who received 1-2 doses of PPSV23 before age 57 years should receive another dose of PPSV23 vaccine at age 42 years or later if at least 5 years have passed since the previous dose. Doses  of PPSV23 are not needed for people immunized with PPSV23 at or after age 51 years.  Meningococcal vaccine. Adults with asplenia or persistent complement component deficiencies should receive 2 doses of quadrivalent meningococcal conjugate (MenACWY-D) vaccine. The doses should be  at least 2 months apart. Microbiologists working with certain meningococcal bacteria, military recruits, people at risk during an outbreak, and people who travel to or live in countries with a high rate of meningitis should be immunized. A first-year college student up through age 21 years who is living in a residence hall should receive a dose if she did not receive a dose on or after her 16th birthday. Adults who have certain high-risk conditions should receive one or more doses of vaccine.  Hepatitis A vaccine. Adults who wish to be protected from this disease, have certain high-risk conditions, work with hepatitis A-infected animals, work in hepatitis A research labs, or travel to or work in countries with a high rate of hepatitis A should be immunized. Adults who were previously unvaccinated and who anticipate close contact with an international adoptee during the first 60 days after arrival in the [place] from a country with a high rate of hepatitis A should be immunized.  Hepatitis B vaccine. Adults who wish to be protected from this disease, have certain high-risk conditions, may be exposed to blood or other infectious body fluids, are household contacts or sex partners of hepatitis B positive people, are clients or workers in certain care facilities, or travel to or work in countries with a high rate of hepatitis B should be immunized.  Haemophilus influenzae type b (Hib) vaccine. A previously unvaccinated person with asplenia or sickle cell disease or having a scheduled splenectomy should receive 1 dose of Hib vaccine. Regardless of previous immunization, a recipient of a hematopoietic stem cell transplant should receive a  3-dose series 6-12 months after her successful transplant. Hib vaccine is not recommended for adults with HIV infection. Preventive Services / Frequency Ages 19 to 39 years  Blood pressure check.** / Every 3-5 years.  Lipid and cholesterol check.** / Every 5 years beginning at age 20.  Clinical breast exam.** / Every 3 years for women in their 20s and 30s.  BRCA-related cancer risk assessment.** / For women who have family members with a BRCA-related cancer (breast, ovarian, tubal, or peritoneal cancers).  Pap test.** / Every 2 years from ages 21 through 29. Every 3 years starting at age 30 through age 65 or 70 with a history of 3 consecutive normal Pap tests.  HPV screening.** / Every 3 years from ages 30 through ages 65 to 70 with a history of 3 consecutive normal Pap tests.  Hepatitis C blood test.** / For any individual with known risks for hepatitis C.  Skin self-exam. / Monthly.  Influenza vaccine. / Every year.  Tetanus, diphtheria, and acellular pertussis (Tdap, Td) vaccine.** / Consult your health care provider. Pregnant women should receive 1 dose of Tdap vaccine during each pregnancy. 1 dose of Td every 10 years.  Varicella vaccine.** / Consult your health care provider. Pregnant females who do not have evidence of immunity should receive the first dose after pregnancy.  HPV vaccine. / 3 doses over 6 months, if 26 and younger. The vaccine is not recommended for use in pregnant females. However, pregnancy testing is not needed before receiving a dose.  Measles, mumps, rubella (MMR) vaccine.** / You need at least 1 dose of MMR if you were born in 1957 or later. You may also need a 2nd dose. For females of childbearing age, rubella immunity should be determined. If there is no evidence of immunity, females who are not pregnant should be vaccinated. If there is no evidence of immunity, females who are   pregnant should delay immunization until after pregnancy.  Pneumococcal  13-valent conjugate (PCV13) vaccine.** / Consult your health care provider.  Pneumococcal polysaccharide (PPSV23) vaccine.** / 1 to 2 doses if you smoke cigarettes or if you have certain conditions.  Meningococcal vaccine.** / 1 dose if you are age 19 to 21 years and a first-year college student living in a residence hall, or have one of several medical conditions, you need to get vaccinated against meningococcal disease. You may also need additional booster doses.  Hepatitis A vaccine.** / Consult your health care provider.  Hepatitis B vaccine.** / Consult your health care provider.  Haemophilus influenzae type b (Hib) vaccine.** / Consult your health care provider. Ages 40 to 64 years  Blood pressure check.** / Every year.  Lipid and cholesterol check.** / Every 5 years beginning at age 20 years.  Lung cancer screening. / Every year if you are aged 55-80 years and have a 30-pack-year history of smoking and currently smoke or have quit within the past 15 years. Yearly screening is stopped once you have quit smoking for at least 15 years or develop a health problem that would prevent you from having lung cancer treatment.  Clinical breast exam.** / Every year after age 40 years.  BRCA-related cancer risk assessment.** / For women who have family members with a BRCA-related cancer (breast, ovarian, tubal, or peritoneal cancers).  Mammogram.** / Every year beginning at age 40 years and continuing for as long as you are in good health. Consult with your health care provider.  Pap test.** / Every 3 years starting at age 30 years through age 65 or 70 years with a history of 3 consecutive normal Pap tests.  HPV screening.** / Every 3 years from ages 30 years through ages 65 to 70 years with a history of 3 consecutive normal Pap tests.  Fecal occult blood test (FOBT) of stool. / Every year beginning at age 50 years and continuing until age 75 years. You may not need to do this test if you get  a colonoscopy every 10 years.  Flexible sigmoidoscopy or colonoscopy.** / Every 5 years for a flexible sigmoidoscopy or every 10 years for a colonoscopy beginning at age 50 years and continuing until age 75 years.  Hepatitis C blood test.** / For all people born from 1945 through 1965 and any individual with known risks for hepatitis C.  Skin self-exam. / Monthly.  Influenza vaccine. / Every year.  Tetanus, diphtheria, and acellular pertussis (Tdap/Td) vaccine.** / Consult your health care provider. Pregnant women should receive 1 dose of Tdap vaccine during each pregnancy. 1 dose of Td every 10 years.  Varicella vaccine.** / Consult your health care provider. Pregnant females who do not have evidence of immunity should receive the first dose after pregnancy.  Zoster vaccine.** / 1 dose for adults aged 60 years or older.  Measles, mumps, rubella (MMR) vaccine.** / You need at least 1 dose of MMR if you were born in 1957 or later. You may also need a second dose. For females of childbearing age, rubella immunity should be determined. If there is no evidence of immunity, females who are not pregnant should be vaccinated. If there is no evidence of immunity, females who are pregnant should delay immunization until after pregnancy.  Pneumococcal 13-valent conjugate (PCV13) vaccine.** / Consult your health care provider.  Pneumococcal polysaccharide (PPSV23) vaccine.** / 1 to 2 doses if you smoke cigarettes or if you have certain conditions.  Meningococcal vaccine.** /   Consult your health care provider.  Hepatitis A vaccine.** / Consult your health care provider.  Hepatitis B vaccine.** / Consult your health care provider.  Haemophilus influenzae type b (Hib) vaccine.** / Consult your health care provider. Ages 80 years and over  Blood pressure check.** / Every year.  Lipid and cholesterol check.** / Every 5 years beginning at age 62 years.  Lung cancer  screening. / Every year if you are aged 32-80 years and have a 30-pack-year history of smoking and currently smoke or have quit within the past 15 years. Yearly screening is stopped once you have quit smoking for at least 15 years or develop a health problem that would prevent you from having lung cancer treatment.  Clinical breast exam.** / Every year after age 61 years.  BRCA-related cancer risk assessment.** / For women who have family members with a BRCA-related cancer (breast, ovarian, tubal, or peritoneal cancers).  Mammogram.** / Every year beginning at age 39 years and continuing for as long as you are in good health. Consult with your health care provider.  Pap test.** / Every 3 years starting at age 85 years through age 74 or 72 years with 3 consecutive normal Pap tests. Testing can be stopped between 65 and 70 years with 3 consecutive normal Pap tests and no abnormal Pap or HPV tests in the past 10 years.  HPV screening.** / Every 3 years from ages 55 years through ages 67 or 77 years with a history of 3 consecutive normal Pap tests. Testing can be stopped between 65 and 70 years with 3 consecutive normal Pap tests and no abnormal Pap or HPV tests in the past 10 years.  Fecal occult blood test (FOBT) of stool. / Every year beginning at age 81 years and continuing until age 22 years. You may not need to do this test if you get a colonoscopy every 10 years.  Flexible sigmoidoscopy or colonoscopy.** / Every 5 years for a flexible sigmoidoscopy or every 10 years for a colonoscopy beginning at age 67 years and continuing until age 22 years.  Hepatitis C blood test.** / For all people born from [birth year] through 1965 and any individual with known risks for hepatitis C.  Osteoporosis screening.** / A one-time screening for women ages 8 years and over and women at risk for fractures or osteoporosis.  Skin self-exam. / Monthly.  Influenza vaccine. / Every year.  Tetanus, diphtheria, and  acellular pertussis (Tdap/Td) vaccine.** / 1 dose of Td every 10 years.  Varicella vaccine.** / Consult your health care provider.  Zoster vaccine.** / 1 dose for adults aged 56 years or older.  Pneumococcal 13-valent conjugate (PCV13) vaccine.** / Consult your health care provider.  Pneumococcal polysaccharide (PPSV23) vaccine.** / 1 dose for all adults aged 15 years and older.  Meningococcal vaccine.** / Consult your health care provider.  Hepatitis A vaccine.** / Consult your health care provider.  Hepatitis B vaccine.** / Consult your health care provider.  Haemophilus influenzae type b (Hib) vaccine.** / Consult your health care provider. ** Family history and personal history of risk and conditions may change your health care provider's recommendations.   This information is not intended to replace advice given to you by your health care provider. Make sure you discuss any questions you have with your health care provider.   Document Released: 10/28/2001 Document Revised: 09/22/2014 Document Reviewed: 01/27/2011 Elsevier Interactive Patient Education [employer].

## 2016-01-31 NOTE — Progress Notes (Signed)
Subjective:    Patient ID: Brenda Lara, female    DOB: April 16, 1951, 65 y.o.   MRN: [id number]  Chief Complaint  Patient presents with  . Follow-up    HPI Patient is in today for follow up.  Patient presents today doing well lab work does show some anemia.  Denies CP/palp/SOB/HA/congestion/fevers/GI or GU c/o. Taking meds as prescribed. No new concerns. No recent hospitalization or illness. Is doing well with ADLs. No gyn concerns.  Past Medical History  Diagnosis Date  . HTN (hypertension)   . GERD (gastroesophageal reflux disease)   . Pure hypercholesterolemia   . Angina pectoris   . Menopause   . Anemia   . Anxiety   . Coronary artery disease 2001/2005    S/P stenting to RCA and LAD  . Colon cancer ([place]) 2010  . History of chicken pox   . Hearing loss 08/05/2015  . Cervical cancer screening 01/31/2016    Menarche at 12 Regular and moderate flow No history of abnormal pap in past G1P1, s/p 1 SVD No history of abnormal MGM No concerns today gyn surgeries b/l tubal ligation and b/y oophorectomy  . Preventative health care 01/31/2016    Past Surgical History  Procedure Laterality Date  . Coronary stent placement  2001/2005    RCA  . Back surgery  1978, 1988    multiple  . Cystectomy      left ovary  . Colon surgery  2010    right colectomy  . Bilateral oophorectomy  2010  . Appendectomy  02/21/2009  . Cholecystectomy  11/09  . Colonoscopy  01/26/2014    NORMAL     Family History  Problem Relation Age of Onset  . Coronary artery disease    . Hypertension Mother   . Transient ischemic attack Mother   . Heart attack Mother     age 84  . Heart disease Mother   . Heart attack Father     68's  . Cancer Father     prostate  . Heart disease Father   . Alcohol abuse Father   . Arthritis Father     mva  . Colon cancer Neg Hx   . Pancreatic cancer Neg Hx   . Rectal cancer Neg Hx   . Stomach cancer Neg Hx   . Thyroid disease Daughter     Social History    Social History  . Marital Status: Married    Spouse Name: N/A  . Number of Children: N/A  . Years of Education: N/A   Occupational History  . Not on file.   Social History Main Topics  . Smoking status: Never Smoker   . Smokeless tobacco: Never Used  . Alcohol Use: No  . Drug Use: No  . 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 Not on file   Social History Narrative    Outpatient Prescriptions Prior to Visit  Medication Sig Dispense Refill  . aspirin 325 MG tablet Take 325 mg by mouth daily.      Marland Kitchen atenolol (TENORMIN) 25 MG tablet Take 1 tablet (25 mg total) by mouth 3 (three) times daily. 90 tablet 1  . baclofen (LIORESAL) 10 MG tablet 10 mg as needed.     . Ferrous Fumarate-Folic Acid (HEMOCYTE-F) 324-1 MG TABS Take 1 tablet by mouth daily. 30 each 3  .  meclizine (ANTIVERT) 25 MG tablet Take 1 tablet (25 mg total) by mouth 4 (four) times daily as needed. 30 tablet 1  . nitroGLYCERIN (NITROSTAT) 0.4 MG SL tablet Place 1 tablet (0.4 mg total) under the tongue every 5 (five) minutes as needed for chest pain. 25 tablet 4  . NON FORMULARY IDEAS supplements Take 2 in the morning and 2 in the evening.    . simvastatin (ZOCOR) 40 MG tablet TAKE 1 TABLET (40 MG TOTAL) BY MOUTH EVERY EVENING. 30 tablet 1  . topiramate (TOPAMAX) 100 MG tablet 100 mg daily.     . vitamin C (ASCORBIC ACID) 500 MG tablet Take 500 mg by mouth daily.    Marland Kitchen omeprazole (PRILOSEC) 20 MG capsule Take 1 capsule (20 mg total) by mouth daily. 90 capsule 1  . fluticasone (FLONASE) 50 MCG/ACT nasal spray Place 2 sprays into both nostrils daily. 16 g 6   No facility-administered medications prior to visit.    No Known Allergies  Review of Systems  Constitutional: Negative for fever and malaise/fatigue.  HENT: Negative for congestion.   Eyes: Negative for blurred vision.   Respiratory: Negative for shortness of breath.   Cardiovascular: Negative for chest pain, palpitations and leg swelling.  Gastrointestinal: Negative for nausea, abdominal pain and blood in stool.  Genitourinary: Negative for dysuria and frequency.  Musculoskeletal: Negative for falls.  Skin: Negative for rash.  Neurological: Negative for dizziness, loss of consciousness and headaches.  Endo/Heme/Allergies: Negative for environmental allergies.  Psychiatric/Behavioral: Negative for depression. The patient is not nervous/anxious.        Objective:    Physical Exam  Constitutional: She is oriented to person, place, and time. She appears well-developed and well-nourished. No distress.  HENT:  Head: Normocephalic and atraumatic.  Eyes: Conjunctivae are normal.  Neck: Neck supple. No thyromegaly present.  Cardiovascular: Normal rate, regular rhythm and normal heart sounds.   No murmur heard. Pulmonary/Chest: Effort normal and breath sounds normal. No respiratory distress.  Abdominal: Soft. Bowel sounds are normal. She exhibits no distension and no mass. There is no tenderness.  Genitourinary: Vagina normal and uterus normal. No vaginal discharge found.  Breast exam unremarkable, no lesions, skin lesions or discharge  Musculoskeletal: She exhibits no edema.  Lymphadenopathy:    She has no cervical adenopathy.  Neurological: She is alert and oriented to person, place, and time.  Skin: Skin is warm and dry.  Psychiatric: She has a normal mood and affect. Her behavior is normal.    BP 122/76 mmHg  Pulse 71  Temp(Src) 98.4 F (36.9 C) (Oral)  Ht 5\' 1"  (1.549 m)  Wt 142 lb (64.411 kg)  BMI 26.84 kg/m2  SpO2 99% Wt Readings from Last 3 Encounters:  01/31/16 142 lb (64.411 kg)  07/27/15 143 lb 8 oz (65.091 kg)  01/24/15 140 lb 12.8 oz (63.866 kg)     Lab Results  Component Value Date   WBC 5.9 01/24/2016   HGB 10.4* 01/24/2016   HCT 32.9* 01/24/2016   PLT 293.0 01/24/2016    GLUCOSE 103* 01/24/2016   CHOL 141 01/24/2016   TRIG 116.0 01/24/2016   HDL 53.50 01/24/2016   LDLCALC 64 01/24/2016   ALT 16 01/24/2016   AST 21 01/24/2016   NA 140 01/24/2016   K 3.7 01/24/2016   CL 107 01/24/2016   CREATININE 0.79 01/24/2016   BUN 13 01/24/2016   CO2 24 01/24/2016   TSH 2.56 01/24/2016   INR 0.95 05/13/2011    Lab  Results  Component Value Date   TSH 2.56 01/24/2016   Lab Results  Component Value Date   WBC 5.9 01/24/2016   HGB 10.4* 01/24/2016   HCT 32.9* 01/24/2016   MCV 75.3* 01/24/2016   PLT 293.0 01/24/2016   Lab Results  Component Value Date   NA 140 01/24/2016   K 3.7 01/24/2016   CO2 24 01/24/2016   GLUCOSE 103* 01/24/2016   BUN 13 01/24/2016   CREATININE 0.79 01/24/2016   BILITOT 0.3 01/24/2016   ALKPHOS 73 01/24/2016   AST 21 01/24/2016   ALT 16 01/24/2016   PROT 6.8 01/24/2016   ALBUMIN 4.1 01/24/2016   CALCIUM 9.4 01/24/2016   GFR 77.77 01/24/2016   Lab Results  Component Value Date   CHOL 141 01/24/2016   Lab Results  Component Value Date   HDL 53.50 01/24/2016   Lab Results  Component Value Date   LDLCALC 64 01/24/2016   Lab Results  Component Value Date   TRIG 116.0 01/24/2016   Lab Results  Component Value Date   CHOLHDL 3 01/24/2016   No results found for: HGBA1C     Assessment & Plan:   Problem List Items Addressed This Visit    Preventative health care    Patient encouraged to maintain heart healthy diet, regular exercise, adequate sleep. Consider daily probiotics. Take medications as prescribed. Given and reviewed copy of ACP documents from [place] and encouraged to complete and return      Relevant Orders   Cytology - PAP   Vitamin D (25 hydroxy) (Completed)   HYPERCHOLESTEROLEMIA    Encouraged heart healthy diet, increase exercise, avoid trans fats, consider a krill oil cap daily      GERD    Avoid offending foods, start probiotics. Do not eat large meals in late evening and  consider raising head of bed.       Essential hypertension    Well controlled, no changes to meds. Encouraged heart healthy diet such as the DASH diet and exercise as tolerated.       Cervical cancer screening    Pap today, no concerns on exam.       Anemia    loingstanding and stable, due to hematology disorder       Other Visit Diagnoses    Pap smear for cervical cancer screening    -  Primary    Relevant Orders    Cytology - PAP    Osteoporosis        Relevant Orders    Vitamin D (25 hydroxy) (Completed)       I have discontinued Ms. Kerstetter's omeprazole and fluticasone. I am also having her maintain her aspirin, nitroGLYCERIN, baclofen, topiramate, vitamin C, NON FORMULARY, meclizine, simvastatin, atenolol, and Ferrous Fumarate-Folic Acid.  No orders of the defined types were placed in this encounter.     Penni Homans, MD

## 2016-01-31 NOTE — Progress Notes (Signed)
Pre visit review using our clinic review tool, if applicable. No additional management support is needed unless otherwise documented below in the visit note. 

## 2016-02-01 ENCOUNTER — Other Ambulatory Visit: Payer: Self-pay | Admitting: Family Medicine

## 2016-02-01 LAB — VITAMIN D 25 HYDROXY (VIT D DEFICIENCY, FRACTURES): VITD: 15.18 ng/mL — ABNORMAL LOW (ref 30.00–100.00)

## 2016-02-01 MED ORDER — VITAMIN D (ERGOCALCIFEROL) 1.25 MG (50000 UNIT) PO CAPS: 50000.0000 [IU] | ORAL_CAPSULE | ORAL | Status: DC

## 2016-02-02 NOTE — Progress Notes (Signed)
Patient ID: Brenda Lara, female   DOB: 08/01/1951, 65 y.o.   MRN: [id number]   This is a  65 y.o. white female patient with history of coronary artery disease, status post stenting of the RCA in 2006 and LAD in 2005. Repeat cardiac catheterization, May 31, 2009.  showed the patient's stents were widely patent and she had no other significant disease. Her ejection fraction was 55% with mild apical hypokinesis. No chest pain primary following cholesterol LDL under 100 in February  Non angina compliant with meds   S/P right hemicolectomy for colon cancer now in remission.  F/U colonoscopy June 2015 by Dr Henrene Pastor normal  No chest pains   Back in 2006 had atypical bilateral arm pains. Had passed an ETT but cath done when no other etiology of arm pain found She then has a positive nuclear study that lead to her 2nd intervention   ROS: Denies fever, malais, weight loss, blurry vision, decreased visual acuity, cough, sputum, SOB, hemoptysis, pleuritic pain, palpitaitons, heartburn, abdominal pain, melena, lower extremity edema, claudication, or rash.  All other systems reviewed and negative  General: Affect appropriate Healthy:  appears stated age 65: normal Neck supple with no adenopathy JVP normal no bruits no thyromegaly Lungs clear with no wheezing and good diaphragmatic motion Heart:  S1/S2 no murmur, no rub, gallop or click PMI normal Abdomen: benighn, BS positve, no tenderness, no AAA no bruit.  No HSM or HJR Distal pulses intact with no bruits No edema Neuro non-focal Skin warm and dry No muscular weakness   Normal ETT 02/22/15   Current Outpatient Prescriptions  Medication Sig Dispense Refill  . aspirin 325 MG tablet Take 325 mg by mouth daily.      Marland Kitchen atenolol (TENORMIN) 25 MG tablet Take 1 tablet (25 mg total) by mouth 3 (three) times daily. 90 tablet 1  . baclofen (LIORESAL) 10 MG tablet Take 10 mg by mouth daily as needed for muscle spasms.     . Ferrous  Fumarate-Folic Acid (HEMOCYTE-F) 324-1 MG TABS Take 1 tablet by mouth daily. 30 each 3  . meclizine (ANTIVERT) 25 MG tablet Take 25 mg by mouth 3 (three) times daily as needed for dizziness.    . nitroGLYCERIN (NITROSTAT) 0.4 MG SL tablet Place 1 tablet (0.4 mg total) under the tongue every 5 (five) minutes as needed for chest pain. 25 tablet 4  . OVER THE COUNTER MEDICATION Ideas supplement Take 2 tablets by mouth in the am & 2 tablets by mouth in the pm    . simvastatin (ZOCOR) 40 MG tablet TAKE 1 TABLET (40 MG TOTAL) BY MOUTH EVERY EVENING. 30 tablet 1  . topiramate (TOPAMAX) 100 MG tablet Take 100 mg by mouth daily.     . vitamin C (ASCORBIC ACID) 500 MG tablet Take 500 mg by mouth daily.    . Vitamin D, Ergocalciferol, (DRISDOL) 50000 units CAPS capsule Take 1 capsule (50,000 Units total) by mouth every 7 (seven) days. 4 capsule 4   No current facility-administered medications for this visit.    Allergies  Review of patient's allergies indicates no known allergies.  Electrocardiogram:  01/20/14  NSR rate 67 normal ECG  01/24/15 SR rate 57  Normal ECG   02/05/16  SR rate 59 normal   Assessment and Plan CAD:  Distant history of RCA/LAD stenting  Patent by cath in 2010.  No arm/chest pain normal ECG continue ASA and beta blocker New SL nitro called in   Chol:  On statin  Cholesterol is at goal.  Continue current dose of statin and diet Rx.  No myalgias or side effects.  F/U  LFT's in 6 months. Lab Results  Component Value Date   LDLCALC 64 01/24/2016    Colon Cancer:  Colonoscopy normal last 2015  in remission fu GI   Jenkins Rouge

## 2016-02-03 NOTE — Assessment & Plan Note (Signed)
Well controlled, no changes to meds. Encouraged heart healthy diet such as the DASH diet and exercise as tolerated.  °

## 2016-02-03 NOTE — Assessment & Plan Note (Signed)
Avoid offending foods, start probiotics. Do not eat large meals in late evening and consider raising head of bed.  

## 2016-02-04 LAB — CYTOLOGY - PAP

## 2016-02-05 ENCOUNTER — Ambulatory Visit (INDEPENDENT_AMBULATORY_CARE_PROVIDER_SITE_OTHER): Payer: 59 | Admitting: Cardiovascular Disease

## 2016-02-05 ENCOUNTER — Encounter: Payer: Self-pay | Admitting: Cardiovascular Disease

## 2016-02-05 VITALS — BP 150/70 | HR 60 | Ht 61.0 in | Wt 142.0 lb

## 2016-02-05 DIAGNOSIS — E7800 Pure hypercholesterolemia, unspecified: Secondary | ICD-10-CM

## 2016-02-05 DIAGNOSIS — I2510 Atherosclerotic heart disease of native coronary artery without angina pectoris: Secondary | ICD-10-CM

## 2016-02-05 MED ORDER — NITROGLYCERIN 0.4 MG SL SUBL: 0.4000 mg | SUBLINGUAL_TABLET | SUBLINGUAL | Status: DC | PRN

## 2016-02-05 NOTE — Patient Instructions (Signed)

## 2016-02-23 ENCOUNTER — Other Ambulatory Visit: Payer: Self-pay | Admitting: Family Medicine

## 2016-02-23 ENCOUNTER — Other Ambulatory Visit: Payer: Self-pay | Admitting: Cardiovascular Disease

## 2016-05-28 ENCOUNTER — Other Ambulatory Visit: Payer: Self-pay | Admitting: Family Medicine

## 2016-06-12 ENCOUNTER — Other Ambulatory Visit: Payer: Self-pay | Admitting: Family Medicine

## 2016-06-12 MED ORDER — VITAMIN D (ERGOCALCIFEROL) 1.25 MG (50000 UNIT) PO CAPS: 50000.0000 [IU] | ORAL_CAPSULE | ORAL | 0 refills | Status: DC

## 2016-06-19 ENCOUNTER — Other Ambulatory Visit: Payer: Self-pay | Admitting: Family Medicine

## 2016-07-09 ENCOUNTER — Other Ambulatory Visit: Payer: Self-pay | Admitting: Family Medicine

## 2016-07-11 ENCOUNTER — Other Ambulatory Visit: Payer: Self-pay | Admitting: Family Medicine

## 2016-07-25 ENCOUNTER — Other Ambulatory Visit: Payer: Self-pay | Admitting: Family Medicine

## 2016-07-29 ENCOUNTER — Other Ambulatory Visit: Payer: Self-pay | Admitting: Family Medicine

## 2016-07-30 NOTE — Telephone Encounter (Signed)
Rx phoned to [facility]; per pharmacy, 07/25/16 Rx sent electronically was not received/SLS 11/15

## 2016-09-14 ENCOUNTER — Other Ambulatory Visit: Payer: Self-pay | Admitting: Family Medicine

## 2016-09-21 ENCOUNTER — Other Ambulatory Visit: Payer: Self-pay | Admitting: Family Medicine

## 2016-10-20 ENCOUNTER — Other Ambulatory Visit: Payer: Self-pay | Admitting: Family Medicine

## 2016-12-03 ENCOUNTER — Other Ambulatory Visit: Payer: Self-pay | Admitting: Family Medicine

## 2016-12-19 ENCOUNTER — Other Ambulatory Visit: Payer: Self-pay | Admitting: Family Medicine

## 2016-12-19 DIAGNOSIS — Z1231 Encounter for screening mammogram for malignant neoplasm of breast: Secondary | ICD-10-CM

## 2016-12-31 ENCOUNTER — Other Ambulatory Visit: Payer: Self-pay | Admitting: Family Medicine

## 2017-01-24 ENCOUNTER — Other Ambulatory Visit: Payer: Self-pay | Admitting: Family Medicine

## 2017-01-28 ENCOUNTER — Telehealth: Payer: Self-pay | Admitting: Family Medicine

## 2017-01-28 NOTE — Telephone Encounter (Signed)
Relation to pt: self Call back number:[phone]  Reason for call:  Patient sent mychart message requesting pneumonia orders, please advise if nurse visit only?  As per patient: I will be there for a mammogram on 5/17 at 8:20 if I could possibly get my pneumonia shot after I am finished there that would work out great, if not we can schedule an appointment some other time. I am on vacation this week.   Thank you

## 2017-01-29 ENCOUNTER — Ambulatory Visit (HOSPITAL_BASED_OUTPATIENT_CLINIC_OR_DEPARTMENT_OTHER)
Admission: RE | Admit: 2017-01-29 | Discharge: 2017-01-29 | Disposition: A | Discharge status: Home / Self Care | Payer: BLUE CROSS/BLUE SHIELD | Source: Ambulatory Visit | Attending: Family Medicine | Admitting: Family Medicine

## 2017-01-29 DIAGNOSIS — Z1231 Encounter for screening mammogram for malignant neoplasm of breast: Secondary | ICD-10-CM

## 2017-01-29 NOTE — Telephone Encounter (Signed)
I just got this. Probably too late but I am fine for her to get her pneumonia shot if someone has time or we could print her an rx to get at pharmacy

## 2017-01-29 NOTE — Telephone Encounter (Signed)
She would get the Prevnar but it might be cheaper for her to get it at a pharmacy she could ask her insurance.

## 2017-01-29 NOTE — Telephone Encounter (Signed)
Patient informed of PCP instructions. 

## 2017-01-31 ENCOUNTER — Other Ambulatory Visit: Payer: Self-pay | Admitting: Family Medicine

## 2017-02-24 ENCOUNTER — Other Ambulatory Visit: Payer: Self-pay | Admitting: Family Medicine

## 2017-02-24 MED ORDER — ATENOLOL 25 MG PO TABS: 25.0000 mg | ORAL_TABLET | Freq: Three times a day (TID) | ORAL | 0 refills | Status: DC

## 2017-03-01 ENCOUNTER — Other Ambulatory Visit: Payer: Self-pay | Admitting: Cardiovascular Disease

## 2017-03-01 ENCOUNTER — Other Ambulatory Visit: Payer: Self-pay | Admitting: Family Medicine

## 2017-03-18 ENCOUNTER — Other Ambulatory Visit: Payer: Self-pay | Admitting: Family Medicine

## 2017-03-19 NOTE — Telephone Encounter (Signed)
Would you like to recheck her vit D level first because last check was 5/17

## 2017-03-30 ENCOUNTER — Other Ambulatory Visit: Payer: Self-pay | Admitting: Cardiovascular Disease

## 2017-03-30 ENCOUNTER — Other Ambulatory Visit: Payer: Self-pay | Admitting: Family Medicine

## 2017-04-21 NOTE — Progress Notes (Signed)
Patient ID: Brenda Lara, female   DOB: [birth year], 66 y.o.   MRN: [id number]   This is a  66 y.o. white female patient with history of coronary artery disease, status post stenting of the RCA in 2006 and LAD in 2005. Repeat cardiac catheterization, May 31, 2009.  showed the patient's stents were widely patent and she had no other significant disease. Her ejection fraction was 55% with mild apical hypokinesis. No chest pain primary following cholesterol LDL under 100 in February  Non angina compliant with meds   S/P right hemicolectomy for colon cancer now in remission.  F/U colonoscopy June 2015 by Dr Henrene Pastor normal  No chest pains   Back in 2006 had atypical bilateral arm pains. Had passed an ETT but cath done when no other etiology of arm pain found She then has a positive nuclear study that lead to her 2nd intervention   No chest pain needs cholesterol checked and new nitro. Cold feet but circulation normal will retire from [employer] center in December Husband drives truck locally  ROS: Denies fever, malais, weight loss, blurry vision, decreased visual acuity, cough, sputum, SOB, hemoptysis, pleuritic pain, palpitaitons, heartburn, abdominal pain, melena, lower extremity edema, claudication, or rash.  All other systems reviewed and negative  General: BP 140/80   Pulse (!) 58   Ht 5\' 1"  (1.549 m)   Wt 137 lb (62.1 kg)   BMI 25.89 kg/m  Affect appropriate Healthy:  appears stated age 67: normal Neck supple with no adenopathy JVP normal no bruits no thyromegaly Lungs clear with no wheezing and good diaphragmatic motion Heart:  S1/S2 no murmur, no rub, gallop or click PMI normal Abdomen: benighn, BS positve, no tenderness, no AAA no bruit.  No HSM or HJR Distal pulses intact with no bruits No edema Neuro non-focal Skin warm and dry No muscular weakness    Normal ETT 02/22/15   Current Outpatient Prescriptions  Medication Sig Dispense Refill  . aspirin 325  MG tablet Take 325 mg by mouth daily.      Marland Kitchen atenolol (TENORMIN) 25 MG tablet Take 1 tablet (25 mg total) by mouth 3 (three) times daily. 270 tablet 0  . baclofen (LIORESAL) 10 MG tablet Take 10 mg by mouth daily as needed for muscle spasms.     Marland Kitchen HEMATINIC/FOLIC ACID 761-9 MG TABS TAKE 1 TABLET BY MOUTH DAILY. 30 each 1  . HEMATINIC/FOLIC ACID 509-3 MG TABS TAKE 1 TABLET BY MOUTH EVERY DAY 30 each 0  . meclizine (ANTIVERT) 25 MG tablet Take 25 mg by mouth 3 (three) times daily as needed for dizziness.    . nitroGLYCERIN (NITROSTAT) 0.4 MG SL tablet Place 1 tablet (0.4 mg total) under the tongue every 5 (five) minutes as needed for chest pain. 25 tablet 4  . OVER THE COUNTER MEDICATION Ideas supplement Take 2 tablets by mouth in the am & 2 tablets by mouth in the pm    . simvastatin (ZOCOR) 40 MG tablet TAKE 1 TABLET BY MOUTH EVERY DAY IN THE EVENING 30 tablet 1  . topiramate (TOPAMAX) 100 MG tablet Take 100 mg by mouth daily.     . vitamin C (ASCORBIC ACID) 500 MG tablet Take 500 mg by mouth daily.    . Vitamin D, Ergocalciferol, (DRISDOL) 50000 units CAPS capsule TAKE 1 CAPSULE (50,000 UNITS TOTAL) BY MOUTH EVERY 7 (SEVEN) DAYS. 4 capsule 4  . Vitamin D, Ergocalciferol, (DRISDOL) 50000 units CAPS capsule TAKE 1 CAPSULE (50,000 UNITS  TOTAL) BY MOUTH EVERY 7 (SEVEN) DAYS. 4 capsule 4   No current facility-administered medications for this visit.     Allergies  Patient has no known allergies.  Electrocardiogram:  01/20/14  NSR rate 67 normal ECG  01/24/15 SR rate 57  Normal ECG   02/05/16  SR rate 59 normal   Assessment and Plan CAD:  Distant history of RCA/LAD stenting  Patent by cath in 2010.  No arm/chest pain normal ECG continue ASA and beta blocker Normal ETT  June 2016 New SL nitro called in   Chol:  On statin labs check today  Cholesterol is at goal.  Continue current dose of statin and diet Rx.  No myalgias or side effects.  F/U  LFT's in 6 months. Lab Results  Component Value Date    LDLCALC 64 01/24/2016    Colon Cancer:  Colonoscopy normal last 2015  in remission fu GI   Jenkins Rouge

## 2017-04-22 ENCOUNTER — Other Ambulatory Visit: Payer: Self-pay | Admitting: Cardiovascular Disease

## 2017-04-22 ENCOUNTER — Ambulatory Visit (INDEPENDENT_AMBULATORY_CARE_PROVIDER_SITE_OTHER): Payer: BLUE CROSS/BLUE SHIELD | Admitting: Cardiovascular Disease

## 2017-04-22 ENCOUNTER — Encounter: Payer: Self-pay | Admitting: Cardiovascular Disease

## 2017-04-22 VITALS — BP 140/80 | HR 58 | Ht 61.0 in | Wt 137.0 lb

## 2017-04-22 DIAGNOSIS — I2510 Atherosclerotic heart disease of native coronary artery without angina pectoris: Secondary | ICD-10-CM | POA: Diagnosis not present

## 2017-04-22 NOTE — Patient Instructions (Addendum)
Medication Instructions:  Your physician recommends that you continue on your current medications as directed. Please refer to the Current Medication list given to you today.   Labwork: LIPIDS, LFTS- today ([facility] to [facility], [facility] results)  Testing/Procedures: None ordered  Follow-Up: Your physician wants you to follow-up in: 1 year with Dr. Johnsie Cancel. You will receive a reminder letter in the mail two months in advance. If you don't receive a letter, please call our office to schedule the follow-up appointment.   Any Other Special Instructions Will Be Listed Below (If Applicable).  Handwritten Rx given for SL NTG    If you need a refill on your cardiac medications before your next appointment, please call your pharmacy.

## 2017-04-22 NOTE — Addendum Note (Signed)
Addended by: Drue Novel I on: 04/22/2017 05:30 PM   Modules accepted: Orders

## 2017-04-23 LAB — LIPID PANEL W/O CHOL/HDL RATIO
Cholesterol, Total: 136 mg/dL (ref 100–199)
HDL: 65 mg/dL (ref >39)
LDL CALC: 56 mg/dL (ref 0–99)
TRIGLYCERIDES: 76 mg/dL (ref 0–149)
VLDL Cholesterol Cal: 15 mg/dL (ref 5–40)

## 2017-04-23 LAB — HEPATIC FUNCTION PANEL
ALBUMIN: 4.6 g/dL (ref 3.6–4.8)
ALT: 20 IU/L (ref 0–32)
AST: 20 IU/L (ref 0–40)
Alkaline Phosphatase: 83 IU/L (ref 39–117)
BILIRUBIN, DIRECT: 0.18 mg/dL (ref 0.00–0.40)
Bilirubin Total: 0.6 mg/dL (ref 0.0–1.2)
Total Protein: 7 g/dL (ref 6.0–8.5)

## 2017-04-23 LAB — AMBIG ABBREV LP DEFAULT

## 2017-04-26 ENCOUNTER — Other Ambulatory Visit: Payer: Self-pay | Admitting: Family Medicine

## 2017-04-30 ENCOUNTER — Other Ambulatory Visit: Payer: Self-pay | Admitting: Cardiovascular Disease

## 2017-04-30 MED ORDER — SIMVASTATIN 40 MG PO TABS: ORAL_TABLET | ORAL | 3 refills | Status: DC

## 2017-05-06 ENCOUNTER — Other Ambulatory Visit: Payer: Self-pay

## 2017-05-06 MED ORDER — FERROUS FUMARATE-FOLIC ACID 324-1 MG PO TABS: 1.0000 | ORAL_TABLET | Freq: Every day | ORAL | 0 refills | Status: DC

## 2017-05-06 NOTE — Telephone Encounter (Signed)
Ins req pt to rec 41C of Hematinic-Folic Acid VUDT/#14 [facility]/thx dmf

## 2017-05-26 ENCOUNTER — Other Ambulatory Visit: Payer: Self-pay | Admitting: Family Medicine

## 2017-06-01 ENCOUNTER — Ambulatory Visit (INDEPENDENT_AMBULATORY_CARE_PROVIDER_SITE_OTHER): Payer: BLUE CROSS/BLUE SHIELD | Admitting: Family Medicine

## 2017-06-01 ENCOUNTER — Encounter: Payer: Self-pay | Admitting: Family Medicine

## 2017-06-01 DIAGNOSIS — Z0000 Encounter for general adult medical examination without abnormal findings: Secondary | ICD-10-CM | POA: Diagnosis not present

## 2017-06-01 DIAGNOSIS — E7800 Pure hypercholesterolemia, unspecified: Secondary | ICD-10-CM

## 2017-06-01 DIAGNOSIS — I25111 Atherosclerotic heart disease of native coronary artery with angina pectoris with documented spasm: Secondary | ICD-10-CM

## 2017-06-01 DIAGNOSIS — E559 Vitamin D deficiency, unspecified: Secondary | ICD-10-CM | POA: Diagnosis not present

## 2017-06-01 DIAGNOSIS — I10 Essential (primary) hypertension: Secondary | ICD-10-CM | POA: Diagnosis not present

## 2017-06-01 DIAGNOSIS — Z23 Encounter for immunization: Secondary | ICD-10-CM | POA: Diagnosis not present

## 2017-06-01 DIAGNOSIS — R51 Headache: Secondary | ICD-10-CM

## 2017-06-01 DIAGNOSIS — Z124 Encounter for screening for malignant neoplasm of cervix: Secondary | ICD-10-CM | POA: Diagnosis not present

## 2017-06-01 DIAGNOSIS — D649 Anemia, unspecified: Secondary | ICD-10-CM

## 2017-06-01 DIAGNOSIS — R519 Headache, unspecified: Secondary | ICD-10-CM

## 2017-06-01 HISTORY — DX: Vitamin D deficiency, unspecified: E55.9

## 2017-06-01 MED ORDER — ATENOLOL 50 MG PO TABS: 50.0000 mg | ORAL_TABLET | Freq: Two times a day (BID) | ORAL | 1 refills | Status: DC

## 2017-06-01 NOTE — Assessment & Plan Note (Signed)
Well controlled, no changes to meds. Encouraged heart healthy diet such as the DASH diet and exercise as tolerated.  °

## 2017-06-01 NOTE — Patient Instructions (Signed)
Shingrix new shingles shot 2 shots taken 2-6 months apart  Preventive Care 40-64 Years, Female Preventive care refers to lifestyle choices and visits with your health care provider that can promote health and wellness. What does preventive care include?  A yearly physical exam. This is also called an annual well check.  Dental exams once or twice a year.  Routine eye exams. Ask your health care provider how often you should have your eyes checked.  Personal lifestyle choices, including: ? Daily care of your teeth and gums. ? Regular physical activity. ? Eating a healthy diet. ? Avoiding tobacco and drug use. ? Limiting alcohol use. ? Practicing safe sex. ? Taking low-dose aspirin daily starting at age 72. ? Taking vitamin and mineral supplements as recommended by your health care provider. What happens during an annual well check? The services and screenings done by your health care provider during your annual well check will depend on your age, overall health, lifestyle risk factors, and family history of disease. Counseling Your health care provider may ask you questions about your:  Alcohol use.  Tobacco use.  Drug use.  Emotional well-being.  Home and relationship well-being.  Sexual activity.  Eating habits.  Work and work Statistician.  Method of birth control.  Menstrual cycle.  Pregnancy history.  Screening You may have the following tests or measurements:  Height, weight, and BMI.  Blood pressure.  Lipid and cholesterol levels. These may be checked every 5 years, or more frequently if you are over 38 years old.  Skin check.  Lung cancer screening. You may have this screening every year starting at age 34 if you have a 30-pack-year history of smoking and currently smoke or have quit within the past 15 years.  Fecal occult blood test (FOBT) of the stool. You may have this test every year starting at age 40.  Flexible sigmoidoscopy or colonoscopy.  You may have a sigmoidoscopy every 5 years or a colonoscopy every 10 years starting at age 47.  Hepatitis C blood test.  Hepatitis B blood test.  Sexually transmitted disease (STD) testing.  Diabetes screening. This is done by checking your blood sugar (glucose) after you have not eaten for a while (fasting). You may have this done every 1-3 years.  Mammogram. This may be done every 1-2 years. Talk to your health care provider about when you should start having regular mammograms. This may depend on whether you have a family history of breast cancer.  BRCA-related cancer screening. This may be done if you have a family history of breast, ovarian, tubal, or peritoneal cancers.  Pelvic exam and Pap test. This may be done every 3 years starting at age 74. Starting at age 33, this may be done every 5 years if you have a Pap test in combination with an HPV test.  Bone density scan. This is done to screen for osteoporosis. You may have this scan if you are at high risk for osteoporosis.  Discuss your test results, treatment options, and if necessary, the need for more tests with your health care provider. Vaccines Your health care provider may recommend certain vaccines, such as:  Influenza vaccine. This is recommended every year.  Tetanus, diphtheria, and acellular pertussis (Tdap, Td) vaccine. You may need a Td booster every 10 years.  Varicella vaccine. You may need this if you have not been vaccinated.  Zoster vaccine. You may need this after age 51.  Measles, mumps, and rubella (MMR) vaccine. You may need  at least one dose of MMR if you were born in 1957 or later. You may also need a second dose.  Pneumococcal 13-valent conjugate (PCV13) vaccine. You may need this if you have certain conditions and were not previously vaccinated.  Pneumococcal polysaccharide (PPSV23) vaccine. You may need one or two doses if you smoke cigarettes or if you have certain conditions.  Meningococcal  vaccine. You may need this if you have certain conditions.  Hepatitis A vaccine. You may need this if you have certain conditions or if you travel or work in places where you may be exposed to hepatitis A.  Hepatitis B vaccine. You may need this if you have certain conditions or if you travel or work in places where you may be exposed to hepatitis B.  Haemophilus influenzae type b (Hib) vaccine. You may need this if you have certain conditions.  Talk to your health care provider about which screenings and vaccines you need and how often you need them. This information is not intended to replace advice given to you by your health care provider. Make sure you discuss any questions you have with your health care provider. Document Released: 09/28/2015 Document Revised: 05/21/2016 Document Reviewed: 07/03/2015 Elsevier Interactive Patient Education  2017 [employer].

## 2017-06-01 NOTE — Assessment & Plan Note (Signed)
Pap last year unremarkable, will not need another pa without any concerning symptoms

## 2017-06-01 NOTE — Assessment & Plan Note (Signed)
[facility] and has been trying to titrate down her Topiramate from 100 mg to 75 mg without a flare in headaches. Denies CP/palp/SOB/HA/congestion/fevers/GI or GU c/o. Taking meds as prescribed

## 2017-06-01 NOTE — Assessment & Plan Note (Signed)
Increase leafy greens, consider increased lean red meat and using cast iron cookware. Continue to monitor, report any concerns 

## 2017-06-01 NOTE — Assessment & Plan Note (Signed)
Tolerating statin, encouraged heart healthy diet, avoid trans fats, minimize simple carbs and saturated fats. Increase exercise as tolerated 

## 2017-06-01 NOTE — Assessment & Plan Note (Signed)
Seen by cardiology recently, will continue to follow with them annual.no concerning sympotms

## 2017-06-01 NOTE — Progress Notes (Signed)
Subjective:  I acted as a Education administrator for Dr. Charlett Blake. [place], [place]  Patient ID: Brenda Lara, female    DOB: 04/29/1951, 66 y.o.   MRN: [id number]  Chief Complaint  Patient presents with  . Annual Exam    HPI  Patient is in today for an annual exam. She feels well and denies any acute concerns. She denies any recent febrile illness or acute hospitalizations. Is eating well and staying active. Doing well at home with ADLs. Denies CP/palp/SOB/HA/congestion/fevers/GI or GU c/o. Taking meds as prescribed  Patient Care Team: Mosie Lukes, MD as PCP - General (Family Medicine) Ladell Pier, MD as Consulting Physician (Oncology)   Past Medical History:  Diagnosis Date  . Anemia   . Angina pectoris   . Anxiety   . Cervical cancer screening 01/31/2016   Menarche at 12 Regular and moderate flow No history of abnormal pap in past G1P1, s/p 1 SVD No history of abnormal MGM No concerns today gyn surgeries b/l tubal ligation and b/y oophorectomy  . Colon cancer ([facility]) 2010  . Coronary artery disease 2001/2005   S/P stenting to RCA and LAD  . GERD (gastroesophageal reflux disease)   . Hearing loss 08/05/2015  . History of chicken pox   . HTN (hypertension)   . Menopause   . Preventative health care 01/31/2016  . Pure hypercholesterolemia   . Vitamin D deficiency 06/01/2017    Past Surgical History:  Procedure Laterality Date  . APPENDECTOMY  02/21/2009  . [place]   multiple  . BILATERAL OOPHORECTOMY  2010  . CHOLECYSTECTOMY  11/09  . COLON SURGERY  2010   right colectomy  . COLONOSCOPY  01/26/2014   NORMAL   . CORONARY STENT PLACEMENT  2001/2005   RCA  . CYSTECTOMY     left ovary    Family History  Problem Relation Age of Onset  . Hypertension Mother   . Transient ischemic attack Mother   . Heart attack Mother        age 66  . Heart disease Mother   . Leukemia Mother   . Heart attack Father        28's  . Cancer Father        prostate  . Heart  disease Father   . Alcohol abuse Father   . Arthritis Father        mva  . Thyroid disease Daughter   . Coronary artery disease Unknown   . Colon cancer Neg Hx   . Pancreatic cancer Neg Hx   . Rectal cancer Neg Hx   . Stomach cancer Neg Hx     Social History   Social History  . Marital status: Married    Spouse name: N/A  . Number of children: N/A  . Years of education: N/A   Occupational History  . Not on file.   Social History Main Topics  . Smoking status: Never Smoker  . Smokeless tobacco: Never Used  . Alcohol use No  . Drug use: No  . 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 Not on file   Social History Narrative  . No narrative on file    Outpatient Medications Prior to Visit  Medication Sig Dispense Refill  . aspirin 325 MG tablet Take 325 mg by mouth daily.      Marland Kitchen  baclofen (LIORESAL) 10 MG tablet Take 10 mg by mouth daily as needed for muscle spasms.     . Ferrous Fumarate-Folic Acid (HEMATINIC/FOLIC ACID) 956-2 MG TABS Take 1 tablet by mouth daily. 90 each 0  . HEMATINIC/FOLIC ACID 130-8 MG TABS TAKE 1 TABLET BY MOUTH EVERY DAY 30 each 0  . meclizine (ANTIVERT) 25 MG tablet Take 25 mg by mouth 3 (three) times daily as needed for dizziness.    . nitroGLYCERIN (NITROSTAT) 0.4 MG SL tablet Place 1 tablet (0.4 mg total) under the tongue every 5 (five) minutes as needed for chest pain. 25 tablet 4  . OVER THE COUNTER MEDICATION Ideas supplement Take 2 tablets by mouth in the am & 2 tablets by mouth in the pm    . simvastatin (ZOCOR) 40 MG tablet TAKE 1 TABLET BY MOUTH EVERY DAY IN THE EVENING 90 tablet 3  . topiramate (TOPAMAX) 100 MG tablet Take 100 mg by mouth daily.     . vitamin C (ASCORBIC ACID) 500 MG tablet Take 500 mg by mouth daily.    . Vitamin D, Ergocalciferol, (DRISDOL) 50000 units CAPS capsule TAKE 1 CAPSULE (50,000 UNITS  TOTAL) BY MOUTH EVERY 7 (SEVEN) DAYS. 4 capsule 4  . Vitamin D, Ergocalciferol, (DRISDOL) 50000 units CAPS capsule TAKE 1 CAPSULE (50,000 UNITS TOTAL) BY MOUTH EVERY 7 (SEVEN) DAYS. 4 capsule 4  . atenolol (TENORMIN) 25 MG tablet Take 1 tablet (25 mg total) by mouth 3 (three) times daily. (Patient taking differently: Take 25 mg by mouth 2 (two) times daily. ) 270 tablet 0   No facility-administered medications prior to visit.     No Known Allergies  Review of Systems  Constitutional: Negative for fever and malaise/fatigue.  HENT: Negative for congestion.   Eyes: Negative for blurred vision.  Respiratory: Negative for cough and shortness of breath.   Cardiovascular: Negative for chest pain, palpitations and leg swelling.  Gastrointestinal: Negative for vomiting.  Musculoskeletal: Negative for back pain.  Skin: Negative for rash.  Neurological: Negative for loss of consciousness and headaches.       Objective:    Physical Exam  Constitutional: She is oriented to person, place, and time. She appears well-developed and well-nourished. No distress.  HENT:  Head: Normocephalic and atraumatic.  Eyes: Conjunctivae are normal.  Neck: Normal range of motion. No thyromegaly present.  Cardiovascular: Normal rate and regular rhythm.   Pulmonary/Chest: Effort normal and breath sounds normal. She has no wheezes.  Abdominal: Soft. Bowel sounds are normal. There is no tenderness.  Musculoskeletal: Normal range of motion. She exhibits no edema or deformity.  Neurological: She is alert and oriented to person, place, and time.  Skin: Skin is warm and dry. She is not diaphoretic.  Psychiatric: She has a normal mood and affect.    BP (!) 168/72   Pulse 64   Temp 98 F (36.7 C) (Oral)   Ht 5\' 1"  (1.549 m)   Wt 137 lb (62.1 kg)   SpO2 98%   BMI 25.89 kg/m  Wt Readings from Last 3 Encounters:  06/01/17 137 lb (62.1 kg)  04/22/17 137 lb (62.1 kg)  01/31/16 142 lb (64.4 kg)   BP Readings  from Last 3 Encounters:  06/01/17 (!) 168/72  04/22/17 140/80  01/31/16 122/76     Immunization History  Administered Date(s) Administered  . Influenza Split 06/27/2011  . Pneumococcal Conjugate-13 06/01/2017  . Tdap 07/02/2011    Health Maintenance  Topic Date Due  . Hepatitis  C Screening  01/21/2018 (Originally May 30, 1951)  . HIV Screening  01/29/2018 (Originally 08/22/1966)  . INFLUENZA VACCINE  07/01/2018 (Originally 04/15/2017)  . MAMMOGRAM  01/29/2018  . PNA vac Low Risk Adult (2 of 2 - PPSV23) 06/01/2018  . COLONOSCOPY  01/27/2019  . PAP SMEAR  01/31/2019  . TETANUS/TDAP  07/01/2021  . DEXA SCAN  Completed    Lab Results  Component Value Date   WBC 7.5 06/01/2017   HGB 14.1 06/01/2017   HCT 42.7 06/01/2017   PLT 271.0 06/01/2017   GLUCOSE 81 06/01/2017   CHOL 136 04/22/2017   TRIG 76 04/22/2017   HDL 65 04/22/2017   LDLCALC 56 04/22/2017   ALT 21 06/01/2017   AST 22 06/01/2017   NA 139 06/01/2017   K 4.0 06/01/2017   CL 105 06/01/2017   CREATININE 0.66 06/01/2017   BUN 14 06/01/2017   CO2 27 06/01/2017   TSH 1.58 06/01/2017   INR 0.95 05/13/2011    Lab Results  Component Value Date   TSH 1.58 06/01/2017   Lab Results  Component Value Date   WBC 7.5 06/01/2017   HGB 14.1 06/01/2017   HCT 42.7 06/01/2017   MCV 89.3 06/01/2017   PLT 271.0 06/01/2017   Lab Results  Component Value Date   NA 139 06/01/2017   K 4.0 06/01/2017   CO2 27 06/01/2017   GLUCOSE 81 06/01/2017   BUN 14 06/01/2017   CREATININE 0.66 06/01/2017   BILITOT 0.6 06/01/2017   ALKPHOS 74 06/01/2017   AST 22 06/01/2017   ALT 21 06/01/2017   PROT 7.1 06/01/2017   ALBUMIN 4.2 06/01/2017   CALCIUM 9.7 06/01/2017   GFR 95.30 06/01/2017   Lab Results  Component Value Date   CHOL 136 04/22/2017   Lab Results  Component Value Date   HDL 65 04/22/2017   Lab Results  Component Value Date   LDLCALC 56 04/22/2017   Lab Results  Component Value Date   TRIG 76 04/22/2017    Lab Results  Component Value Date   CHOLHDL 3 01/24/2016   No results found for: HGBA1C       Assessment & Plan:   Problem List Items Addressed This Visit    HYPERCHOLESTEROLEMIA    Tolerating statin, encouraged heart healthy diet, avoid trans fats, minimize simple carbs and saturated fats. Increase exercise as tolerated.       Relevant Medications   atenolol (TENORMIN) 50 MG tablet   Essential hypertension    Well controlled, no changes to meds. Encouraged heart healthy diet such as the DASH diet and exercise as tolerated.       Relevant Medications   atenolol (TENORMIN) 50 MG tablet   Other Relevant Orders   CBC (Completed)   TSH (Completed)   Comprehensive metabolic panel (Completed)   CAD, NATIVE VESSEL    Seen by cardiology recently, will continue to follow with them annual.no concerning sympotms      Relevant Medications   atenolol (TENORMIN) 50 MG tablet   Headache    [facility] and has been trying to titrate down her Topiramate from 100 mg to 75 mg without a flare in headaches. Denies CP/palp/SOB/HA/congestion/fevers/GI or GU c/o. Taking meds as prescribed      Relevant Medications   atenolol (TENORMIN) 50 MG tablet   Anemia    Increase leafy greens, consider increased lean red meat and using cast iron cookware. Continue to monitor, report any concerns  Cervical cancer screening    Pap last year unremarkable, will not need another pa without any concerning symptoms      Preventative health care    Patient encouraged to maintain heart healthy diet, regular exercise, adequate sleep. Consider daily probiotics. Take medications as prescribed. Given Prevnar and will give Pneumovax in one year. She is advised to try the pharmacy for the Shingrix shot as we do not have.      Vitamin D deficiency    Level wnl on current supplement no changes.       Relevant Orders   VITAMIN D 25 Hydroxy (Vit-D Deficiency, Fractures) (Completed)       I have discontinued Ms. Kargbo's atenolol. I am also having her start on atenolol. Additionally, I am having her maintain her aspirin, baclofen, topiramate, vitamin C, meclizine, OVER THE COUNTER MEDICATION, nitroGLYCERIN, Vitamin D (Ergocalciferol), Vitamin D (Ergocalciferol), simvastatin, Ferrous Fumarate-Folic Acid, and HEMATINIC/FOLIC ACID.  Meds ordered this encounter  Medications  . atenolol (TENORMIN) 50 MG tablet    Sig: Take 1 tablet (50 mg total) by mouth 2 (two) times daily.    Dispense:  30 tablet    Refill:  1    CMA served as scribe during this visit. History, Physical and Plan performed by medical provider. Documentation and orders reviewed and attested to.  Penni Homans, MD

## 2017-06-02 LAB — COMPREHENSIVE METABOLIC PANEL
ALBUMIN: 4.2 g/dL (ref 3.5–5.2)
ALT: 21 U/L (ref 0–35)
AST: 22 U/L (ref 0–37)
Alkaline Phosphatase: 74 U/L (ref 39–117)
BUN: 14 mg/dL (ref 6–23)
CHLORIDE: 105 meq/L (ref 96–112)
CO2: 27 meq/L (ref 19–32)
CREATININE: 0.66 mg/dL (ref 0.40–1.20)
Calcium: 9.7 mg/dL (ref 8.4–10.5)
GFR: 95.3 mL/min (ref >60.00)
Glucose, Bld: 81 mg/dL (ref 70–99)
POTASSIUM: 4 meq/L (ref 3.5–5.1)
SODIUM: 139 meq/L (ref 135–145)
Total Bilirubin: 0.6 mg/dL (ref 0.2–1.2)
Total Protein: 7.1 g/dL (ref 6.0–8.3)

## 2017-06-02 LAB — CBC
HCT: 42.7 % (ref 36.0–46.0)
Hemoglobin: 14.1 g/dL (ref 12.0–15.0)
MCHC: 33.1 g/dL (ref 30.0–36.0)
MCV: 89.3 fl (ref 78.0–100.0)
PLATELETS: 271 10*3/uL (ref 150.0–400.0)
RBC: 4.78 Mil/uL (ref 3.87–5.11)
RDW: 12.9 % (ref 11.5–15.5)
WBC: 7.5 10*3/uL (ref 4.0–10.5)

## 2017-06-02 LAB — VITAMIN D 25 HYDROXY (VIT D DEFICIENCY, FRACTURES): VITD: 55.46 ng/mL (ref 30.00–100.00)

## 2017-06-02 LAB — TSH: TSH: 1.58 u[IU]/mL (ref 0.35–4.50)

## 2017-06-03 NOTE — Assessment & Plan Note (Addendum)
Patient encouraged to maintain heart healthy diet, regular exercise, adequate sleep. Consider daily probiotics. Take medications as prescribed. Given Prevnar and will give Pneumovax in one year. She is advised to try the pharmacy for the Shingrix shot as we do not have.

## 2017-06-03 NOTE — Assessment & Plan Note (Signed)
Level wnl on current supplement no changes.

## 2017-06-23 ENCOUNTER — Other Ambulatory Visit: Payer: Self-pay | Admitting: Family Medicine

## 2017-06-26 ENCOUNTER — Other Ambulatory Visit: Payer: Self-pay

## 2017-06-30 ENCOUNTER — Ambulatory Visit (INDEPENDENT_AMBULATORY_CARE_PROVIDER_SITE_OTHER): Payer: BLUE CROSS/BLUE SHIELD | Admitting: Family Medicine

## 2017-06-30 VITALS — BP 150/77 | HR 60

## 2017-06-30 DIAGNOSIS — I10 Essential (primary) hypertension: Secondary | ICD-10-CM

## 2017-06-30 NOTE — Progress Notes (Addendum)
Pre visit review using our clinic tool,if applicable. No additional management support is needed unless otherwise documented below in the visit note.   Patient in for BP check per order from Dr. Frederik Pear dated 06/01/17.  Last BP = 140/70 P= 64  Patient taking Atenolol 50 mg bid. Which was increased from 25 mg.  No complaints voiced this visit.  BP today = 131/76 P=60  Per Dr. Wallace Keller taking medications as ordered. Return for office visit within 6 months. Appointment scheduled with patient copy of lab results also given to patient.  Nurse blood pressure check note reviewed. Agree with documention and plan.

## 2017-09-09 ENCOUNTER — Telehealth: Payer: Self-pay | Admitting: Cardiovascular Disease

## 2017-09-09 NOTE — Telephone Encounter (Signed)
Left message for patient to call back  

## 2017-09-09 NOTE — Telephone Encounter (Signed)
New Message  Pt call requesting to speak with RN about getting orders in for lab work to be completed in feb. Please call back to discuss

## 2017-09-29 NOTE — Telephone Encounter (Signed)
Left second message for patient to call back. Patient needs fasting lipid and liver panel in February, per lab results from 04/22/17.

## 2017-10-09 NOTE — Telephone Encounter (Signed)
Sent MyChart message for patient to schedule lab work.

## 2017-10-12 ENCOUNTER — Other Ambulatory Visit: Payer: Self-pay

## 2017-10-12 DIAGNOSIS — I10 Essential (primary) hypertension: Secondary | ICD-10-CM

## 2017-10-12 DIAGNOSIS — E7800 Pure hypercholesterolemia, unspecified: Secondary | ICD-10-CM

## 2017-10-12 NOTE — Progress Notes (Unsigned)
Put in lab orders for lipid and liver panel for 6 month f/u.

## 2017-10-30 ENCOUNTER — Other Ambulatory Visit: Payer: Medicare Other | Admitting: *Deleted

## 2017-10-30 DIAGNOSIS — I10 Essential (primary) hypertension: Secondary | ICD-10-CM

## 2017-10-30 DIAGNOSIS — G43719 Chronic migraine without aura, intractable, without status migrainosus: Secondary | ICD-10-CM | POA: Diagnosis not present

## 2017-10-30 DIAGNOSIS — G43019 Migraine without aura, intractable, without status migrainosus: Secondary | ICD-10-CM | POA: Diagnosis not present

## 2017-10-30 DIAGNOSIS — E7800 Pure hypercholesterolemia, unspecified: Secondary | ICD-10-CM

## 2017-10-30 LAB — HEPATIC FUNCTION PANEL
ALT: 19 IU/L (ref 0–32)
AST: 18 IU/L (ref 0–40)
Albumin: 4 g/dL (ref 3.6–4.8)
Alkaline Phosphatase: 90 IU/L (ref 39–117)
Bilirubin Total: 0.3 mg/dL (ref 0.0–1.2)
Bilirubin, Direct: 0.11 mg/dL (ref 0.00–0.40)
TOTAL PROTEIN: 6.4 g/dL (ref 6.0–8.5)

## 2017-10-30 LAB — LIPID PANEL
CHOL/HDL RATIO: 3.4 ratio (ref 0.0–4.4)
Cholesterol, Total: 189 mg/dL (ref 100–199)
HDL: 56 mg/dL (ref >39)
LDL CALC: 98 mg/dL (ref 0–99)
Triglycerides: 173 mg/dL — ABNORMAL HIGH (ref 0–149)
VLDL CHOLESTEROL CAL: 35 mg/dL (ref 5–40)

## 2017-11-06 ENCOUNTER — Telehealth: Payer: Self-pay

## 2017-11-06 DIAGNOSIS — Z79899 Other long term (current) drug therapy: Secondary | ICD-10-CM

## 2017-11-06 DIAGNOSIS — E7800 Pure hypercholesterolemia, unspecified: Secondary | ICD-10-CM

## 2017-11-06 MED ORDER — ATORVASTATIN CALCIUM 40 MG PO TABS: 40.0000 mg | ORAL_TABLET | Freq: Every day | ORAL | 3 refills | Status: DC

## 2017-11-06 NOTE — Telephone Encounter (Signed)
Will send message through [place]. Will send copy to patient's PCP.   "Per Dr. Johnsie Cancel, your LDL is at 77 and too high for having coronary artery disease and stents. He wants you to stop taking Simvastatin and start taking Lipitor 40 mg by mouth daily. Dr. Johnsie Cancel also wants to get repeat lab work in 3 months. Your liver function test was fine. He also suggest to keep on a low fat diet. Please call our office to schedule lab work, or if you have any questions about these changes. [phone]."  Left message for patient to call back as well.

## 2017-11-06 NOTE — Telephone Encounter (Signed)
-----   Message from Josue Hector, MD sent at 10/30/2017  5:14 PM EST ----- LDL 98 too high for CAD/Stents d/c zocor start lipitor 40 mg daily f/u labs 3 months low fat diet LFls fine

## 2017-11-12 NOTE — Telephone Encounter (Signed)
Sent message to patient through MyChart. 

## 2017-11-13 ENCOUNTER — Ambulatory Visit (INDEPENDENT_AMBULATORY_CARE_PROVIDER_SITE_OTHER): Payer: Medicare Other | Admitting: Family Medicine

## 2017-11-13 ENCOUNTER — Encounter: Payer: Self-pay | Admitting: Family Medicine

## 2017-11-13 VITALS — BP 157/73 | HR 54 | Temp 98.0°F | Resp 16 | Ht 61.0 in | Wt 139.2 lb

## 2017-11-13 DIAGNOSIS — M79605 Pain in left leg: Secondary | ICD-10-CM | POA: Diagnosis not present

## 2017-11-13 DIAGNOSIS — M79602 Pain in left arm: Secondary | ICD-10-CM | POA: Diagnosis not present

## 2017-11-13 DIAGNOSIS — I2510 Atherosclerotic heart disease of native coronary artery without angina pectoris: Secondary | ICD-10-CM | POA: Diagnosis not present

## 2017-11-13 DIAGNOSIS — I10 Essential (primary) hypertension: Secondary | ICD-10-CM | POA: Diagnosis not present

## 2017-11-13 DIAGNOSIS — E7800 Pure hypercholesterolemia, unspecified: Secondary | ICD-10-CM | POA: Diagnosis not present

## 2017-11-13 DIAGNOSIS — E559 Vitamin D deficiency, unspecified: Secondary | ICD-10-CM

## 2017-11-13 DIAGNOSIS — R51 Headache: Secondary | ICD-10-CM

## 2017-11-13 DIAGNOSIS — K219 Gastro-esophageal reflux disease without esophagitis: Secondary | ICD-10-CM

## 2017-11-13 DIAGNOSIS — R519 Headache, unspecified: Secondary | ICD-10-CM

## 2017-11-13 MED ORDER — LISINOPRIL 10 MG PO TABS
10.0000 mg | ORAL_TABLET | Freq: Two times a day (BID) | ORAL | 1 refills | Status: DC
Start: 2017-11-13 — End: 2017-12-15

## 2017-11-13 MED ORDER — ATENOLOL 50 MG PO TABS: 50.0000 mg | ORAL_TABLET | Freq: Two times a day (BID) | ORAL | 3 refills | Status: DC

## 2017-11-13 MED ORDER — ASPIRIN 325 MG PO TABS: 325.0000 mg | ORAL_TABLET | Freq: Every day | ORAL | Status: DC

## 2017-11-13 MED ORDER — RANITIDINE HCL 300 MG PO TABS: 300.0000 mg | ORAL_TABLET | Freq: Every evening | ORAL | 3 refills | Status: DC | PRN

## 2017-11-13 NOTE — Patient Instructions (Addendum)
  Shingrix is the new shingles shot 2 shots over 2-6 months at [facility] for Gastroesophageal Reflux Disease, Adult When you have gastroesophageal reflux disease (GERD), the foods you eat and your eating habits are very important. Choosing the right foods can help ease your discomfort. What guidelines do I need to follow?  Choose fruits, vegetables, whole grains, and low-fat dairy products.  Choose low-fat meat, fish, and poultry.  Limit fats such as oils, salad dressings, butter, nuts, and avocado.  Keep a food diary. This helps you identify foods that cause symptoms.  Avoid foods that cause symptoms. These may be different for everyone.  Eat small meals often instead of 3 large meals a day.  Eat your meals slowly, in a place where you are relaxed.  Limit fried foods.  Cook foods using methods other than frying.  Avoid drinking alcohol.  Avoid drinking large amounts of liquids with your meals.  Avoid bending over or lying down until 2-3 hours after eating. What foods are not recommended? These are some foods and drinks that may make your symptoms worse: Vegetables Tomatoes. Tomato juice. Tomato and spaghetti sauce. Chili peppers. Onion and garlic. Horseradish. Fruits Oranges, grapefruit, and lemon (fruit and juice). Meats High-fat meats, fish, and poultry. This includes hot dogs, ribs, ham, sausage, salami, and bacon. Dairy Whole milk and chocolate milk. Sour cream. Cream. Butter. Ice cream. Cream cheese. Drinks Coffee and tea. Bubbly (carbonated) drinks or energy drinks. Condiments Hot sauce. Barbecue sauce. Sweets/Desserts Chocolate and cocoa. Donuts. Peppermint and spearmint. Fats and Oils High-fat foods. This includes [place] fries and potato chips. Other Vinegar. Strong spices. This includes black pepper, white pepper, red pepper, cayenne, curry powder, cloves, ginger, and chili powder. The items listed above may not be a complete list of foods and  drinks to avoid. Contact your dietitian for more information. This information is not intended to replace advice given to you by your health care provider. Make sure you discuss any questions you have with your health care provider. Document Released: 03/02/2012 Document Revised: 02/07/2016 Document Reviewed: 07/06/2013 Elsevier Interactive Patient Education  2017 [place] over 2-6 months. At pharmacy

## 2017-11-13 NOTE — Progress Notes (Signed)
Patient ID: Brenda Lara, female   DOB: Mar 19, 1951, 67 y.o.   MRN: [id number]    Subjective:  I acted as a Education administrator for Dr. Charlett Blake.  Guerry Bruin, [place].   Patient ID: Brenda Lara, female    DOB: 13-Dec-1950, 67 y.o.   MRN: [id number]  Chief Complaint  Patient presents with  . Hypertension  . vitamin d question     HPI  Patient is in today for follow up blood pressure.  Does occasionally check at home and has been running in 150s over 70s.  She has has some headaches.  Also she wanted to know if she should be taking vitamin D 50,000 units?  She has been taking 3,000 units OTC.  Her last vitamin D level was normal. She has been noting some intermittent headaches without associated symptoms and her bp has been running high similar to what we see in the office. She also notes a recent flare in her heart burn and she has not been taking any meds to manage. Denies CP/palp/SOB/HA/congestion/fevers/GI or GU c/o. Taking meds as prescribed  Patient Care Team: Mosie Lukes, MD as PCP - General (Family Medicine) Ladell Pier, MD as Consulting Physician (Oncology)   Past Medical History:  Diagnosis Date  . Anemia   . Angina pectoris   . Anxiety   . Cervical cancer screening 01/31/2016   Menarche at 12 Regular and moderate flow No history of abnormal pap in past G1P1, s/p 1 SVD No history of abnormal MGM No concerns today gyn surgeries b/l tubal ligation and b/y oophorectomy  . Colon cancer ([facility]) 2010  . Coronary artery disease 2001/2005   S/P stenting to RCA and LAD  . GERD (gastroesophageal reflux disease)   . Hearing loss 08/05/2015  . History of chicken pox   . HTN (hypertension)   . Menopause   . Preventative health care 01/31/2016  . Pure hypercholesterolemia   . Vitamin D deficiency 06/01/2017    Past Surgical History:  Procedure Laterality Date  . APPENDECTOMY  02/21/2009  . [facility]   multiple  . BILATERAL OOPHORECTOMY  2010  . CHOLECYSTECTOMY  11/09  .  COLON SURGERY  2010   right colectomy  . COLONOSCOPY  01/26/2014   NORMAL   . CORONARY STENT PLACEMENT  2001/2005   RCA  . CYSTECTOMY     left ovary    Family History  Problem Relation Age of Onset  . Hypertension Mother   . Transient ischemic attack Mother   . Heart attack Mother        age 38  . Heart disease Mother   . Leukemia Mother   . Heart attack Father        62's  . Cancer Father        prostate  . Heart disease Father   . Alcohol abuse Father   . Arthritis Father        mva  . Thyroid disease Daughter   . Coronary artery disease Unknown   . Colon cancer Neg Hx   . Pancreatic cancer Neg Hx   . Rectal cancer Neg Hx   . Stomach cancer Neg Hx     Social History   Socioeconomic History  . Marital status: Married    Spouse name: Not on file  . Number of children: Not on file  . Years of education: Not on file  . Highest education level: Not on file  Social Needs  .  Financial resource strain: Not on file  . Food insecurity - worry: Not on file  . Food insecurity - inability: Not on file  . Transportation needs - medical: Not on file  . Transportation needs - non-medical: Not on file  Occupational History  . Not on file  Tobacco Use  . Smoking status: Never Smoker  . Smokeless tobacco: Never Used  Substance and Sexual Activity  . Alcohol use: No    Alcohol/week: 0.5 oz    Types: 1 Standard drinks or equivalent per week  . Drug use: No  . 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 Not on file  Social History Narrative  . Not on file    Outpatient Medications Prior to Visit  Medication Sig Dispense Refill  . atorvastatin (LIPITOR) 40 MG tablet Take 1 tablet (40 mg total) by mouth daily. 90 tablet 3  . baclofen (LIORESAL) 10 MG tablet Take 10 mg by mouth daily as needed for muscle spasms.     Marland Kitchen HEMATINIC/FOLIC ACID 235-3 MG  TABS TAKE 1 TABLET BY MOUTH EVERY DAY 90 each 3  . meclizine (ANTIVERT) 25 MG tablet Take 25 mg by mouth 3 (three) times daily as needed for dizziness.    . nitroGLYCERIN (NITROSTAT) 0.4 MG SL tablet Place 1 tablet (0.4 mg total) under the tongue every 5 (five) minutes as needed for chest pain. 25 tablet 4  . topiramate (TOPAMAX) 100 MG tablet Take 100 mg by mouth daily.     . vitamin C (ASCORBIC ACID) 500 MG tablet Take 500 mg by mouth daily.    Marland Kitchen aspirin 325 MG tablet Take 325 mg by mouth daily.      Marland Kitchen atenolol (TENORMIN) 50 MG tablet Take 1 tablet (50 mg total) by mouth 2 (two) times daily. 30 tablet 1  . Ferrous Fumarate-Folic Acid (HEMATINIC/FOLIC ACID) 614-4 MG TABS Take 1 tablet by mouth daily. 90 each 0  . Vitamin D, Ergocalciferol, (DRISDOL) 50000 units CAPS capsule TAKE 1 CAPSULE (50,000 UNITS TOTAL) BY MOUTH EVERY 7 (SEVEN) DAYS. 4 capsule 4  . OVER THE COUNTER MEDICATION Ideas supplement Take 2 tablets by mouth in the am & 2 tablets by mouth in the pm     No facility-administered medications prior to visit.     No Known Allergies  Review of Systems  Constitutional: Negative for fever and malaise/fatigue.  HENT: Negative for congestion.   Eyes: Negative for blurred vision.  Respiratory: Negative for cough and shortness of breath.   Cardiovascular: Negative for chest pain, palpitations and leg swelling.  Gastrointestinal: Negative for vomiting.  Musculoskeletal: Negative for back pain.       Pain in left arm and left calf.  Skin: Negative for rash.  Neurological: Positive for headaches. Negative for loss of consciousness.       Objective:    Physical Exam  Constitutional: She is oriented to person, place, and time. She appears well-developed and well-nourished. No distress.  HENT:  Head: Normocephalic and atraumatic.  Nose: Nose normal.  Eyes: Right eye exhibits no discharge. Left eye exhibits no discharge.  Neck: Normal range of motion. Neck supple.  Cardiovascular:  Normal rate and regular rhythm.  No murmur heard. Pulmonary/Chest: Effort normal and breath sounds normal.  Abdominal: Soft. Bowel sounds are normal. There is no tenderness.  Musculoskeletal: She exhibits no edema.  Neurological: She is alert  and oriented to person, place, and time.  Skin: Skin is warm and dry.  Psychiatric: She has a normal mood and affect.  Nursing note and vitals reviewed.   BP (!) 157/73 (BP Location: Left Arm, Cuff Size: Normal)   Pulse (!) 54   Temp 98 F (36.7 C) (Oral)   Resp 16   Ht 5\' 1"  (1.549 m)   Wt 139 lb 3.2 oz (63.1 kg)   SpO2 99%   BMI 26.30 kg/m  Wt Readings from Last 3 Encounters:  11/13/17 139 lb 3.2 oz (63.1 kg)  06/01/17 137 lb (62.1 kg)  04/22/17 137 lb (62.1 kg)   BP Readings from Last 3 Encounters:  11/13/17 (!) 157/73  06/30/17 (!) 150/77  06/03/17 140/74     Immunization History  Administered Date(s) Administered  . Influenza Split 06/27/2011  . Influenza-Unspecified 06/17/2017  . Pneumococcal Conjugate-13 06/01/2017  . Tdap 07/02/2011    Health Maintenance  Topic Date Due  . Hepatitis C Screening  01/21/2018 (Originally 11-Jun-1951)  . MAMMOGRAM  01/29/2018  . PNA vac Low Risk Adult (2 of 2 - PPSV23) 06/01/2018  . COLONOSCOPY  01/27/2019  . TETANUS/TDAP  07/01/2021  . INFLUENZA VACCINE  Completed  . DEXA SCAN  Completed    Lab Results  Component Value Date   WBC 7.5 06/01/2017   HGB 14.1 06/01/2017   HCT 42.7 06/01/2017   PLT 271.0 06/01/2017   GLUCOSE 81 06/01/2017   CHOL 189 10/30/2017   TRIG 173 (H) 10/30/2017   HDL 56 10/30/2017   LDLCALC 98 10/30/2017   ALT 19 10/30/2017   AST 18 10/30/2017   NA 139 06/01/2017   K 4.0 06/01/2017   CL 105 06/01/2017   CREATININE 0.66 06/01/2017   BUN 14 06/01/2017   CO2 27 06/01/2017   TSH 1.58 06/01/2017   INR 0.95 05/13/2011    Lab Results  Component Value Date   TSH 1.58 06/01/2017   Lab Results  Component Value Date   WBC 7.5 06/01/2017   HGB 14.1  06/01/2017   HCT 42.7 06/01/2017   MCV 89.3 06/01/2017   PLT 271.0 06/01/2017   Lab Results  Component Value Date   NA 139 06/01/2017   K 4.0 06/01/2017   CO2 27 06/01/2017   GLUCOSE 81 06/01/2017   BUN 14 06/01/2017   CREATININE 0.66 06/01/2017   BILITOT 0.3 10/30/2017   ALKPHOS 90 10/30/2017   AST 18 10/30/2017   ALT 19 10/30/2017   PROT 6.4 10/30/2017   ALBUMIN 4.0 10/30/2017   CALCIUM 9.7 06/01/2017   GFR 95.30 06/01/2017   Lab Results  Component Value Date   CHOL 189 10/30/2017   Lab Results  Component Value Date   HDL 56 10/30/2017   Lab Results  Component Value Date   LDLCALC 98 10/30/2017   Lab Results  Component Value Date   TRIG 173 (H) 10/30/2017   Lab Results  Component Value Date   CHOLHDL 3.4 10/30/2017   No results found for: HGBA1C       Assessment & Plan:   Problem List Items Addressed This Visit    HYPERCHOLESTEROLEMIA    Tolerating statin, encouraged heart healthy diet, avoid trans fats, minimize simple carbs and saturated fats. Increase exercise as tolerated      Relevant Medications   aspirin 325 MG tablet   atenolol (TENORMIN) 50 MG tablet   lisinopril (PRINIVIL,ZESTRIL) 10 MG tablet   Essential hypertension    ontinue Atenolol and add  Lisinopril 5 mg bid. Poorly controlled will alter medications, encouraged DASH diet, minimize caffeine and obtain adequate sleep. Report concerning symptoms and follow up as directed and as needed      Relevant Medications   aspirin 325 MG tablet   atenolol (TENORMIN) 50 MG tablet   lisinopril (PRINIVIL,ZESTRIL) 10 MG tablet   Coronary artery disease involving native heart without angina pectoris    Patient is complaining of b/l upper arm discomfort similar to what she felt when she had her first cardiac stent placed in 2001 and again when she had her second stent placed in 2005. Neither time does she endorse significant chest pain. Her EKG today is unremarkable for any acute concerns but gven her  symptoms she is referred back to cardiology for consideration and she will keep her NTG with her and if symptoms worse or occur and do not remit she should seek care. Try using NTG prn fo r the b/l arm pain and see if it is helpful.       Relevant Medications   aspirin 325 MG tablet   atenolol (TENORMIN) 50 MG tablet   lisinopril (PRINIVIL,ZESTRIL) 10 MG tablet   Other Relevant Orders   EKG 12-Lead (Completed)   GERD    Avoid offending foods, start probiotics. Do not eat large meals in late evening and consider raising head of bed. Try ranidine 300 mg po qhs due to recent flare in symptoms      Relevant Medications   ranitidine (ZANTAC) 300 MG tablet   Vitamin D deficiency - Primary   Relevant Orders   Comprehensive metabolic panel   VITAMIN D 25 Hydroxy (Vit-D Deficiency, Fractures)   Left leg pain    With posterior calf pain will proceed with ultrasound to rule out DVT      Relevant Orders   US Venous Img Lower Unilateral Left   Left arm pain    With a new varicose vein. ultrasound is ordered to rule out a DVT      Relevant Orders   [place] UPPER EXTREMITY DUPLEX LEFT (NON-WBI)      I have discontinued Hassan Rowan A. Fritsch's OVER THE COUNTER MEDICATION, Vitamin D (Ergocalciferol), and Ferrous Fumarate-Folic Acid. I have also changed her aspirin. Additionally, I am having her start on lisinopril and ranitidine. Lastly, I am having her maintain her baclofen, topiramate, vitamin C, meclizine, nitroGLYCERIN, HEMATINIC/FOLIC ACID, atorvastatin, and atenolol.  Meds ordered this encounter  Medications  . aspirin 325 MG tablet    Sig: Take 1 tablet (325 mg total) by mouth daily.  Marland Kitchen atenolol (TENORMIN) 50 MG tablet    Sig: Take 1 tablet (50 mg total) by mouth 2 (two) times daily.    Dispense:  180 tablet    Refill:  3  . lisinopril (PRINIVIL,ZESTRIL) 10 MG tablet    Sig: Take 1 tablet (10 mg total) by mouth 2 (two) times daily.    Dispense:  180 tablet    Refill:  1  . ranitidine  (ZANTAC) 300 MG tablet    Sig: Take 1 tablet (300 mg total) by mouth at bedtime as needed for heartburn.    Dispense:  30 tablet    Refill:  3    CMA served as scribe during this visit. History, Physical and Plan performed by medical provider. Documentation and orders reviewed and attested to.  Penni Homans, MD

## 2017-11-15 DIAGNOSIS — M79602 Pain in left arm: Secondary | ICD-10-CM | POA: Insufficient documentation

## 2017-11-15 DIAGNOSIS — M79605 Pain in left leg: Secondary | ICD-10-CM | POA: Insufficient documentation

## 2017-11-15 NOTE — Assessment & Plan Note (Signed)
With a new varicose vein. ultrasound is ordered to rule out a DVT

## 2017-11-15 NOTE — Assessment & Plan Note (Signed)
Tolerating statin, encouraged heart healthy diet, avoid trans fats, minimize simple carbs and saturated fats. Increase exercise as tolerated 

## 2017-11-15 NOTE — Assessment & Plan Note (Signed)
Avoid offending foods, start probiotics. Do not eat large meals in late evening and consider raising head of bed. Try ranidine 300 mg po qhs due to recent flare in symptoms

## 2017-11-15 NOTE — Assessment & Plan Note (Signed)
ontinue Atenolol and add Lisinopril 5 mg bid. Poorly controlled will alter medications, encouraged DASH diet, minimize caffeine and obtain adequate sleep. Report concerning symptoms and follow up as directed and as needed

## 2017-11-15 NOTE — Assessment & Plan Note (Signed)
Patient is complaining of b/l upper arm discomfort similar to what she felt when she had her first cardiac stent placed in 2001 and again when she had her second stent placed in 2005. Neither time does she endorse significant chest pain. Her EKG today is unremarkable for any acute concerns but gven her symptoms she is referred back to cardiology for consideration and she will keep her NTG with her and if symptoms worse or occur and do not remit she should seek care. Try using NTG prn fo r the b/l arm pain and see if it is helpful.

## 2017-11-15 NOTE — Assessment & Plan Note (Signed)
Encouraged increased hydration, 64 ounces of clear fluids daily. Minimize alcohol and caffeine. Eat small frequent meals with lean proteins and complex carbs. Avoid high and low blood sugars. Get adequate sleep, 7-8 hours a night. Needs exercise daily preferably in the morning.  

## 2017-11-15 NOTE — Assessment & Plan Note (Signed)
With posterior calf pain will proceed with ultrasound to rule out DVT

## 2017-11-16 ENCOUNTER — Ambulatory Visit (HOSPITAL_BASED_OUTPATIENT_CLINIC_OR_DEPARTMENT_OTHER)
Admission: RE | Admit: 2017-11-16 | Discharge: 2017-11-16 | Disposition: A | Discharge status: Home / Self Care | Payer: Medicare Other | Source: Ambulatory Visit | Attending: Family Medicine | Admitting: Family Medicine

## 2017-11-16 DIAGNOSIS — M79605 Pain in left leg: Secondary | ICD-10-CM

## 2017-11-16 DIAGNOSIS — M79602 Pain in left arm: Secondary | ICD-10-CM

## 2017-11-16 DIAGNOSIS — R0989 Other specified symptoms and signs involving the circulatory and respiratory systems: Secondary | ICD-10-CM | POA: Diagnosis not present

## 2017-11-16 DIAGNOSIS — M79632 Pain in left forearm: Secondary | ICD-10-CM | POA: Diagnosis not present

## 2017-11-16 DIAGNOSIS — M79662 Pain in left lower leg: Secondary | ICD-10-CM | POA: Diagnosis not present

## 2017-11-23 DIAGNOSIS — R55 Syncope and collapse: Secondary | ICD-10-CM | POA: Diagnosis not present

## 2017-11-23 DIAGNOSIS — R112 Nausea with vomiting, unspecified: Secondary | ICD-10-CM | POA: Diagnosis not present

## 2017-11-23 DIAGNOSIS — R404 Transient alteration of awareness: Secondary | ICD-10-CM | POA: Diagnosis not present

## 2017-11-23 DIAGNOSIS — R197 Diarrhea, unspecified: Secondary | ICD-10-CM | POA: Diagnosis not present

## 2017-11-27 ENCOUNTER — Ambulatory Visit: Payer: BLUE CROSS/BLUE SHIELD | Admitting: Family Medicine

## 2017-12-15 ENCOUNTER — Other Ambulatory Visit: Payer: Medicare Other

## 2017-12-15 ENCOUNTER — Ambulatory Visit (INDEPENDENT_AMBULATORY_CARE_PROVIDER_SITE_OTHER): Payer: Medicare Other | Admitting: Family Medicine

## 2017-12-15 VITALS — BP 168/96 | HR 57

## 2017-12-15 DIAGNOSIS — I10 Essential (primary) hypertension: Secondary | ICD-10-CM

## 2017-12-15 DIAGNOSIS — I2510 Atherosclerotic heart disease of native coronary artery without angina pectoris: Secondary | ICD-10-CM

## 2017-12-15 LAB — COMPREHENSIVE METABOLIC PANEL
ALK PHOS: 88 U/L (ref 39–117)
ALT: 25 U/L (ref 0–35)
AST: 23 U/L (ref 0–37)
Albumin: 3.8 g/dL (ref 3.5–5.2)
BILIRUBIN TOTAL: 0.6 mg/dL (ref 0.2–1.2)
BUN: 14 mg/dL (ref 6–23)
CO2: 26 mEq/L (ref 19–32)
Calcium: 9 mg/dL (ref 8.4–10.5)
Chloride: 105 mEq/L (ref 96–112)
Creatinine, Ser: 0.6 mg/dL (ref 0.40–1.20)
GFR: 106.2 mL/min (ref >60.00)
Glucose, Bld: 89 mg/dL (ref 70–99)
Potassium: 3.9 mEq/L (ref 3.5–5.1)
SODIUM: 137 meq/L (ref 135–145)
TOTAL PROTEIN: 6.7 g/dL (ref 6.0–8.3)

## 2017-12-15 LAB — VITAMIN D 25 HYDROXY (VIT D DEFICIENCY, FRACTURES): VITD: 25.87 ng/mL — ABNORMAL LOW (ref 30.00–100.00)

## 2017-12-15 MED ORDER — IRBESARTAN 75 MG PO TABS: 75.0000 mg | ORAL_TABLET | Freq: Every day | ORAL | 0 refills | Status: DC

## 2017-12-15 NOTE — Progress Notes (Signed)
Pre visit review using our clinic review tool, if applicable. No additional management support is needed unless otherwise documented below in the visit note.  Pt here for BP check per Dr Charlett Blake.  BP Readings from Last 3 Encounters:  11/13/17 (!) 157/73  06/30/17 (!) 150/77  06/03/17 140/74   Pt currently takes atenolol 50mg  twice a day. Lisinopril 10mg  twice a day was added on 11/13/17.  Pt states she developed a cold in early March. Became very weak and had episode of passing out multiple times on 11/23/17. Pt was taken to the ER at [facility] because her "BP was so low and she kept passing out". Was told she was very dehydrated. Pt states she has not taken Lisinopril since 11/23/17 and is afraid to restart it.  Pt also reports falling after climbing out of her truck yesterday. Pt states she fell onto her back and hit the back of her head. Has bump on back of head but did not have any bleeding. Pt denies any visual changes, n/v, confusion or loss of consciousness. Advised pt per verbal from PCP, to call for appointment if she develops any of the above symptoms and pt voices understanding.  BP @ 9:49am = 183/81 automated BP manual = 168/86 HR = 57 Repeat BP 10am = 144/82  Per verbal from PCP, pt to start Irbesartan 75mg  once a day and continue Atenolol 50mg  twice a day. Return for blood pressure check on 12/29/17 at 3pm and we will repeat lab at 2:45pm the same day.  Nursing blood pressure check note reviewed. Agree with documention and plan.

## 2017-12-15 NOTE — Patient Instructions (Signed)
Start Irbesartan 75mg  once a day and continue Atenolol 50mg  twice a day. Remain off of Lisinopril.  Return for blood pressure check on 12/29/17 at 3pm and we will repeat lab at 2:45pm the same day.

## 2017-12-15 NOTE — Progress Notes (Deleted)
BP Readings from Last 3 Encounters:  11/13/17 (!) 157/73  06/30/17 (!) 150/77  06/03/17 140/74

## 2017-12-17 MED ORDER — VITAMIN D (ERGOCALCIFEROL) 1.25 MG (50000 UNIT) PO CAPS: 50000.0000 [IU] | ORAL_CAPSULE | ORAL | 4 refills | Status: DC

## 2017-12-17 NOTE — Addendum Note (Signed)
Addended by: [name] A on: 12/17/2017 07:26 AM   Modules accepted: Orders

## 2017-12-29 ENCOUNTER — Other Ambulatory Visit (INDEPENDENT_AMBULATORY_CARE_PROVIDER_SITE_OTHER): Payer: Medicare Other

## 2017-12-29 ENCOUNTER — Ambulatory Visit (INDEPENDENT_AMBULATORY_CARE_PROVIDER_SITE_OTHER): Payer: Medicare Other | Admitting: Family Medicine

## 2017-12-29 DIAGNOSIS — I10 Essential (primary) hypertension: Secondary | ICD-10-CM

## 2017-12-29 DIAGNOSIS — I2510 Atherosclerotic heart disease of native coronary artery without angina pectoris: Secondary | ICD-10-CM | POA: Diagnosis not present

## 2017-12-29 LAB — COMPREHENSIVE METABOLIC PANEL
ALK PHOS: 98 U/L (ref 39–117)
ALT: 23 U/L (ref 0–35)
AST: 19 U/L (ref 0–37)
Albumin: 4 g/dL (ref 3.5–5.2)
BUN: 14 mg/dL (ref 6–23)
CHLORIDE: 104 meq/L (ref 96–112)
CO2: 26 mEq/L (ref 19–32)
Calcium: 9.4 mg/dL (ref 8.4–10.5)
Creatinine, Ser: 0.72 mg/dL (ref 0.40–1.20)
GFR: 86.04 mL/min (ref >60.00)
GLUCOSE: 82 mg/dL (ref 70–99)
POTASSIUM: 3.9 meq/L (ref 3.5–5.1)
SODIUM: 135 meq/L (ref 135–145)
TOTAL PROTEIN: 6.7 g/dL (ref 6.0–8.3)
Total Bilirubin: 0.5 mg/dL (ref 0.2–1.2)

## 2017-12-29 MED ORDER — MECLIZINE HCL 25 MG PO TABS: 25.0000 mg | ORAL_TABLET | Freq: Three times a day (TID) | ORAL | 0 refills | Status: DC | PRN

## 2017-12-29 NOTE — Patient Instructions (Signed)
BPs look okay! Schedule an appointment with Dr. Charlett Blake in 2-3 weeks to discuss acute issues. Check BPs at home, if in upper 140s/150s okay to double irbesartan.

## 2017-12-29 NOTE — Progress Notes (Signed)
Pre visit review using our clinic review tool, if applicable. No additional management support is needed unless otherwise documented below in the visit note.  Pt here today for BP check. At nurse visit on 12/15/2017 Irbesartan 75mg  daily was added to medication regimen. [facility] had to order BP medication, she has only been on medication for 1 week. Pt to also have labs repeated today.   BP today in  R arm: 148/81 Pulse: 59  Pt reported to last time she had fallen and hit head on 12/14/2017. PCP mentioned to watch for symptoms- she is having headaches, intermittently, not bad enough to take any medication for, has sensitivity to computer sensors/monitors. Has hx of vertigo, she is unsure if it is this or something else. She has out of date meclizine she has been taking.   BP in L arm after 10 minutes: 150/76 Pulse: 61  Per Dr. Charlett Blake- BP okay! Bring patient in for acute issues in 2-3 weeks (PCP out of office next week). Continue checking BPs at home- if running in upper 140s/150s okay to double irbesartan to 150mg .   Pt verbalized understanding. Appt scheduled w/ PCP for 01/14/2018.   Nursing blood pressure check note reviewed. Agree with documention and plan.

## 2017-12-29 NOTE — Progress Notes (Signed)
Nurseblood pressure check note reviewed. Agree with documention and plan. 

## 2018-01-14 ENCOUNTER — Ambulatory Visit (INDEPENDENT_AMBULATORY_CARE_PROVIDER_SITE_OTHER): Payer: Medicare Other | Admitting: Family Medicine

## 2018-01-14 ENCOUNTER — Encounter: Payer: Self-pay | Admitting: Family Medicine

## 2018-01-14 DIAGNOSIS — E559 Vitamin D deficiency, unspecified: Secondary | ICD-10-CM | POA: Diagnosis not present

## 2018-01-14 DIAGNOSIS — M79605 Pain in left leg: Secondary | ICD-10-CM | POA: Diagnosis not present

## 2018-01-14 DIAGNOSIS — I2510 Atherosclerotic heart disease of native coronary artery without angina pectoris: Secondary | ICD-10-CM | POA: Diagnosis not present

## 2018-01-14 DIAGNOSIS — R51 Headache: Secondary | ICD-10-CM | POA: Diagnosis not present

## 2018-01-14 DIAGNOSIS — E7800 Pure hypercholesterolemia, unspecified: Secondary | ICD-10-CM | POA: Diagnosis not present

## 2018-01-14 DIAGNOSIS — I10 Essential (primary) hypertension: Secondary | ICD-10-CM | POA: Diagnosis not present

## 2018-01-14 DIAGNOSIS — J069 Acute upper respiratory infection, unspecified: Secondary | ICD-10-CM | POA: Insufficient documentation

## 2018-01-14 DIAGNOSIS — R519 Headache, unspecified: Secondary | ICD-10-CM

## 2018-01-14 DIAGNOSIS — R42 Dizziness and giddiness: Secondary | ICD-10-CM | POA: Diagnosis not present

## 2018-01-14 NOTE — Assessment & Plan Note (Addendum)
Left knee pain last night but manageable this am. Encouraged moist heat and gentle stretching as tolerated. May try NSAIDs and prescription meds as directed and report if symptoms worsen or seek immediate care. Topical lidoacine, if no improvement consider ortho referral

## 2018-01-14 NOTE — Assessment & Plan Note (Signed)
Take a daily supplement 

## 2018-01-14 NOTE — Progress Notes (Signed)
Subjective:  I acted as a Education administrator for [employer]. Brenda Lara, Brenda Lara   Patient ID: Brenda Lara, female    DOB: 1951/08/10, 67 y.o.   MRN: [id number]  Chief Complaint  Patient presents with  . Follow-up    HPI  Patient is in today for follow up visit. She [place] on April 1 and has had a headache that is manageable but persistent since then. She was in the back of a pick up. She jumped to the ground then lost her balance. Fell backward and hit her head on pavement, only had a mild headache and a knot that day. No N/V/visual changes. It is improving and she is not needing meds to manage. She noted some left leg pain last night but tolerable today. Notes also some mild vertigo since the fall and responding to Antivert. Denies CP/palp/SOB/congestion/fevers/GI or GU c/o. Taking meds as prescribed  Patient Care Team: Mosie Lukes, MD as PCP - General (Family Medicine) Ladell Pier, MD as Consulting Physician (Oncology)   Past Medical History:  Diagnosis Date  . Anemia   . Angina pectoris   . Anxiety   . Cervical cancer screening 01/31/2016   Menarche at 12 Regular and moderate flow No history of abnormal pap in past G1P1, s/p 1 SVD No history of abnormal MGM No concerns today gyn surgeries b/l tubal ligation and b/y oophorectomy  . Colon cancer ([place]) 2010  . Coronary artery disease 2001/2005   S/P stenting to RCA and LAD  . GERD (gastroesophageal reflux disease)   . Hearing loss 08/05/2015  . History of chicken pox   . HTN (hypertension)   . Menopause   . Preventative health care 01/31/2016  . Pure hypercholesterolemia   . Vitamin D deficiency 06/01/2017    Past Surgical History:  Procedure Laterality Date  . APPENDECTOMY  02/21/2009  . [place]   multiple  . BILATERAL OOPHORECTOMY  2010  . CHOLECYSTECTOMY  11/09  . COLON SURGERY  2010   right colectomy  . COLONOSCOPY  01/26/2014   NORMAL   . CORONARY STENT PLACEMENT  2001/2005   RCA  . CYSTECTOMY     left  ovary    Family History  Problem Relation Age of Onset  . Hypertension Mother   . Transient ischemic attack Mother   . Heart attack Mother        age 6  . Heart disease Mother   . Leukemia Mother   . Heart attack Father        14's  . Cancer Father        prostate  . Heart disease Father   . Alcohol abuse Father   . Arthritis Father        mva  . Thyroid disease Daughter   . Coronary artery disease Unknown   . Colon cancer Neg Hx   . Pancreatic cancer Neg Hx   . Rectal cancer Neg Hx   . Stomach cancer Neg Hx     Social History   Socioeconomic History  . Marital status: Married    Spouse name: Not on file  . Number of children: Not on file  . Years of education: Not on file  . Highest education level: Not on file  Occupational History  . Not on file  Social Needs  . Financial resource strain: Not on file  . Food insecurity:    Worry: Not on file    Inability: Not on file  .  Transportation needs:    Medical: Not on file    Non-medical: Not on file  Tobacco Use  . Smoking status: Never Smoker  . Smokeless tobacco: Never Used  Substance and Sexual Activity  . Alcohol use: No    Alcohol/week: 0.5 oz    Types: 1 Standard drinks or equivalent per week  . Drug use: No  . 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 Physical activity:    Days per week: Not on file    Minutes per session: Not on file  . Stress: Not on file  Relationships  . Social connections:    Talks on phone: Not on file    Gets together: Not on file    Attends religious service: Not on file    Active member of club or organization: Not on file    Attends meetings of clubs or organizations: Not on file    Relationship status: Not on file  . Intimate partner violence:    Fear of current or ex partner: Not on file    Emotionally abused: Not on file    Physically abused: Not on  file    Forced sexual activity: Not on file  Other Topics Concern  . Not on file  Social History Narrative  . Not on file    Outpatient Medications Prior to Visit  Medication Sig Dispense Refill  . aspirin 325 MG tablet Take 1 tablet (325 mg total) by mouth daily.    Marland Kitchen atenolol (TENORMIN) 50 MG tablet Take 1 tablet (50 mg total) by mouth 2 (two) times daily. 180 tablet 3  . atorvastatin (LIPITOR) 40 MG tablet Take 1 tablet (40 mg total) by mouth daily. 90 tablet 3  . baclofen (LIORESAL) 10 MG tablet Take 10 mg by mouth daily as needed for muscle spasms.     Marland Kitchen HEMATINIC/FOLIC ACID 017-5 MG TABS TAKE 1 TABLET BY MOUTH EVERY DAY 90 each 3  . irbesartan (AVAPRO) 75 MG tablet Take 1 tablet (75 mg total) by mouth daily. 30 tablet 0  . meclizine (ANTIVERT) 25 MG tablet Take 1 tablet (25 mg total) by mouth 3 (three) times daily as needed for dizziness. 90 tablet 0  . nitroGLYCERIN (NITROSTAT) 0.4 MG SL tablet Place 1 tablet (0.4 mg total) under the tongue every 5 (five) minutes as needed for chest pain. 25 tablet 4  . ranitidine (ZANTAC) 300 MG tablet Take 1 tablet (300 mg total) by mouth at bedtime as needed for heartburn. 30 tablet 3  . topiramate (TOPAMAX) 100 MG tablet Take 100 mg by mouth daily.     . vitamin C (ASCORBIC ACID) 500 MG tablet Take 500 mg by mouth daily.    . Vitamin D, Ergocalciferol, (DRISDOL) 50000 units CAPS capsule Take 1 capsule (50,000 Units total) by mouth every 7 (seven) days. 4 capsule 4   No facility-administered medications prior to visit.     No Known Allergies  Review of Systems  Constitutional: Negative for fever and malaise/fatigue.  HENT: Negative for congestion.   Eyes: Negative for blurred vision.  Respiratory: Negative for shortness of breath.   Cardiovascular: Negative for chest pain, palpitations and leg swelling.  Gastrointestinal: Negative for abdominal pain, blood in stool and nausea.  Genitourinary: Negative for dysuria and frequency.    Musculoskeletal: Negative for falls.  Skin: Negative for rash.  Neurological: Positive for headaches. Negative for  dizziness and loss of consciousness.  Endo/Heme/Allergies: Negative for environmental allergies.  Psychiatric/Behavioral: Negative for depression. The patient is not nervous/anxious.        Objective:    Physical Exam  Constitutional: She is oriented to person, place, and time. She appears well-developed and well-nourished. No distress.  HENT:  Head: Normocephalic and atraumatic.  Nose: Nose normal.  Eyes: Right eye exhibits no discharge. Left eye exhibits no discharge.  Neck: Normal range of motion. Neck supple.  Cardiovascular: Normal rate and regular rhythm.  No murmur heard. Pulmonary/Chest: Effort normal and breath sounds normal.  Abdominal: Soft. Bowel sounds are normal. There is no tenderness.  Musculoskeletal: She exhibits no edema.  Neurological: She is alert and oriented to person, place, and time.  Skin: Skin is warm and dry.  Psychiatric: She has a normal mood and affect.  Nursing note and vitals reviewed.   BP 136/72   Pulse (!) 57   Temp 98 F (36.7 C) (Oral)   Resp 16   Ht 5' 1.02" (1.55 m)   Wt 141 lb 9.6 oz (64.2 kg)   SpO2 100%   BMI 26.73 kg/m  Wt Readings from Last 3 Encounters:  01/14/18 141 lb 9.6 oz (64.2 kg)  11/13/17 139 lb 3.2 oz (63.1 kg)  06/01/17 137 lb (62.1 kg)   BP Readings from Last 3 Encounters:  01/14/18 136/72  12/15/17 (!) 168/96  11/13/17 (!) 157/73     Immunization History  Administered Date(s) Administered  . Influenza Split 06/27/2011  . Influenza-Unspecified 06/17/2017  . Pneumococcal Conjugate-13 06/01/2017  . Tdap 07/02/2011    Health Maintenance  Topic Date Due  . Hepatitis C Screening  01/21/2018 (Originally July 13, 1951)  . MAMMOGRAM  01/29/2018  . INFLUENZA VACCINE  04/15/2018  . PNA vac Low Risk Adult (2 of 2 - PPSV23) 06/01/2018  . COLONOSCOPY  01/27/2019  . TETANUS/TDAP  07/01/2021  . DEXA  SCAN  Completed    Lab Results  Component Value Date   WBC 7.5 06/01/2017   HGB 14.1 06/01/2017   HCT 42.7 06/01/2017   PLT 271.0 06/01/2017   GLUCOSE 82 12/29/2017   CHOL 189 10/30/2017   TRIG 173 (H) 10/30/2017   HDL 56 10/30/2017   LDLCALC 98 10/30/2017   ALT 23 12/29/2017   AST 19 12/29/2017   NA 135 12/29/2017   K 3.9 12/29/2017   CL 104 12/29/2017   CREATININE 0.72 12/29/2017   BUN 14 12/29/2017   CO2 26 12/29/2017   TSH 1.58 06/01/2017   INR 0.95 05/13/2011    Lab Results  Component Value Date   TSH 1.58 06/01/2017   Lab Results  Component Value Date   WBC 7.5 06/01/2017   HGB 14.1 06/01/2017   HCT 42.7 06/01/2017   MCV 89.3 06/01/2017   PLT 271.0 06/01/2017   Lab Results  Component Value Date   NA 135 12/29/2017   K 3.9 12/29/2017   CO2 26 12/29/2017   GLUCOSE 82 12/29/2017   BUN 14 12/29/2017   CREATININE 0.72 12/29/2017   BILITOT 0.5 12/29/2017   ALKPHOS 98 12/29/2017   AST 19 12/29/2017   ALT 23 12/29/2017   PROT 6.7 12/29/2017   ALBUMIN 4.0 12/29/2017   CALCIUM 9.4 12/29/2017   GFR 86.04 12/29/2017   Lab Results  Component Value Date   CHOL 189 10/30/2017   Lab Results  Component Value Date   HDL 56 10/30/2017   Lab Results  Component Value Date   LDLCALC 98  10/30/2017   Lab Results  Component Value Date   TRIG 173 (H) 10/30/2017   Lab Results  Component Value Date   CHOLHDL 3.4 10/30/2017   No results found for: HGBA1C       Assessment & Plan:   Problem List Items Addressed This Visit    HYPERCHOLESTEROLEMIA    Encouraged heart healthy diet, increase exercise, avoid trans fats, consider a krill oil cap daily      Essential hypertension    Well controlled, no changes to meds. Encouraged heart healthy diet such as the DASH diet and exercise as tolerated.       Vertigo    Did flare a little since the head injury but not debilitating and improving      Headache    [place] on [place] 1 and has had a headache that is  manageable but persistent since then. She was in the back of a pick up. She jumped to the ground then lost her balance. Fell backward and hit her head on pavement, only had a mild headache and a knot that day. No N/V/visual changes. Post concussive in nature. She advised to report any worsening.       Vitamin D deficiency    Take a daily supplement      Left leg pain    Left knee pain last night but manageable this am. Encouraged moist heat and gentle stretching as tolerated. May try NSAIDs and prescription meds as directed and report if symptoms worsen or seek immediate care. Topical lidoacine, if no improvement consider ortho referral         I am having Brenda Lara maintain her baclofen, topiramate, vitamin C, nitroGLYCERIN, HEMATINIC/FOLIC ACID, atorvastatin, aspirin, atenolol, ranitidine, irbesartan, Vitamin D (Ergocalciferol), and meclizine.  No orders of the defined types were placed in this encounter.   CMA served as Education administrator during this visit. History, Physical and Plan performed by medical provider. Documentation and orders reviewed and attested to.  Penni Homans, MD

## 2018-01-14 NOTE — Assessment & Plan Note (Signed)
Encouraged heart healthy diet, increase exercise, avoid trans fats, consider a krill oil cap daily 

## 2018-01-14 NOTE — Assessment & Plan Note (Addendum)
Did flare a little since the head injury but not debilitating and improving

## 2018-01-14 NOTE — Assessment & Plan Note (Signed)
[place] on [place] 1 and has had a headache that is manageable but persistent since then. She was in the back of a pick up. She jumped to the ground then lost her balance. Fell backward and hit her head on pavement, only had a mild headache and a knot that day. No N/V/visual changes. Post concussive in nature. She advised to report any worsening.

## 2018-01-14 NOTE — Assessment & Plan Note (Signed)
Well controlled, no changes to meds. Encouraged heart healthy diet such as the DASH diet and exercise as tolerated.  °

## 2018-01-14 NOTE — Patient Instructions (Addendum)
BP<140/90 but >100/60, call for advice and/or drop am dose of Atenolol to 25 mg in am (1/2 tab) Maintain hydration, eat protein every 4-6 hours.  Post-Concussion Syndrome Post-concussion syndrome describes the symptoms that can occur after a head injury. These symptoms can last from weeks to months. What are the causes? It is not clear why some head injuries cause post-concussion syndrome. It can occur whether your head injury was mild or severe and whether you were wearing head protection or not. What are the signs or symptoms?  Memory difficulties.  Dizziness.  Headaches.  Double vision or blurry vision.  Sensitivity to light.  Hearing difficulties.  Depression.  Tiredness.  Weakness.  Difficulty with concentration.  Difficulty sleeping or staying asleep.  Vomiting.  Poor balance or instability on your feet.  Slow reaction time.  Difficulty learning and remembering things you have heard. How is this diagnosed? There is no test to determine whether you have post-concussion syndrome. Your health care provider may order an imaging scan of your brain, such as a CT scan, to check for other problems that may be causing your symptoms (such as a severe injury inside your skull). How is this treated? Usually, these problems disappear over time without medical care. Your health care provider may prescribe medicine to help ease your symptoms. It is important to follow up with a neurologist to evaluate your recovery and address any lingering symptoms or issues. Follow these instructions at home:  Take medicines only as directed by your health care provider. Do not take aspirin. Aspirin can slow blood clotting.  Sleep with your head slightly elevated to help with headaches.  Avoid any situation where there is potential for another head injury. This includes football, hockey, soccer, basketball, martial arts, downhill snow sports, and horseback riding. Your condition will get  worse every time you experience a concussion. You should avoid these activities until you are evaluated by the appropriate follow-up health care providers.  Keep all follow-up visits as directed by your health care provider. This is important. Contact a health care provider if:  You have increased problems paying attention or concentrating.  You have increased difficulty remembering or learning new information.  You need more time to complete tasks or assignments than before.  You have increased irritability or decreased ability to cope with stress.  You have more symptoms than before. Seek medical care if you have any of the following symptoms for more than two weeks after your injury:  Lasting (chronic) headaches.  Dizziness or balance problems.  Nausea.  Vision problems.  Increased sensitivity to noise or light.  Depression or mood swings.  Anxiety or irritability.  Memory problems.  Difficulty concentrating or paying attention.  Sleep problems.  Feeling tired all the time.  Get help right away if:  You have confusion or unusual drowsiness.  Others find it difficult to wake you up.  You have nausea or persistent, forceful vomiting.  You feel like you are moving when you are not (vertigo). Your eyes may move rapidly back and forth.  You have convulsions or faint.  You have severe, persistent headaches that are not relieved by medicine.  You cannot use your arms or legs normally.  One of your pupils is larger than the other.  You have clear or bloody discharge from your nose or ears.  Your problems are getting worse, not better. This information is not intended to replace advice given to you by your health care provider. Make sure you discuss  any questions you have with your health care provider. Document Released: 02/21/2002 Document Revised: 03/21/2016 Document Reviewed: 12/07/2013 Elsevier Interactive Patient Education  2018 [employer].

## 2018-01-18 ENCOUNTER — Other Ambulatory Visit: Payer: Self-pay | Admitting: Family Medicine

## 2018-01-20 ENCOUNTER — Other Ambulatory Visit: Payer: Self-pay | Admitting: Family Medicine

## 2018-01-20 ENCOUNTER — Other Ambulatory Visit: Payer: Medicare Other | Admitting: *Deleted

## 2018-01-20 DIAGNOSIS — E7800 Pure hypercholesterolemia, unspecified: Secondary | ICD-10-CM | POA: Diagnosis not present

## 2018-01-20 DIAGNOSIS — Z79899 Other long term (current) drug therapy: Secondary | ICD-10-CM

## 2018-01-20 LAB — LIPID PANEL
CHOL/HDL RATIO: 2 ratio (ref 0.0–4.4)
CHOLESTEROL TOTAL: 119 mg/dL (ref 100–199)
HDL: 61 mg/dL (ref >39)
LDL Calculated: 43 mg/dL (ref 0–99)
TRIGLYCERIDES: 77 mg/dL (ref 0–149)
VLDL Cholesterol Cal: 15 mg/dL (ref 5–40)

## 2018-01-20 LAB — HEPATIC FUNCTION PANEL
ALK PHOS: 96 IU/L (ref 39–117)
ALT: 22 IU/L (ref 0–32)
AST: 21 IU/L (ref 0–40)
Albumin: 4.3 g/dL (ref 3.6–4.8)
BILIRUBIN TOTAL: 0.5 mg/dL (ref 0.0–1.2)
BILIRUBIN, DIRECT: 0.18 mg/dL (ref 0.00–0.40)
Total Protein: 6.2 g/dL (ref 6.0–8.5)

## 2018-01-20 MED ORDER — IRBESARTAN 75 MG PO TABS: 75.0000 mg | ORAL_TABLET | Freq: Every day | ORAL | 5 refills | Status: DC

## 2018-02-22 ENCOUNTER — Other Ambulatory Visit: Payer: Self-pay | Admitting: Family Medicine

## 2018-03-03 ENCOUNTER — Other Ambulatory Visit: Payer: Self-pay | Admitting: Family Medicine

## 2018-03-03 DIAGNOSIS — Z1231 Encounter for screening mammogram for malignant neoplasm of breast: Secondary | ICD-10-CM

## 2018-03-05 ENCOUNTER — Ambulatory Visit (HOSPITAL_BASED_OUTPATIENT_CLINIC_OR_DEPARTMENT_OTHER)
Admission: RE | Admit: 2018-03-05 | Discharge: 2018-03-05 | Disposition: A | Discharge status: Home / Self Care | Payer: Medicare Other | Source: Ambulatory Visit | Attending: Family Medicine | Admitting: Family Medicine

## 2018-03-05 DIAGNOSIS — Z1231 Encounter for screening mammogram for malignant neoplasm of breast: Secondary | ICD-10-CM | POA: Diagnosis not present

## 2018-03-15 ENCOUNTER — Ambulatory Visit (INDEPENDENT_AMBULATORY_CARE_PROVIDER_SITE_OTHER): Payer: Medicare Other | Admitting: Family Medicine

## 2018-03-15 ENCOUNTER — Encounter: Payer: Self-pay | Admitting: Family Medicine

## 2018-03-15 DIAGNOSIS — D649 Anemia, unspecified: Secondary | ICD-10-CM

## 2018-03-15 DIAGNOSIS — E7800 Pure hypercholesterolemia, unspecified: Secondary | ICD-10-CM | POA: Diagnosis not present

## 2018-03-15 DIAGNOSIS — E559 Vitamin D deficiency, unspecified: Secondary | ICD-10-CM | POA: Diagnosis not present

## 2018-03-15 DIAGNOSIS — I10 Essential (primary) hypertension: Secondary | ICD-10-CM | POA: Diagnosis not present

## 2018-03-15 DIAGNOSIS — I2510 Atherosclerotic heart disease of native coronary artery without angina pectoris: Secondary | ICD-10-CM | POA: Diagnosis not present

## 2018-03-15 MED ORDER — VITAMIN D (ERGOCALCIFEROL) 1.25 MG (50000 UNIT) PO CAPS: 50000.0000 [IU] | ORAL_CAPSULE | ORAL | 5 refills | Status: DC

## 2018-03-15 MED ORDER — FERROUS FUMARATE-FOLIC ACID 324-1 MG PO TABS: 1.0000 | ORAL_TABLET | Freq: Every day | ORAL | 3 refills | Status: DC

## 2018-03-15 NOTE — Progress Notes (Signed)
Subjective:  I acted as a Education administrator for Dr. Charlett Blake. [place], [place]  Patient ID: Brenda Lara, female    DOB: 06-06-1951, 67 y.o.   MRN: [id number]  No chief complaint on file.   HPI  Patient is in today for a follow up visit for HTN, and other medical concerns. Patient has no acute concerns. No recent febrile illness or acute hospitalizations. Denies CP/palp/SOB/HA/congestion/fevers/GI or GU c/o. Taking meds as prescribed. No poolyrua or polydipsia. Tries to stay active and eat a heart healthy diet.    Patient Care Team: Mosie Lukes, MD as PCP - General (Family Medicine) Ladell Pier, MD as Consulting Physician (Oncology)   Past Medical History:  Diagnosis Date  . Anemia   . Angina pectoris   . Anxiety   . Cervical cancer screening 01/31/2016   Menarche at 12 Regular and moderate flow No history of abnormal pap in past G1P1, s/p 1 SVD No history of abnormal MGM No concerns today gyn surgeries b/l tubal ligation and b/y oophorectomy  . Colon cancer ([place]) 2010  . Coronary artery disease 2001/2005   S/P stenting to RCA and LAD  . GERD (gastroesophageal reflux disease)   . Hearing loss 08/05/2015  . History of chicken pox   . HTN (hypertension)   . Menopause   . Preventative health care 01/31/2016  . Pure hypercholesterolemia   . Vitamin D deficiency 06/01/2017    Past Surgical History:  Procedure Laterality Date  . APPENDECTOMY  02/21/2009  . [place]   multiple  . BILATERAL OOPHORECTOMY  2010  . CHOLECYSTECTOMY  11/09  . COLON SURGERY  2010   right colectomy  . COLONOSCOPY  01/26/2014   NORMAL   . CORONARY STENT PLACEMENT  2001/2005   RCA  . CYSTECTOMY     left ovary    Family History  Problem Relation Age of Onset  . Hypertension Mother   . Transient ischemic attack Mother   . Heart attack Mother        age 77  . Heart disease Mother   . Leukemia Mother   . Heart attack Father        7's  . Cancer Father        prostate  . Heart  disease Father   . Alcohol abuse Father   . Arthritis Father        mva  . Thyroid disease Daughter   . Coronary artery disease Unknown   . Colon cancer Neg Hx   . Pancreatic cancer Neg Hx   . Rectal cancer Neg Hx   . Stomach cancer Neg Hx     Social History   Socioeconomic History  . Marital status: Married    Spouse name: Not on file  . Number of children: Not on file  . Years of education: Not on file  . Highest education level: Not on file  Occupational History  . Not on file  Social Needs  . Financial resource strain: Not on file  . Food insecurity:    Worry: Not on file    Inability: Not on file  . Transportation needs:    Medical: Not on file    Non-medical: Not on file  Tobacco Use  . Smoking status: Never Smoker  . Smokeless tobacco: Never Used  Substance and Sexual Activity  . Alcohol use: No    Alcohol/week: 0.6 oz    Types: 1 Standard drinks or equivalent per week  .  Drug use: No  . 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 Physical activity:    Days per week: Not on file    Minutes per session: Not on file  . Stress: Not on file  Relationships  . Social connections:    Talks on phone: Not on file    Gets together: Not on file    Attends religious service: Not on file    Active member of club or organization: Not on file    Attends meetings of clubs or organizations: Not on file    Relationship status: Not on file  . Intimate partner violence:    Fear of current or ex partner: Not on file    Emotionally abused: Not on file    Physically abused: Not on file    Forced sexual activity: Not on file  Other Topics Concern  . Not on file  Social History Narrative  . Not on file    Outpatient Medications Prior to Visit  Medication Sig Dispense Refill  . aspirin 325 MG tablet Take 1 tablet (325 mg total) by mouth daily.    Marland Kitchen atenolol  (TENORMIN) 50 MG tablet Take 1 tablet (50 mg total) by mouth 2 (two) times daily. 180 tablet 3  . atorvastatin (LIPITOR) 40 MG tablet Take 1 tablet (40 mg total) by mouth daily. 90 tablet 3  . baclofen (LIORESAL) 10 MG tablet Take 10 mg by mouth daily as needed for muscle spasms.     . irbesartan (AVAPRO) 75 MG tablet Take 1 tablet (75 mg total) by mouth daily. 30 tablet 5  . meclizine (ANTIVERT) 25 MG tablet Take 1 tablet (25 mg total) by mouth 3 (three) times daily as needed for dizziness. 90 tablet 0  . nitroGLYCERIN (NITROSTAT) 0.4 MG SL tablet Place 1 tablet (0.4 mg total) under the tongue every 5 (five) minutes as needed for chest pain. 25 tablet 4  . ranitidine (ZANTAC) 300 MG tablet Take 1 tablet (300 mg total) by mouth at bedtime as needed for heartburn. 30 tablet 3  . topiramate (TOPAMAX) 100 MG tablet Take 100 mg by mouth daily.     . vitamin C (ASCORBIC ACID) 500 MG tablet Take 500 mg by mouth daily.    Marland Kitchen HEMATINIC/FOLIC ACID 867-6 MG TABS TAKE 1 TABLET BY MOUTH EVERY DAY 90 each 3  . Vitamin D, Ergocalciferol, (DRISDOL) 50000 units CAPS capsule TAKE 1 CAPSULE (50,000 UNITS TOTAL) BY MOUTH EVERY 7 (SEVEN) DAYS. 4 capsule 2   No facility-administered medications prior to visit.     No Known Allergies  Review of Systems  Constitutional: Negative for fever and malaise/fatigue.  HENT: Negative for congestion.   Eyes: Negative for blurred vision.  Respiratory: Negative for shortness of breath.   Cardiovascular: Negative for chest pain, palpitations and leg swelling.  Gastrointestinal: Negative for abdominal pain, blood in stool and nausea.  Genitourinary: Negative for dysuria and frequency.  Musculoskeletal: Negative for falls.  Skin: Negative for rash.  Neurological: Negative for dizziness, loss of consciousness and headaches.  Endo/Heme/Allergies: Negative for environmental allergies.  Psychiatric/Behavioral: Negative for depression. The patient is not nervous/anxious.          Objective:    Physical Exam  Constitutional: She is oriented to person, place, and time. She appears well-developed and well-nourished. No distress.  HENT:  Head: Normocephalic and atraumatic.  Nose: Nose normal.  Eyes: Right eye exhibits no discharge. Left eye exhibits no discharge.  Neck: Normal range of motion. Neck supple.  Cardiovascular: Normal rate and regular rhythm.  No murmur heard. Pulmonary/Chest: Effort normal and breath sounds normal.  Abdominal: Soft. Bowel sounds are normal. There is no tenderness.  Musculoskeletal: She exhibits no edema.  Neurological: She is alert and oriented to person, place, and time.  Skin: Skin is warm and dry.  Psychiatric: She has a normal mood and affect.  Nursing note and vitals reviewed.   BP (!) 130/58 (BP Location: Left Arm, Patient Position: Sitting, Cuff Size: Normal)   Pulse (!) 56   Temp 97.7 F (36.5 C) (Oral)   Resp 18   Ht 5\' 1"  (1.549 m)   Wt 144 lb 9.6 oz (65.6 kg)   SpO2 98%   BMI 27.32 kg/m  Wt Readings from Last 3 Encounters:  03/15/18 144 lb 9.6 oz (65.6 kg)  01/14/18 141 lb 9.6 oz (64.2 kg)  11/13/17 139 lb 3.2 oz (63.1 kg)   BP Readings from Last 3 Encounters:  03/15/18 (!) 130/58  01/14/18 136/72  12/15/17 (!) 168/96     Immunization History  Administered Date(s) Administered  . Influenza Split 06/27/2011  . Influenza-Unspecified 06/17/2017  . Pneumococcal Conjugate-13 06/01/2017  . Tdap 07/02/2011    Health Maintenance  Topic Date Due  . Hepatitis C Screening  1951-04-06  . INFLUENZA VACCINE  04/15/2018  . PNA vac Low Risk Adult (2 of 2 - PPSV23) 06/01/2018  . COLONOSCOPY  01/27/2019  . MAMMOGRAM  03/06/2019  . TETANUS/TDAP  07/01/2021  . DEXA SCAN  Completed    Lab Results  Component Value Date   WBC 7.5 06/01/2017   HGB 14.1 06/01/2017   HCT 42.7 06/01/2017   PLT 271.0 06/01/2017   GLUCOSE 82 12/29/2017   CHOL 119 01/20/2018   TRIG 77 01/20/2018   HDL 61 01/20/2018   LDLCALC 43  01/20/2018   ALT 22 01/20/2018   AST 21 01/20/2018   NA 135 12/29/2017   K 3.9 12/29/2017   CL 104 12/29/2017   CREATININE 0.72 12/29/2017   BUN 14 12/29/2017   CO2 26 12/29/2017   TSH 1.58 06/01/2017   INR 0.95 05/13/2011    Lab Results  Component Value Date   TSH 1.58 06/01/2017   Lab Results  Component Value Date   WBC 7.5 06/01/2017   HGB 14.1 06/01/2017   HCT 42.7 06/01/2017   MCV 89.3 06/01/2017   PLT 271.0 06/01/2017   Lab Results  Component Value Date   NA 135 12/29/2017   K 3.9 12/29/2017   CO2 26 12/29/2017   GLUCOSE 82 12/29/2017   BUN 14 12/29/2017   CREATININE 0.72 12/29/2017   BILITOT 0.5 01/20/2018   ALKPHOS 96 01/20/2018   AST 21 01/20/2018   ALT 22 01/20/2018   PROT 6.2 01/20/2018   ALBUMIN 4.3 01/20/2018   CALCIUM 9.4 12/29/2017   GFR 86.04 12/29/2017   Lab Results  Component Value Date   CHOL 119 01/20/2018   Lab Results  Component Value Date   HDL 61 01/20/2018   Lab Results  Component Value Date   LDLCALC 43 01/20/2018   Lab Results  Component Value Date   TRIG 77 01/20/2018   Lab Results  Component Value Date   CHOLHDL 2.0 01/20/2018   No results found for: HGBA1C       Assessment & Plan:   Problem List Items Addressed This Visit  HYPERCHOLESTEROLEMIA    Encouraged heart healthy diet, increase exercise, avoid trans fats, consider a krill oil cap daily. Tolerating Atorvastatin      Essential hypertension    Well controlled, no changes to meds. Encouraged heart healthy diet such as the DASH diet and exercise as tolerated.       Anemia    Increase leafy greens, consider increased lean red meat and using cast iron cookware. Continue to monitor, report any concerns      Relevant Medications   Ferrous Fumarate-Folic Acid (HEMATINIC/FOLIC ACID) 563-8 MG TABS   Vitamin D deficiency    Needs daily supplementation         I have changed Hassan Rowan A. Jarriel's HEMATINIC/FOLIC ACID to Ferrous Fumarate-Folic Acid. I am  also having her maintain her baclofen, topiramate, vitamin C, nitroGLYCERIN, atorvastatin, aspirin, atenolol, ranitidine, meclizine, irbesartan, and Vitamin D (Ergocalciferol).  Meds ordered this encounter  Medications  . Ferrous Fumarate-Folic Acid (HEMATINIC/FOLIC ACID) 756-4 MG TABS    Sig: Take 1 tablet by mouth daily.    Dispense:  90 each    Refill:  3  . Vitamin D, Ergocalciferol, (DRISDOL) 50000 units CAPS capsule    Sig: Take 1 capsule (50,000 Units total) by mouth every 7 (seven) days.    Dispense:  4 capsule    Refill:  5    CMA served as scribe during this visit. History, Physical and Plan performed by medical provider. Documentation and orders reviewed and attested to.  Penni Homans, MD

## 2018-03-15 NOTE — Patient Instructions (Signed)
Tetanus was in 2012, next one is scheduled for 2022 but if you suffer an injury and they offer a booster take it  Shingrix is the new shingles, 2 shots over 2-6 months at pharmacy Hypertension Hypertension, commonly called high blood pressure, is when the force of blood pumping through the arteries is too strong. The arteries are the blood vessels that carry blood from the heart throughout the body. Hypertension forces the heart to work harder to pump blood and may cause arteries to become narrow or stiff. Having untreated or uncontrolled hypertension can cause heart attacks, strokes, kidney disease, and other problems. A blood pressure reading consists of a higher number over a lower number. Ideally, your blood pressure should be below 120/80. The first ("top") number is called the systolic pressure. It is a measure of the pressure in your arteries as your heart beats. The second ("bottom") number is called the diastolic pressure. It is a measure of the pressure in your arteries as the heart relaxes. What are the causes? The cause of this condition is not known. What increases the risk? Some risk factors for high blood pressure are under your control. Others are not. Factors you can change  Smoking.  Having type 2 diabetes mellitus, high cholesterol, or both.  Not getting enough exercise or physical activity.  Being overweight.  Having too much fat, sugar, calories, or salt (sodium) in your diet.  Drinking too much alcohol. Factors that are difficult or impossible to change  Having chronic kidney disease.  Having a family history of high blood pressure.  Age. Risk increases with age.  Race. You may be at higher risk if you are African-American.  Gender. Men are at higher risk than women before age 63. After age 37, women are at higher risk than men.  Having obstructive sleep apnea.  Stress. What are the signs or symptoms? Extremely high blood pressure (hypertensive crisis) may  cause:  Headache.  Anxiety.  Shortness of breath.  Nosebleed.  Nausea and vomiting.  Severe chest pain.  Jerky movements you cannot control (seizures).  How is this diagnosed? This condition is diagnosed by measuring your blood pressure while you are seated, with your arm resting on a surface. The cuff of the blood pressure monitor will be placed directly against the skin of your upper arm at the level of your heart. It should be measured at least twice using the same arm. Certain conditions can cause a difference in blood pressure between your right and left arms. Certain factors can cause blood pressure readings to be lower or higher than normal (elevated) for a short period of time:  When your blood pressure is higher when you are in a health care provider's office than when you are at home, this is called white coat hypertension. Most people with this condition do not need medicines.  When your blood pressure is higher at home than when you are in a health care provider's office, this is called masked hypertension. Most people with this condition may need medicines to control blood pressure.  If you have a high blood pressure reading during one visit or you have normal blood pressure with other risk factors:  You may be asked to return on a different day to have your blood pressure checked again.  You may be asked to monitor your blood pressure at home for 1 week or longer.  If you are diagnosed with hypertension, you may have other blood or imaging tests to help your  health care provider understand your overall risk for other conditions. How is this treated? This condition is treated by making healthy lifestyle changes, such as eating healthy foods, exercising more, and reducing your alcohol intake. Your health care provider may prescribe medicine if lifestyle changes are not enough to get your blood pressure under control, and if:  Your systolic blood pressure is above  130.  Your diastolic blood pressure is above 80.  Your personal target blood pressure may vary depending on your medical conditions, your age, and other factors. Follow these instructions at home: Eating and drinking  Eat a diet that is high in fiber and potassium, and low in sodium, added sugar, and fat. An example eating plan is called the DASH (Dietary Approaches to Stop Hypertension) diet. To eat this way: ? Eat plenty of fresh fruits and vegetables. Try to fill half of your plate at each meal with fruits and vegetables. ? Eat whole grains, such as whole wheat pasta, brown rice, or whole grain bread. Fill about one quarter of your plate with whole grains. ? Eat or drink low-fat dairy products, such as skim milk or low-fat yogurt. ? Avoid fatty cuts of meat, processed or cured meats, and poultry with skin. Fill about one quarter of your plate with lean proteins, such as fish, chicken without skin, beans, eggs, and tofu. ? Avoid premade and processed foods. These tend to be higher in sodium, added sugar, and fat.  Reduce your daily sodium intake. Most people with hypertension should eat less than 1,500 mg of sodium a day.  Limit alcohol intake to no more than 1 drink a day for nonpregnant women and 2 drinks a day for men. One drink equals 12 oz of beer, 5 oz of wine, or 1 oz of hard liquor. Lifestyle  Work with your health care provider to maintain a healthy body weight or to lose weight. Ask what an ideal weight is for you.  Get at least 30 minutes of exercise that causes your heart to beat faster (aerobic exercise) most days of the week. Activities may include walking, swimming, or biking.  Include exercise to strengthen your muscles (resistance exercise), such as pilates or lifting weights, as part of your weekly exercise routine. Try to do these types of exercises for 30 minutes at least 3 days a week.  Do not use any products that contain nicotine or tobacco, such as cigarettes and  e-cigarettes. If you need help quitting, ask your health care provider.  Monitor your blood pressure at home as told by your health care provider.  Keep all follow-up visits as told by your health care provider. This is important. Medicines  Take over-the-counter and prescription medicines only as told by your health care provider. Follow directions carefully. Blood pressure medicines must be taken as prescribed.  Do not skip doses of blood pressure medicine. Doing this puts you at risk for problems and can make the medicine less effective.  Ask your health care provider about side effects or reactions to medicines that you should watch for. Contact a health care provider if:  You think you are having a reaction to a medicine you are taking.  You have headaches that keep coming back (recurring).  You feel dizzy.  You have swelling in your ankles.  You have trouble with your vision. Get help right away if:  You develop a severe headache or confusion.  You have unusual weakness or numbness.  You feel faint.  You have severe  pain in your chest or abdomen.  You vomit repeatedly.  You have trouble breathing. Summary  Hypertension is when the force of blood pumping through your arteries is too strong. If this condition is not controlled, it may put you at risk for serious complications.  Your personal target blood pressure may vary depending on your medical conditions, your age, and other factors. For most people, a normal blood pressure is less than 120/80.  Hypertension is treated with lifestyle changes, medicines, or a combination of both. Lifestyle changes include weight loss, eating a healthy, low-sodium diet, exercising more, and limiting alcohol. This information is not intended to replace advice given to you by your health care provider. Make sure you discuss any questions you have with your health care provider. Document Released: 09/01/2005 Document Revised: 07/30/2016  Document Reviewed: 07/30/2016 Elsevier Interactive Patient Education  [employer].

## 2018-03-21 NOTE — Assessment & Plan Note (Signed)
Needs daily supplementation

## 2018-03-21 NOTE — Assessment & Plan Note (Signed)
Increase leafy greens, consider increased lean red meat and using cast iron cookware. Continue to monitor, report any concerns 

## 2018-03-21 NOTE — Assessment & Plan Note (Addendum)
Encouraged heart healthy diet, increase exercise, avoid trans fats, consider a krill oil cap daily. Tolerating Atorvastatin 

## 2018-03-21 NOTE — Assessment & Plan Note (Signed)
Well controlled, no changes to meds. Encouraged heart healthy diet such as the DASH diet and exercise as tolerated.  °

## 2018-04-06 DIAGNOSIS — H2513 Age-related nuclear cataract, bilateral: Secondary | ICD-10-CM | POA: Diagnosis not present

## 2018-04-21 NOTE — Progress Notes (Signed)
Patient ID: Brenda Lara, female   DOB: August 26, 1951, 67 y.o.   MRN: [id number]   68 y.o. with distant history of CAD. Stenting of RCA in 2006 and LAD in 2005. Last cath in 2010 stents widely patent Last ETT in 2016 was normal. Statin changed to Lipitor in February with target LDL reached. Colon cancer in remission post colectomy EF is 55% with some apical hypokinesis but has not had echo in long time.   No angina Compliant with meds   Needs new nitro Anginal equivalent is inner arm pain She is walking 71miles.day and [facility]  ROS: Denies fever, malais, weight loss, blurry vision, decreased visual acuity, cough, sputum, SOB, hemoptysis, pleuritic pain, palpitaitons, heartburn, abdominal pain, melena, lower extremity edema, claudication, or rash.  All other systems reviewed and negative  General: BP (!) 148/78   Pulse 66   Ht 5\' 1"  (1.549 m)   Wt 146 lb (66.2 kg)   SpO2 99%   BMI 27.59 kg/m  Affect appropriate Healthy:  appears stated age 35: normal Neck supple with no adenopathy JVP normal no bruits no thyromegaly Lungs clear with no wheezing and good diaphragmatic motion Heart:  S1/S2 no murmur, no rub, gallop or click PMI normal Abdomen: benighn, BS positve, no tenderness, no AAA post colectomy  no bruit.  No HSM or HJR Distal pulses intact with no bruits No edema Neuro non-focal Skin warm and dry No muscular weakness   Current Outpatient Medications  Medication Sig Dispense Refill  . aspirin 325 MG tablet Take 1 tablet (325 mg total) by mouth daily.    Marland Kitchen atenolol (TENORMIN) 50 MG tablet Take 1 tablet (50 mg total) by mouth 2 (two) times daily. 180 tablet 3  . atorvastatin (LIPITOR) 40 MG tablet Take 1 tablet (40 mg total) by mouth daily. 90 tablet 3  . baclofen (LIORESAL) 10 MG tablet Take 10 mg by mouth daily as needed for muscle spasms.     . Ferrous Fumarate-Folic Acid (HEMATINIC/FOLIC ACID) 409-8 MG TABS Take 1 tablet by mouth daily. 90  each 3  . irbesartan (AVAPRO) 75 MG tablet Take 1 tablet (75 mg total) by mouth daily. 30 tablet 5  . meclizine (ANTIVERT) 25 MG tablet Take 1 tablet (25 mg total) by mouth 3 (three) times daily as needed for dizziness. 90 tablet 0  . nitroGLYCERIN (NITROSTAT) 0.4 MG SL tablet Place 1 tablet (0.4 mg total) under the tongue every 5 (five) minutes as needed for chest pain. 25 tablet 4  . ranitidine (ZANTAC) 300 MG tablet Take 1 tablet (300 mg total) by mouth at bedtime as needed for heartburn. 30 tablet 3  . topiramate (TOPAMAX) 100 MG tablet Take 100 mg by mouth daily.     . vitamin C (ASCORBIC ACID) 500 MG tablet Take 500 mg by mouth daily.     No current facility-administered medications for this visit.     Allergies  Patient has no known allergies.  Electrocardiogram:  01/20/14  NSR rate 67 normal ECG  01/24/15 SR rate 57  Normal ECG   02/05/16  SR rate 59 normal   Assessment and Plan  CAD:  Distant history of RCA/LAD stenting  Patent by cath in 2010.  No arm/chest pain normal ECG continue ASA and beta blocker Normal ETT  June 2016 Active no need for testing at this time  New SL nitro called in   Chol:  Changed to Lipitor 10/2017 with target LDL reached  Cholesterol  is at goal.  Continue current dose of statin and diet Rx.  No myalgias or side effects.  F/U  LFT's in 6 months. Lab Results  Component Value Date   LDLCALC 43 01/20/2018    Colon Cancer:  Colonoscopy normal last 2015  in remission fu GI GERD:  Continue zantac as needed Vertigo:  Infrequent has antivert as needed    [employer]

## 2018-04-22 ENCOUNTER — Ambulatory Visit (INDEPENDENT_AMBULATORY_CARE_PROVIDER_SITE_OTHER): Payer: Medicare Other | Admitting: Cardiovascular Disease

## 2018-04-22 ENCOUNTER — Encounter: Payer: Self-pay | Admitting: Cardiovascular Disease

## 2018-04-22 VITALS — BP 148/78 | HR 66 | Ht 61.0 in | Wt 146.0 lb

## 2018-04-22 DIAGNOSIS — I2510 Atherosclerotic heart disease of native coronary artery without angina pectoris: Secondary | ICD-10-CM

## 2018-04-22 MED ORDER — NITROGLYCERIN 0.4 MG SL SUBL: 0.4000 mg | SUBLINGUAL_TABLET | SUBLINGUAL | 4 refills | Status: DC | PRN

## 2018-04-22 NOTE — Addendum Note (Signed)
Addended by: Aris Georgia, Jakai Onofre L on: 04/22/2018 08:47 AM   Modules accepted: Orders

## 2018-04-22 NOTE — Patient Instructions (Signed)

## 2018-05-03 DIAGNOSIS — G43719 Chronic migraine without aura, intractable, without status migrainosus: Secondary | ICD-10-CM | POA: Diagnosis not present

## 2018-05-03 DIAGNOSIS — G43019 Migraine without aura, intractable, without status migrainosus: Secondary | ICD-10-CM | POA: Diagnosis not present

## 2018-05-12 ENCOUNTER — Other Ambulatory Visit: Payer: Self-pay | Admitting: Family Medicine

## 2018-05-20 DIAGNOSIS — M5412 Radiculopathy, cervical region: Secondary | ICD-10-CM | POA: Diagnosis not present

## 2018-05-20 DIAGNOSIS — M5030 Other cervical disc degeneration, unspecified cervical region: Secondary | ICD-10-CM | POA: Diagnosis not present

## 2018-05-25 DIAGNOSIS — M6281 Muscle weakness (generalized): Secondary | ICD-10-CM | POA: Diagnosis not present

## 2018-05-25 DIAGNOSIS — M25611 Stiffness of right shoulder, not elsewhere classified: Secondary | ICD-10-CM | POA: Diagnosis not present

## 2018-05-25 DIAGNOSIS — M25612 Stiffness of left shoulder, not elsewhere classified: Secondary | ICD-10-CM | POA: Diagnosis not present

## 2018-05-25 DIAGNOSIS — R293 Abnormal posture: Secondary | ICD-10-CM | POA: Diagnosis not present

## 2018-05-25 DIAGNOSIS — R51 Headache: Secondary | ICD-10-CM | POA: Diagnosis not present

## 2018-05-25 DIAGNOSIS — M5030 Other cervical disc degeneration, unspecified cervical region: Secondary | ICD-10-CM | POA: Diagnosis not present

## 2018-05-25 DIAGNOSIS — M79601 Pain in right arm: Secondary | ICD-10-CM | POA: Diagnosis not present

## 2018-05-25 DIAGNOSIS — M79602 Pain in left arm: Secondary | ICD-10-CM | POA: Diagnosis not present

## 2018-05-25 DIAGNOSIS — M2560 Stiffness of unspecified joint, not elsewhere classified: Secondary | ICD-10-CM | POA: Diagnosis not present

## 2018-06-02 DIAGNOSIS — M79602 Pain in left arm: Secondary | ICD-10-CM | POA: Diagnosis not present

## 2018-06-02 DIAGNOSIS — R51 Headache: Secondary | ICD-10-CM | POA: Diagnosis not present

## 2018-06-02 DIAGNOSIS — M2560 Stiffness of unspecified joint, not elsewhere classified: Secondary | ICD-10-CM | POA: Diagnosis not present

## 2018-06-02 DIAGNOSIS — M79601 Pain in right arm: Secondary | ICD-10-CM | POA: Diagnosis not present

## 2018-06-02 DIAGNOSIS — M6281 Muscle weakness (generalized): Secondary | ICD-10-CM | POA: Diagnosis not present

## 2018-06-02 DIAGNOSIS — M5030 Other cervical disc degeneration, unspecified cervical region: Secondary | ICD-10-CM | POA: Diagnosis not present

## 2018-06-04 DIAGNOSIS — M79602 Pain in left arm: Secondary | ICD-10-CM | POA: Diagnosis not present

## 2018-06-04 DIAGNOSIS — M2560 Stiffness of unspecified joint, not elsewhere classified: Secondary | ICD-10-CM | POA: Diagnosis not present

## 2018-06-04 DIAGNOSIS — M79601 Pain in right arm: Secondary | ICD-10-CM | POA: Diagnosis not present

## 2018-06-04 DIAGNOSIS — R51 Headache: Secondary | ICD-10-CM | POA: Diagnosis not present

## 2018-06-04 DIAGNOSIS — M5030 Other cervical disc degeneration, unspecified cervical region: Secondary | ICD-10-CM | POA: Diagnosis not present

## 2018-06-04 DIAGNOSIS — M6281 Muscle weakness (generalized): Secondary | ICD-10-CM | POA: Diagnosis not present

## 2018-06-05 ENCOUNTER — Other Ambulatory Visit: Payer: Self-pay | Admitting: Family Medicine

## 2018-06-09 DIAGNOSIS — M79602 Pain in left arm: Secondary | ICD-10-CM | POA: Diagnosis not present

## 2018-06-09 DIAGNOSIS — R51 Headache: Secondary | ICD-10-CM | POA: Diagnosis not present

## 2018-06-09 DIAGNOSIS — M79601 Pain in right arm: Secondary | ICD-10-CM | POA: Diagnosis not present

## 2018-06-09 DIAGNOSIS — M2560 Stiffness of unspecified joint, not elsewhere classified: Secondary | ICD-10-CM | POA: Diagnosis not present

## 2018-06-09 DIAGNOSIS — M5030 Other cervical disc degeneration, unspecified cervical region: Secondary | ICD-10-CM | POA: Diagnosis not present

## 2018-06-09 DIAGNOSIS — M6281 Muscle weakness (generalized): Secondary | ICD-10-CM | POA: Diagnosis not present

## 2018-06-11 DIAGNOSIS — M2560 Stiffness of unspecified joint, not elsewhere classified: Secondary | ICD-10-CM | POA: Diagnosis not present

## 2018-06-11 DIAGNOSIS — M79601 Pain in right arm: Secondary | ICD-10-CM | POA: Diagnosis not present

## 2018-06-11 DIAGNOSIS — M5030 Other cervical disc degeneration, unspecified cervical region: Secondary | ICD-10-CM | POA: Diagnosis not present

## 2018-06-11 DIAGNOSIS — R51 Headache: Secondary | ICD-10-CM | POA: Diagnosis not present

## 2018-06-11 DIAGNOSIS — M79602 Pain in left arm: Secondary | ICD-10-CM | POA: Diagnosis not present

## 2018-06-11 DIAGNOSIS — M6281 Muscle weakness (generalized): Secondary | ICD-10-CM | POA: Diagnosis not present

## 2018-06-14 DIAGNOSIS — R51 Headache: Secondary | ICD-10-CM | POA: Diagnosis not present

## 2018-06-14 DIAGNOSIS — M79602 Pain in left arm: Secondary | ICD-10-CM | POA: Diagnosis not present

## 2018-06-14 DIAGNOSIS — M2560 Stiffness of unspecified joint, not elsewhere classified: Secondary | ICD-10-CM | POA: Diagnosis not present

## 2018-06-14 DIAGNOSIS — M79601 Pain in right arm: Secondary | ICD-10-CM | POA: Diagnosis not present

## 2018-06-14 DIAGNOSIS — M6281 Muscle weakness (generalized): Secondary | ICD-10-CM | POA: Diagnosis not present

## 2018-06-14 DIAGNOSIS — M5030 Other cervical disc degeneration, unspecified cervical region: Secondary | ICD-10-CM | POA: Diagnosis not present

## 2018-06-17 ENCOUNTER — Ambulatory Visit (INDEPENDENT_AMBULATORY_CARE_PROVIDER_SITE_OTHER): Payer: Medicare Other

## 2018-06-17 DIAGNOSIS — M79602 Pain in left arm: Secondary | ICD-10-CM | POA: Diagnosis not present

## 2018-06-17 DIAGNOSIS — R293 Abnormal posture: Secondary | ICD-10-CM | POA: Diagnosis not present

## 2018-06-17 DIAGNOSIS — Z23 Encounter for immunization: Secondary | ICD-10-CM | POA: Diagnosis not present

## 2018-06-17 DIAGNOSIS — M2560 Stiffness of unspecified joint, not elsewhere classified: Secondary | ICD-10-CM | POA: Diagnosis not present

## 2018-06-17 DIAGNOSIS — M6281 Muscle weakness (generalized): Secondary | ICD-10-CM | POA: Diagnosis not present

## 2018-06-17 DIAGNOSIS — M5030 Other cervical disc degeneration, unspecified cervical region: Secondary | ICD-10-CM | POA: Diagnosis not present

## 2018-06-17 DIAGNOSIS — M79601 Pain in right arm: Secondary | ICD-10-CM | POA: Diagnosis not present

## 2018-06-17 DIAGNOSIS — M25612 Stiffness of left shoulder, not elsewhere classified: Secondary | ICD-10-CM | POA: Diagnosis not present

## 2018-06-17 DIAGNOSIS — M25611 Stiffness of right shoulder, not elsewhere classified: Secondary | ICD-10-CM | POA: Diagnosis not present

## 2018-06-17 DIAGNOSIS — R51 Headache: Secondary | ICD-10-CM | POA: Diagnosis not present

## 2018-06-21 ENCOUNTER — Other Ambulatory Visit: Payer: Self-pay | Admitting: Family Medicine

## 2018-06-21 DIAGNOSIS — M79602 Pain in left arm: Secondary | ICD-10-CM | POA: Diagnosis not present

## 2018-06-21 DIAGNOSIS — M2560 Stiffness of unspecified joint, not elsewhere classified: Secondary | ICD-10-CM | POA: Diagnosis not present

## 2018-06-21 DIAGNOSIS — M5030 Other cervical disc degeneration, unspecified cervical region: Secondary | ICD-10-CM | POA: Diagnosis not present

## 2018-06-21 DIAGNOSIS — M6281 Muscle weakness (generalized): Secondary | ICD-10-CM | POA: Diagnosis not present

## 2018-06-21 DIAGNOSIS — M79601 Pain in right arm: Secondary | ICD-10-CM | POA: Diagnosis not present

## 2018-06-21 DIAGNOSIS — R51 Headache: Secondary | ICD-10-CM | POA: Diagnosis not present

## 2018-06-23 ENCOUNTER — Ambulatory Visit: Payer: Medicare Other

## 2018-06-24 DIAGNOSIS — M79602 Pain in left arm: Secondary | ICD-10-CM | POA: Diagnosis not present

## 2018-06-24 DIAGNOSIS — M2560 Stiffness of unspecified joint, not elsewhere classified: Secondary | ICD-10-CM | POA: Diagnosis not present

## 2018-06-24 DIAGNOSIS — M5030 Other cervical disc degeneration, unspecified cervical region: Secondary | ICD-10-CM | POA: Diagnosis not present

## 2018-06-24 DIAGNOSIS — M6281 Muscle weakness (generalized): Secondary | ICD-10-CM | POA: Diagnosis not present

## 2018-06-24 DIAGNOSIS — R51 Headache: Secondary | ICD-10-CM | POA: Diagnosis not present

## 2018-06-24 DIAGNOSIS — M79601 Pain in right arm: Secondary | ICD-10-CM | POA: Diagnosis not present

## 2018-06-29 DIAGNOSIS — M2560 Stiffness of unspecified joint, not elsewhere classified: Secondary | ICD-10-CM | POA: Diagnosis not present

## 2018-06-29 DIAGNOSIS — M6281 Muscle weakness (generalized): Secondary | ICD-10-CM | POA: Diagnosis not present

## 2018-06-29 DIAGNOSIS — M79601 Pain in right arm: Secondary | ICD-10-CM | POA: Diagnosis not present

## 2018-06-29 DIAGNOSIS — M79602 Pain in left arm: Secondary | ICD-10-CM | POA: Diagnosis not present

## 2018-06-29 DIAGNOSIS — R51 Headache: Secondary | ICD-10-CM | POA: Diagnosis not present

## 2018-06-29 DIAGNOSIS — M5030 Other cervical disc degeneration, unspecified cervical region: Secondary | ICD-10-CM | POA: Diagnosis not present

## 2018-07-01 DIAGNOSIS — M5030 Other cervical disc degeneration, unspecified cervical region: Secondary | ICD-10-CM | POA: Diagnosis not present

## 2018-07-01 DIAGNOSIS — M79601 Pain in right arm: Secondary | ICD-10-CM | POA: Diagnosis not present

## 2018-07-01 DIAGNOSIS — R51 Headache: Secondary | ICD-10-CM | POA: Diagnosis not present

## 2018-07-01 DIAGNOSIS — M6281 Muscle weakness (generalized): Secondary | ICD-10-CM | POA: Diagnosis not present

## 2018-07-01 DIAGNOSIS — M79602 Pain in left arm: Secondary | ICD-10-CM | POA: Diagnosis not present

## 2018-07-01 DIAGNOSIS — M2560 Stiffness of unspecified joint, not elsewhere classified: Secondary | ICD-10-CM | POA: Diagnosis not present

## 2018-07-06 DIAGNOSIS — R51 Headache: Secondary | ICD-10-CM | POA: Diagnosis not present

## 2018-07-06 DIAGNOSIS — M2560 Stiffness of unspecified joint, not elsewhere classified: Secondary | ICD-10-CM | POA: Diagnosis not present

## 2018-07-06 DIAGNOSIS — M5030 Other cervical disc degeneration, unspecified cervical region: Secondary | ICD-10-CM | POA: Diagnosis not present

## 2018-07-06 DIAGNOSIS — M79602 Pain in left arm: Secondary | ICD-10-CM | POA: Diagnosis not present

## 2018-07-06 DIAGNOSIS — M79601 Pain in right arm: Secondary | ICD-10-CM | POA: Diagnosis not present

## 2018-07-06 DIAGNOSIS — M6281 Muscle weakness (generalized): Secondary | ICD-10-CM | POA: Diagnosis not present

## 2018-07-08 DIAGNOSIS — M5030 Other cervical disc degeneration, unspecified cervical region: Secondary | ICD-10-CM | POA: Diagnosis not present

## 2018-07-08 DIAGNOSIS — R51 Headache: Secondary | ICD-10-CM | POA: Diagnosis not present

## 2018-07-08 DIAGNOSIS — M79602 Pain in left arm: Secondary | ICD-10-CM | POA: Diagnosis not present

## 2018-07-08 DIAGNOSIS — M2560 Stiffness of unspecified joint, not elsewhere classified: Secondary | ICD-10-CM | POA: Diagnosis not present

## 2018-07-08 DIAGNOSIS — M6281 Muscle weakness (generalized): Secondary | ICD-10-CM | POA: Diagnosis not present

## 2018-07-08 DIAGNOSIS — M79601 Pain in right arm: Secondary | ICD-10-CM | POA: Diagnosis not present

## 2018-07-13 DIAGNOSIS — R51 Headache: Secondary | ICD-10-CM | POA: Diagnosis not present

## 2018-07-13 DIAGNOSIS — M79601 Pain in right arm: Secondary | ICD-10-CM | POA: Diagnosis not present

## 2018-07-13 DIAGNOSIS — M6281 Muscle weakness (generalized): Secondary | ICD-10-CM | POA: Diagnosis not present

## 2018-07-13 DIAGNOSIS — M79602 Pain in left arm: Secondary | ICD-10-CM | POA: Diagnosis not present

## 2018-07-13 DIAGNOSIS — M5030 Other cervical disc degeneration, unspecified cervical region: Secondary | ICD-10-CM | POA: Diagnosis not present

## 2018-07-13 DIAGNOSIS — M2560 Stiffness of unspecified joint, not elsewhere classified: Secondary | ICD-10-CM | POA: Diagnosis not present

## 2018-07-20 DIAGNOSIS — M2560 Stiffness of unspecified joint, not elsewhere classified: Secondary | ICD-10-CM | POA: Diagnosis not present

## 2018-07-20 DIAGNOSIS — R293 Abnormal posture: Secondary | ICD-10-CM | POA: Diagnosis not present

## 2018-07-20 DIAGNOSIS — M6281 Muscle weakness (generalized): Secondary | ICD-10-CM | POA: Diagnosis not present

## 2018-07-20 DIAGNOSIS — M25612 Stiffness of left shoulder, not elsewhere classified: Secondary | ICD-10-CM | POA: Diagnosis not present

## 2018-07-20 DIAGNOSIS — M79602 Pain in left arm: Secondary | ICD-10-CM | POA: Diagnosis not present

## 2018-07-20 DIAGNOSIS — M5030 Other cervical disc degeneration, unspecified cervical region: Secondary | ICD-10-CM | POA: Diagnosis not present

## 2018-07-20 DIAGNOSIS — R51 Headache: Secondary | ICD-10-CM | POA: Diagnosis not present

## 2018-07-20 DIAGNOSIS — M25611 Stiffness of right shoulder, not elsewhere classified: Secondary | ICD-10-CM | POA: Diagnosis not present

## 2018-07-20 DIAGNOSIS — M79601 Pain in right arm: Secondary | ICD-10-CM | POA: Diagnosis not present

## 2018-07-22 DIAGNOSIS — M6281 Muscle weakness (generalized): Secondary | ICD-10-CM | POA: Diagnosis not present

## 2018-07-22 DIAGNOSIS — M79602 Pain in left arm: Secondary | ICD-10-CM | POA: Diagnosis not present

## 2018-07-22 DIAGNOSIS — M79601 Pain in right arm: Secondary | ICD-10-CM | POA: Diagnosis not present

## 2018-07-22 DIAGNOSIS — R51 Headache: Secondary | ICD-10-CM | POA: Diagnosis not present

## 2018-07-22 DIAGNOSIS — M2560 Stiffness of unspecified joint, not elsewhere classified: Secondary | ICD-10-CM | POA: Diagnosis not present

## 2018-07-22 DIAGNOSIS — M5030 Other cervical disc degeneration, unspecified cervical region: Secondary | ICD-10-CM | POA: Diagnosis not present

## 2018-10-26 DIAGNOSIS — G43719 Chronic migraine without aura, intractable, without status migrainosus: Secondary | ICD-10-CM | POA: Diagnosis not present

## 2018-10-26 DIAGNOSIS — G43019 Migraine without aura, intractable, without status migrainosus: Secondary | ICD-10-CM | POA: Diagnosis not present

## 2018-10-31 ENCOUNTER — Other Ambulatory Visit: Payer: Self-pay | Admitting: Cardiovascular Disease

## 2018-12-20 ENCOUNTER — Other Ambulatory Visit: Payer: Self-pay | Admitting: Family Medicine

## 2019-01-14 NOTE — Progress Notes (Addendum)
Virtual Visit via Video Note  I connected with patient on 01/17/19 at  8:00 AM EDT by a video enabled telemedicine application and verified that I am speaking with the correct person using two identifiers.   THIS ENCOUNTER IS A VIRTUAL VISIT DUE TO COVID-19 - PATIENT WAS NOT SEEN IN THE OFFICE. PATIENT HAS CONSENTED TO VIRTUAL VISIT / TELEMEDICINE VISIT   Location of patient: home  Location of provider: office  I discussed the limitations of evaluation and management by telemedicine and the availability of in person appointments. The patient expressed understanding and agreed to proceed.   Subjective:   Brenda Lara is a 68 y.o. female who presents for an Initial Medicare Annual Wellness Visit.  Review of Systems   No ROS.  Medicare Wellness Virtual Visit. UTA vital signs.  Additional risk factors are reflected in the social history.  Reads a lot with church group. Volunteers with church. Cardiac Risk Factors include: advanced age (>41men, >59 women);hypertension Sleep patterns:  " I sleep great" Home Safety/Smoke Alarms: Feels safe in home. Smoke alarms in place.  Lives with husband. Husband still working full time. 1 story home. No stairs.  Female:     Mammo-03/05/18       Dexa scan-  01/28/16      [facility]S-01/26/14. 5 yr recall. Pt states she will call to schedule. Eye- yearly with Dr.Fox. UTD per pt.    Objective:    Unable to assess vitals. This visit is enabled though telemedicine due to Covid 19. Pt reports BP=135/71.  Advanced Directives 01/17/2019 03/08/2014 01/12/2014  Does Patient Have a Medical Advance Directive? No Patient does not have advance directive Patient does not have advance directive  Would patient like information on creating a medical advance directive? Yes (MAU/Ambulatory/Procedural Areas - Information given) - -  Pre-existing out of facility DNR order (yellow form or pink MOST form) - - No    Current Medications (verified) Outpatient Encounter  Medications as of 01/17/2019  Medication Sig  . aspirin 325 MG tablet Take 1 tablet (325 mg total) by mouth daily.  Marland Kitchen atenolol (TENORMIN) 50 MG tablet TAKE 1 TABLET BY MOUTH TWICE A DAY  . atorvastatin (LIPITOR) 40 MG tablet TAKE 1 TABLET BY MOUTH EVERY DAY  . Ferrous Fumarate-Folic Acid (HEMATINIC/FOLIC ACID) 353-2 MG TABS Take 1 tablet by mouth daily.  . irbesartan (AVAPRO) 75 MG tablet TAKE 1 TABLET BY MOUTH EVERY DAY  . meclizine (ANTIVERT) 25 MG tablet Take 1 tablet (25 mg total) by mouth 3 (three) times daily as needed for dizziness.  . topiramate (TOPAMAX) 100 MG tablet Take 100 mg by mouth daily.   . baclofen (LIORESAL) 10 MG tablet Take 10 mg by mouth daily as needed for muscle spasms.   . nitroGLYCERIN (NITROSTAT) 0.4 MG SL tablet Place 1 tablet (0.4 mg total) under the tongue every 5 (five) minutes as needed for chest pain. (Patient not taking: Reported on 01/17/2019)  . ranitidine (ZANTAC) 300 MG tablet Take 1 tablet (300 mg total) by mouth at bedtime as needed for heartburn. (Patient not taking: Reported on 01/17/2019)  . vitamin C (ASCORBIC ACID) 500 MG tablet Take 500 mg by mouth daily.   No facility-administered encounter medications on file as of 01/17/2019.     Allergies (verified) Patient has no known allergies.   History: Past Medical History:  Diagnosis Date  . Anemia   . Angina pectoris   . Anxiety   . Cervical cancer screening 01/31/2016   Menarche at  12 Regular and moderate flow No history of abnormal pap in past G1P1, s/p 1 SVD No history of abnormal MGM No concerns today gyn surgeries b/l tubal ligation and b/y oophorectomy  . Colon cancer ([place]) 2010  . Coronary artery disease 2001/2005   S/P stenting to RCA and LAD  . GERD (gastroesophageal reflux disease)   . Hearing loss 08/05/2015  . History of chicken pox   . HTN (hypertension)   . Menopause   . Preventative health care 01/31/2016  . Pure hypercholesterolemia   . Vitamin D deficiency 06/01/2017   Past  Surgical History:  Procedure Laterality Date  . APPENDECTOMY  02/21/2009  . [place]   multiple  . BILATERAL OOPHORECTOMY  2010  . CHOLECYSTECTOMY  11/09  . COLON SURGERY  2010   right colectomy  . COLONOSCOPY  01/26/2014   NORMAL   . CORONARY STENT PLACEMENT  2001/2005   RCA  . CYSTECTOMY     left ovary   Family History  Problem Relation Age of Onset  . Hypertension Mother   . Transient ischemic attack Mother   . Heart attack Mother        age 26  . Heart disease Mother   . Leukemia Mother   . Heart attack Father        62's  . Cancer Father        prostate  . Heart disease Father   . Alcohol abuse Father   . Arthritis Father        mva  . Thyroid disease Daughter   . Coronary artery disease Other   . Colon cancer Neg Hx   . Pancreatic cancer Neg Hx   . Rectal cancer Neg Hx   . Stomach cancer Neg Hx    Social History   Socioeconomic History  . Marital status: Married    Spouse name: Not on file  . Number of children: Not on file  . Years of education: Not on file  . Highest education level: Not on file  Occupational History  . Not on file  Social Needs  . Financial resource strain: Not on file  . Food insecurity:    Worry: Not on file    Inability: Not on file  . Transportation needs:    Medical: Not on file    Non-medical: Not on file  Tobacco Use  . Smoking status: Never Smoker  . Smokeless tobacco: Never Used  Substance and Sexual Activity  . Alcohol use: No    Alcohol/week: 1.0 standard drinks    Types: 1 Standard drinks or equivalent per week  . Drug use: No  . 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 Physical activity:    Days per week: Not on file    Minutes per session: Not on file  . Stress: Not on file  Relationships  . Social connections:    Talks on phone: Not on file    Gets together: Not on file     Attends religious service: Not on file    Active member of club or organization: Not on file    Attends meetings of clubs or organizations: Not on file    Relationship status: Not on file  Other Topics Concern  . Not on file  Social History Narrative  . Not on file    Tobacco Counseling Counseling given:  Not Answered   Clinical Intake:     Pain : No/denies pain    Activities of Daily Living In your present state of health, do you have any difficulty performing the following activities: 01/17/2019  Hearing? N  Vision? N  Difficulty concentrating or making decisions? N  Walking or climbing stairs? N  Dressing or bathing? N  Doing errands, shopping? N  Preparing Food and eating ? N  Using the Toilet? N  In the past six months, have you accidently leaked urine? N  Do you have problems with loss of bowel control? N  Managing your Medications? N  Managing your Finances? N  Housekeeping or managing your Housekeeping? N  Some recent data might be hidden     Immunizations and Health Maintenance Immunization History  Administered Date(s) Administered  . Influenza Split 06/27/2011  . Influenza, High Dose Seasonal PF 06/17/2018  . Influenza-Unspecified 06/17/2017  . Pneumococcal Conjugate-13 06/01/2017  . Tdap 07/02/2011   Health Maintenance Due  Topic Date Due  . Hepatitis C Screening  [birth year]  . PNA vac Low Risk Adult (2 of 2 - PPSV23) 06/01/2018    Patient Care Team: Mosie Lukes, MD as PCP - General (Family Medicine) Benay Spice Izola Price, MD as Consulting Physician (Oncology)  Indicate any recent Medical Services you may have received from other than [facility] providers in the past year (date may be approximate).     Assessment:   This is a routine wellness examination for Cris. Physical assessment deferred to PCP.  Hearing/Vision screen Unable to assess. This visit is enabled though telemedicine due to Covid 19.   Dietary issues and exercise activities  discussed: Current Exercise Habits: The patient does not participate in regular exercise at present, Exercise limited by: None identified Diet (meal preparation, eat out, water intake, caffeinated beverages, dairy products, fruits and vegetables): well balanced, on average, 2 meals per day   Goals    . maintain healthy lifestyle      Depression Screen PHQ 2/9 Scores 01/17/2019 06/01/2017  PHQ - 2 Score 0 0    Fall Risk Fall Risk  01/17/2019 06/01/2017  Falls in the past year? 0 No    Cognitive Function: Ad8 score reviewed for issues:  Issues making decisions:no  Less interest in hobbies / activities:no  Repeats questions, stories (family complaining):no  Trouble using ordinary gadgets (microwave, computer, phone):no  Forgets the month or year: no  Mismanaging finances: no  Remembering appts:no  Daily problems with thinking and/or memory:no Ad8 score is=0         Screening Tests Health Maintenance  Topic Date Due  . Hepatitis C Screening  07-May-1951  . PNA vac Low Risk Adult (2 of 2 - PPSV23) 06/01/2018  . COLONOSCOPY  01/27/2019  . MAMMOGRAM  03/06/2019  . INFLUENZA VACCINE  04/16/2019  . TETANUS/TDAP  07/01/2021  . DEXA SCAN  Completed     Plan:    Please schedule your next medicare wellness visit with me in 1 yr.  Continue to eat heart healthy diet (full of fruits, vegetables, whole grains, lean protein, water--limit salt, fat, and sugar intake) and increase physical activity as tolerated.  Continue doing brain stimulating activities (puzzles, reading, adult coloring books, staying active) to keep memory sharp.   Bring a copy of your living will and/or healthcare power of attorney to your next office visit. I have emailed you the documents.  I have personally reviewed and noted the following in the patient's chart:   .  Medical and social history . Use of alcohol, tobacco or illicit drugs  . Current medications and supplements . Functional ability and  status . Nutritional status . Physical activity . Advanced directives . List of other physicians . Hospitalizations, surgeries, and ER visits in previous 12 months . Vitals . Screenings to include cognitive, depression, and falls . Referrals and appointments  In addition, I have reviewed and discussed with patient certain preventive protocols, quality metrics, and best practice recommendations. A written personalized care plan for preventive services as well as general preventive health recommendations were provided to patient.     Shela Nevin, [place]   01/17/2019    Medical screening examination/treatment was performed by qualified clinical staff member and as supervising physician I was immediately available for consultation/collaboration. I have reviewed documentation and agree with assessment and plan.  Penni Homans, MD

## 2019-01-17 ENCOUNTER — Encounter: Payer: Self-pay | Admitting: *Deleted

## 2019-01-17 ENCOUNTER — Ambulatory Visit (INDEPENDENT_AMBULATORY_CARE_PROVIDER_SITE_OTHER): Payer: Medicare Other | Admitting: *Deleted

## 2019-01-17 ENCOUNTER — Other Ambulatory Visit: Payer: Self-pay

## 2019-01-17 DIAGNOSIS — Z0000 Encounter for general adult medical examination without abnormal findings: Secondary | ICD-10-CM | POA: Diagnosis not present

## 2019-01-17 NOTE — Patient Instructions (Signed)
Please schedule your next medicare wellness visit with me in 1 yr.  Continue to eat heart healthy diet (full of fruits, vegetables, whole grains, lean protein, water--limit salt, fat, and sugar intake) and increase physical activity as tolerated.  Continue doing brain stimulating activities (puzzles, reading, adult coloring books, staying active) to keep memory sharp.   Bring a copy of your living will and/or healthcare power of attorney to your next office visit. I have emailed you the documents.   Brenda Lara , Thank you for taking time to come for your Medicare Wellness Visit. I appreciate your ongoing commitment to your health goals. Please review the following plan we discussed and let me know if I can assist you in the future.   These are the goals we discussed: Goals    . maintain healthy lifestyle       This is a list of the screening recommended for you and due dates:  Health Maintenance  Topic Date Due  .  Hepatitis C: One time screening is recommended by Center for Disease Control  (CDC) for  adults born from [birth year] through 1965.   10-20-1950  . Pneumonia vaccines (2 of 2 - PPSV23) 06/01/2018  . Colon Cancer Screening  01/27/2019  . Mammogram  03/06/2019  . Flu Shot  04/16/2019  . Tetanus Vaccine  07/01/2021  . DEXA scan (bone density measurement)  Completed    Health Maintenance After Age 72 After age 2, you are at a higher risk for certain long-term diseases and infections as well as injuries from falls. Falls are a major cause of broken bones and head injuries in people who are older than age 32. Getting regular preventive care can help to keep you healthy and well. Preventive care includes getting regular testing and making lifestyle changes as recommended by your health care provider. Talk with your health care provider about:  Which screenings and tests you should have. A screening is a test that checks for a disease when you have no symptoms.  A diet and exercise  plan that is right for you. What should I know about screenings and tests to prevent falls? Screening and testing are the best ways to find a health problem early. Early diagnosis and treatment give you the best chance of managing medical conditions that are common after age 28. Certain conditions and lifestyle choices may make you more likely to have a fall. Your health care provider may recommend:  Regular vision checks. Poor vision and conditions such as cataracts can make you more likely to have a fall. If you wear glasses, make sure to get your prescription updated if your vision changes.  Medicine review. Work with your health care provider to regularly review all of the medicines you are taking, including over-the-counter medicines. Ask your health care provider about any side effects that may make you more likely to have a fall. Tell your health care provider if any medicines that you take make you feel dizzy or sleepy.  Osteoporosis screening. Osteoporosis is a condition that causes the bones to get weaker. This can make the bones weak and cause them to break more easily.  Blood pressure screening. Blood pressure changes and medicines to control blood pressure can make you feel dizzy.  Strength and balance checks. Your health care provider may recommend certain tests to check your strength and balance while standing, walking, or changing positions.  Foot health exam. Foot pain and numbness, as well as not wearing proper footwear, can  make you more likely to have a fall.  Depression screening. You may be more likely to have a fall if you have a fear of falling, feel emotionally low, or feel unable to do activities that you used to do.  Alcohol use screening. Using too much alcohol can affect your balance and may make you more likely to have a fall. What actions can I take to lower my risk of falls? General instructions  Talk with your health care provider about your risks for falling.  Tell your health care provider if: ? You fall. Be sure to tell your health care provider about all falls, even ones that seem minor. ? You feel dizzy, sleepy, or off-balance.  Take over-the-counter and prescription medicines only as told by your health care provider. These include any supplements.  Eat a healthy diet and maintain a healthy weight. A healthy diet includes low-fat dairy products, low-fat (lean) meats, and fiber from whole grains, beans, and lots of fruits and vegetables. Home safety  Remove any tripping hazards, such as rugs, cords, and clutter.  Install safety equipment such as grab bars in bathrooms and safety rails on stairs.  Keep rooms and walkways well-lit. Activity   Follow a regular exercise program to stay fit. This will help you maintain your balance. Ask your health care provider what types of exercise are appropriate for you.  If you need a cane or walker, use it as recommended by your health care provider.  Wear supportive shoes that have nonskid soles. Lifestyle  Do not drink alcohol if your health care provider tells you not to drink.  If you drink alcohol, limit how much you have: ? 0-1 drink a day for women. ? 0-2 drinks a day for men.  Be aware of how much alcohol is in your drink. In the [place] one drink equals one typical bottle of beer (12 oz), one-half glass of wine (5 oz), or one shot of hard liquor (1 oz).  Do not use any products that contain nicotine or tobacco, such as cigarettes and e-cigarettes. If you need help quitting, ask your health care provider. Summary  Having a healthy lifestyle and getting preventive care can help to protect your health and wellness after age 38.  Screening and testing are the best way to find a health problem early and help you avoid having a fall. Early diagnosis and treatment give you the best chance for managing medical conditions that are more common for people who are older than age 54.  Falls are a  major cause of broken bones and head injuries in people who are older than age 34. Take precautions to prevent a fall at home.  Work with your health care provider to learn what changes you can make to improve your health and wellness and to prevent falls. This information is not intended to replace advice given to you by your health care provider. Make sure you discuss any questions you have with your health care provider. Document Released: 07/15/2017 Document Revised: 07/15/2017 Document Reviewed: 07/15/2017 Elsevier Interactive Patient Education  2019 [employer].

## 2019-02-03 ENCOUNTER — Other Ambulatory Visit: Payer: Self-pay | Admitting: Family Medicine

## 2019-02-15 ENCOUNTER — Encounter: Payer: Self-pay | Admitting: Internal Medicine

## 2019-02-23 ENCOUNTER — Other Ambulatory Visit: Payer: Self-pay

## 2019-02-23 ENCOUNTER — Encounter: Payer: Self-pay | Admitting: Medical

## 2019-02-23 ENCOUNTER — Ambulatory Visit (INDEPENDENT_AMBULATORY_CARE_PROVIDER_SITE_OTHER): Payer: Medicare Other | Admitting: Medical

## 2019-02-23 VITALS — BP 160/80 | HR 71 | Temp 98.2°F | Resp 18 | Wt 137.0 lb

## 2019-02-23 DIAGNOSIS — K30 Functional dyspepsia: Secondary | ICD-10-CM | POA: Diagnosis not present

## 2019-02-23 DIAGNOSIS — R55 Syncope and collapse: Secondary | ICD-10-CM | POA: Diagnosis not present

## 2019-02-23 DIAGNOSIS — R110 Nausea: Secondary | ICD-10-CM | POA: Diagnosis not present

## 2019-02-23 MED ORDER — ONDANSETRON 4 MG PO TBDP: 4.0000 mg | ORAL_TABLET | Freq: Three times a day (TID) | ORAL | 0 refills | Status: DC | PRN

## 2019-02-23 MED ORDER — FAMOTIDINE 20 MG PO TABS: 20.0000 mg | ORAL_TABLET | Freq: Every day | ORAL | 0 refills | Status: DC

## 2019-02-23 MED ORDER — OMEPRAZOLE 20 MG PO CPDR: DELAYED_RELEASE_CAPSULE | ORAL | 3 refills | Status: DC

## 2019-02-23 MED FILL — ONDANSETRON ODT 4 MG TABLET: 4 | 7 days supply | Qty: 20 | Fill #0

## 2019-02-23 MED FILL — FAMOTIDINE 40 MG TABLET: 40 | 30 days supply | Qty: 15 | Fill #0

## 2019-02-23 MED FILL — OMEPRAZOLE 20 MG CPDR: 20 | 30 days supply | Qty: 60 | Fill #0

## 2019-02-23 NOTE — Patient Instructions (Addendum)
For your recent upset stomach with daily nausea, this could represent some recurrence of GERD as he had.  I do want to get H. pylori breath test, metabolic panel, amylase, lipase and CBC today.  Prescription of omeprazole 20mg  sent to pharmacy.  Also you can use famotidine in conjunction with omeprazole.  Zofran written for nausea or vomiting.  For near syncope we did EKG today which shows normal sinus rhythm but a little bit bradycardia cardiac.  Because of this I did check your pulse on short walk and your pulse did increase which is normal.  Low pulse rate on EKG was likely due to resting.  Your blood pressure is elevated presently but it was much better at home.  I need you to check your blood pressure daily when you are relaxed and also check in the event of any recurrent dizziness.  I suspect today that your blood pressure was elevated due to white coat BP elevation.  However if your blood pressure is remaining over 140/90 then might need to give you blood pressure medication.  If you have any syncopal type episode then recommend ED evaluation.  Follow-up in 10 days or as needed.

## 2019-02-23 NOTE — Progress Notes (Signed)
Subjective:    Patient ID: Brenda Lara, female    DOB: 1951-05-25, 68 y.o.   MRN: [id number]  HPI  Pt in for upset stomach over past 2-3 weeks. She has decreased appetite and nausea if she eats. This is constant daily pain. Has sour taste to her mouth. Not belching any. Pt has rare occasional cough now and in the past. Pt states had gerd in past but about 2 years ago stopped her ppi. Pt did take 40 mg omeprazole of her husbands and it did not help. Tried 7 day course about one week ago. Pt has tried tums and pepto bismol but none in past 48 hours or more.   Yesterday after shower. She states moderate to severe light headed briefly after shower. Felt like was about to pass out but did not. Feeling of presynope resloved quickly. But slight light headed afterwords for 2-3 hours. Now that has resolved. At time she felt dizzy she checked bp and was 163/108. Before she came her her bp was 123/60   Review of Systems  Constitutional: Negative for chills, fatigue and fever.  HENT: Negative for congestion.   Respiratory: Negative for cough, chest tightness, shortness of breath and wheezing.   Cardiovascular: Negative for chest pain and palpitations.  Gastrointestinal: Positive for abdominal pain and nausea. Negative for abdominal distention, diarrhea and vomiting.       See hpi for full description.  Endocrine: Negative for polydipsia, polyphagia and polyuria.  Genitourinary: Negative for dysuria, flank pain, frequency and pelvic pain.  Musculoskeletal: Negative for back pain.  Skin: Negative for rash.  Neurological: Positive for dizziness and light-headedness. Negative for seizures, syncope, weakness and numbness.       None current but see hpi. No sycope but felt near syncope yesterday.  Hematological: Negative for adenopathy. Does not bruise/bleed easily.  Psychiatric/Behavioral: Negative for behavioral problems, confusion, sleep disturbance and suicidal ideas. The patient is not  nervous/anxious.    Past Medical History:  Diagnosis Date  . Anemia   . Angina pectoris   . Anxiety   . Cervical cancer screening 01/31/2016   Menarche at 12 Regular and moderate flow No history of abnormal pap in past G1P1, s/p 1 SVD No history of abnormal MGM No concerns today gyn surgeries b/l tubal ligation and b/y oophorectomy  . Colon cancer ([place]) 2010  . Coronary artery disease 2001/2005   S/P stenting to RCA and LAD  . GERD (gastroesophageal reflux disease)   . Hearing loss 08/05/2015  . History of chicken pox   . HTN (hypertension)   . Menopause   . Preventative health care 01/31/2016  . Pure hypercholesterolemia   . Vitamin D deficiency 06/01/2017     Social History   Socioeconomic History  . Marital status: Married    Spouse name: Not on file  . Number of children: Not on file  . Years of education: Not on file  . Highest education level: Not on file  Occupational History  . Not on file  Social Needs  . Financial resource strain: Not on file  . Food insecurity:    Worry: Not on file    Inability: Not on file  . Transportation needs:    Medical: Not on file    Non-medical: Not on file  Tobacco Use  . Smoking status: Never Smoker  . Smokeless tobacco: Never Used  Substance and Sexual Activity  . Alcohol use: No    Alcohol/week: 1.0 standard drinks  Types: 1 Standard drinks or equivalent per week  . Drug use: No  . 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 Physical activity:    Days per week: Not on file    Minutes per session: Not on file  . Stress: Not on file  Relationships  . Social connections:    Talks on phone: Not on file    Gets together: Not on file    Attends religious service: Not on file    Active member of club or organization: Not on file    Attends meetings of clubs or organizations: Not on file    Relationship status:  Not on file  . Intimate partner violence:    Fear of current or ex partner: Not on file    Emotionally abused: Not on file    Physically abused: Not on file    Forced sexual activity: Not on file  Other Topics Concern  . Not on file  Social History Narrative  . Not on file    Past Surgical History:  Procedure Laterality Date  . APPENDECTOMY  02/21/2009  . [place]   multiple  . BILATERAL OOPHORECTOMY  2010  . CHOLECYSTECTOMY  11/09  . COLON SURGERY  2010   right colectomy  . COLONOSCOPY  01/26/2014   NORMAL   . CORONARY STENT PLACEMENT  2001/2005   RCA  . CYSTECTOMY     left ovary    Family History  Problem Relation Age of Onset  . Hypertension Mother   . Transient ischemic attack Mother   . Heart attack Mother        age 56  . Heart disease Mother   . Leukemia Mother   . Heart attack Father        71's  . Cancer Father        prostate  . Heart disease Father   . Alcohol abuse Father   . Arthritis Father        mva  . Thyroid disease Daughter   . Coronary artery disease Other   . Colon cancer Neg Hx   . Pancreatic cancer Neg Hx   . Rectal cancer Neg Hx   . Stomach cancer Neg Hx     No Known Allergies  Current Outpatient Medications on File Prior to Visit  Medication Sig Dispense Refill  . aspirin 325 MG tablet Take 1 tablet (325 mg total) by mouth daily.    Marland Kitchen atenolol (TENORMIN) 50 MG tablet TAKE 1 TABLET BY MOUTH TWICE A DAY 180 tablet 3  . atorvastatin (LIPITOR) 40 MG tablet TAKE 1 TABLET BY MOUTH EVERY DAY 90 tablet 1  . baclofen (LIORESAL) 10 MG tablet Take 10 mg by mouth daily as needed for muscle spasms.     . Ferrous Fumarate-Folic Acid (HEMATINIC/FOLIC ACID) 725-3 MG TABS Take 1 tablet by mouth daily. 90 each 3  . irbesartan (AVAPRO) 75 MG tablet TAKE 1 TABLET BY MOUTH EVERY DAY 90 tablet 1  . meclizine (ANTIVERT) 25 MG tablet Take 1 tablet (25 mg total) by mouth 3 (three) times daily as needed for dizziness. 90 tablet 0  .  nitroGLYCERIN (NITROSTAT) 0.4 MG SL tablet Place 1 tablet (0.4 mg total) under the tongue every 5 (five) minutes as needed for chest pain. (Patient not taking: Reported on 01/17/2019) 25 tablet 4  . ranitidine (ZANTAC) 300  MG tablet Take 1 tablet (300 mg total) by mouth at bedtime as needed for heartburn. (Patient not taking: Reported on 01/17/2019) 30 tablet 3  . topiramate (TOPAMAX) 100 MG tablet Take 100 mg by mouth daily.     . vitamin C (ASCORBIC ACID) 500 MG tablet Take 500 mg by mouth daily.     No current facility-administered medications on file prior to visit.     BP (!) 160/80   Pulse 71 Comment: after brief walk but was bradycardic little bit at rest.  Temp 98.2 F (36.8 C) (Oral)   Resp 18   Wt 137 lb (62.1 kg)   SpO2 100%   BMI 25.89 kg/m       Objective:   Physical Exam  General Mental Status- Alert. General Appearance- Not in acute distress.   Skin General: Color- Normal Color. Moisture- Normal Moisture.  Neck Carotid Arteries- Normal color. Moisture- Normal Moisture. No carotid bruits. No JVD.  Chest and Lung Exam Auscultation: Breath Sounds:-Normal.  Cardiovascular Auscultation:Rythm- Regular. Murmurs & Other Heart Sounds:Auscultation of the heart reveals- No Murmurs.  Abdomen Inspection:-Inspeection Normal. Palpation/Percussion:Note:No mass. Palpation and Percussion of the abdomen reveal- faint epigastric Tender, Non Distended + BS, no rebound or guarding.    Neurologic Cranial Nerve exam:- CN III-XII intact(No nystagmus), symmetric smile. Drift Test:- No drift. Finger to Nose:- Normal/Intact Strength:- 5/5 equal and symmetric strength both upper and lower extremities.  Back- no cva pain      Assessment & Plan:  For your recent upset stomach with daily nausea, this could represent some recurrence of GERD as he had.  I do want to get H. pylori breath test, metabolic panel, amylase, lipase and CBC today.  Prescription of omeprazole 20mg  sent to  pharmacy.  Also you can use famotidine in conjunction with omeprazole.  Zofran written for nausea or vomiting.  For near syncope we did EKG today which shows normal sinus rhythm but a little bit bradycardia cardiac.  Because of this I did check your pulse on short walk and your pulse did increase which is normal.  Low pulse rate on EKG was likely due to resting.  Your blood pressure is elevated presently but it was much better at home.  I need you to check your blood pressure daily when you are relaxed and also check in the event of any recurrent dizziness.  I suspect today that your blood pressure was elevated due to white coat BP elevation.  However if your blood pressure is remaining over 140/90 then might need to give you blood pressure medication.  If you have any syncopal type episode then recommend ED evaluation.  Follow-up in 10 days or as needed.  Mackie Pai, PA-C

## 2019-02-24 LAB — CBC WITH DIFFERENTIAL/PLATELET
Basophils Absolute: 0 10*3/uL (ref 0.0–0.1)
Basophils Relative: 0.7 % (ref 0.0–3.0)
Eosinophils Absolute: 0 10*3/uL (ref 0.0–0.7)
Eosinophils Relative: 0.6 % (ref 0.0–5.0)
HCT: 41 % (ref 36.0–46.0)
Hemoglobin: 13.6 g/dL (ref 12.0–15.0)
Lymphocytes Relative: 28.7 % (ref 12.0–46.0)
Lymphs Abs: 1.8 10*3/uL (ref 0.7–4.0)
MCHC: 33.2 g/dL (ref 30.0–36.0)
MCV: 86.4 fl (ref 78.0–100.0)
Monocytes Absolute: 0.5 10*3/uL (ref 0.1–1.0)
Monocytes Relative: 8.6 % (ref 3.0–12.0)
Neutro Abs: 3.9 10*3/uL (ref 1.4–7.7)
Neutrophils Relative %: 61.4 % (ref 43.0–77.0)
Platelets: 250 10*3/uL (ref 150.0–400.0)
RBC: 4.75 Mil/uL (ref 3.87–5.11)
RDW: 12.8 % (ref 11.5–15.5)
WBC: 6.3 10*3/uL (ref 4.0–10.5)

## 2019-02-24 LAB — COMPREHENSIVE METABOLIC PANEL
ALT: 19 U/L (ref 0–35)
AST: 20 U/L (ref 0–37)
Albumin: 4.3 g/dL (ref 3.5–5.2)
Alkaline Phosphatase: 94 U/L (ref 39–117)
BUN: 12 mg/dL (ref 6–23)
CO2: 26 mEq/L (ref 19–32)
Calcium: 9.5 mg/dL (ref 8.4–10.5)
Chloride: 104 mEq/L (ref 96–112)
Creatinine, Ser: 0.68 mg/dL (ref 0.40–1.20)
GFR: 86.17 mL/min (ref >60.00)
Glucose, Bld: 98 mg/dL (ref 70–99)
Potassium: 3.8 mEq/L (ref 3.5–5.1)
Sodium: 138 mEq/L (ref 135–145)
Total Bilirubin: 0.6 mg/dL (ref 0.2–1.2)
Total Protein: 6.6 g/dL (ref 6.0–8.3)

## 2019-02-24 LAB — H. PYLORI BREATH TEST: H. pylori Breath Test: NOT DETECTED

## 2019-02-24 LAB — AMYLASE: Amylase: 53 U/L (ref 27–131)

## 2019-02-24 LAB — LIPASE: Lipase: 18 U/L (ref 11.0–59.0)

## 2019-03-03 NOTE — Progress Notes (Signed)
Subjective:    Patient ID: Brenda Lara, female    DOB: 10-10-1950, 68 y.o.   MRN: [id number]  HPI  Virtual Visit via Video Note  I connected with Brenda Lara on 03/03/19 at  8:20 AM EDT by a video enabled telemedicine application and verified that I am speaking with the correct person using two identifiers.  Location: Patient: home Provider: office   I discussed the limitations of evaluation and management by telemedicine and the availability of in person appointments. The patient expressed understanding and agreed to proceed.  History of Present Illness:  Pt on last visit reported below.  Last visit had  upset stomach over past 2-3 weeks. She has decreased appetite and nausea if she eats. This was constant daily pain. Had sour taste to her mouth. Was not belching any. Pt had rare occasional cought. Pt states had gerd in past but about 2 years ago stopped her ppi. Pt did take 40 mg omeprazole of her husbands and it did not help. Tried 7 day course about one week ago. Pt has tried tums and pepto bismol but none in past 48 hours or more.   Day before last visit after shower she stated moderate to severe light headed briefly after shower. Felt like was about to pass out but did not. Feeling of presynope resloved quickly. But slight light headed afterwords for 2-3 hours. That has resolved by time I saw her. At time she felt dizzy she checked bp and was 163/108. Before she came her her bp was 123/60  She states feels much better. She just got back in town. Just got back from beach. She states stomach pain has calmed down. Combo pepcid and omeprazole controlling symptoms. Nausea also resolved.  No further light headed sensation since last visit.  Bp well controlled today. No cardiac or neurologic signs or symptoms.     Observations/Objective: General-no acute distress, pleasant, oriented. Lungs- on inspection lungs appear unlabored. Neck- no tracheal deviation or jvd on  inspection. Neuro- gross motor function appears intact.   Assessment and Plan:  It has resolved reflux/GERD type symptoms since adding the Pepcid and continuing with the omeprazole.  Advised her to continue both medications.  If she has breakthrough symptoms despite this regimen then explained would probably refer her back to her gastroenterologist as they might consider doing EGD.  5 years ago her EGD looked overall good.  She is getting a colonoscopy in the near future and she will mention to GI that she is now taking Pepcid.  Patient no longer has any lightheadedness or any near syncopal episodes.  She indicates that her symptoms cleared completely and never returned since last visit.  Patient's blood pressure was high on last visit as well but since last visit her blood pressure has been tightly controlled/very good range.  Follow-up in 3 months with PCP in office or virtual depending on PCP preference.  As needed as well.    Follow Up Instructions:    I discussed the assessment and treatment plan with the patient. The patient was provided an opportunity to ask questions and all were answered. The patient agreed with the plan and demonstrated an understanding of the instructions.   The patient was advised to call back or seek an in-person evaluation if the symptoms worsen or if the condition fails to improve as anticipated.  I provided 15 minutes of non-face-to-face time during this encounter.   Mackie Pai, PA-C   Review of Systems  Constitutional: Negative for chills, fatigue and fever.  HENT: Negative for congestion, postnasal drip, sinus pressure, sinus pain and sore throat.   Respiratory: Negative for cough, choking, shortness of breath and wheezing.   Cardiovascular: Negative for chest pain and palpitations.  Gastrointestinal: Negative for abdominal pain.  Endocrine: Negative for polydipsia, polyphagia and polyuria.  Genitourinary: Negative for dysuria.   Musculoskeletal: Negative for back pain, myalgias and neck stiffness.  Skin: Negative for rash.  Neurological: Negative for dizziness, tremors, syncope, weakness and light-headedness.  Hematological: Negative for adenopathy. Does not bruise/bleed easily.  Psychiatric/Behavioral: Negative for behavioral problems.    Past Medical History:  Diagnosis Date  . Anemia   . Angina pectoris   . Anxiety   . Cervical cancer screening 01/31/2016   Menarche at 12 Regular and moderate flow No history of abnormal pap in past G1P1, s/p 1 SVD No history of abnormal MGM No concerns today gyn surgeries b/l tubal ligation and b/y oophorectomy  . Colon cancer ([place]) 2010  . Coronary artery disease 2001/2005   S/P stenting to RCA and LAD  . GERD (gastroesophageal reflux disease)   . Hearing loss 08/05/2015  . History of chicken pox   . HTN (hypertension)   . Menopause   . Preventative health care 01/31/2016  . Pure hypercholesterolemia   . Vitamin D deficiency 06/01/2017     Social History   Socioeconomic History  . Marital status: Married    Spouse name: Not on file  . Number of children: Not on file  . Years of education: Not on file  . Highest education level: Not on file  Occupational History  . Not on file  Social Needs  . Financial resource strain: Not on file  . Food insecurity    Worry: Not on file    Inability: Not on file  . Transportation needs    Medical: Not on file    Non-medical: Not on file  Tobacco Use  . Smoking status: Never Smoker  . Smokeless tobacco: Never Used  Substance and Sexual Activity  . Alcohol use: No    Alcohol/week: 1.0 standard drinks    Types: 1 Standard drinks or equivalent per week  . Drug use: No  . 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 Physical activity    Days per week: Not on file    Minutes per session: Not on file  . Stress:  Not on file  Relationships  . Social Herbalist on phone: Not on file    Gets together: Not on file    Attends religious service: Not on file    Active member of club or organization: Not on file    Attends meetings of clubs or organizations: Not on file    Relationship status: Not on file  . Intimate partner violence    Fear of current or ex partner: Not on file    Emotionally abused: Not on file    Physically abused: Not on file    Forced sexual activity: Not on file  Other Topics Concern  . Not on file  Social History Narrative  . Not on file    Past Surgical History:  Procedure Laterality Date  . APPENDECTOMY  02/21/2009  . [place]   multiple  . BILATERAL OOPHORECTOMY  2010  . CHOLECYSTECTOMY  11/09  . COLON SURGERY  2010   right colectomy  . COLONOSCOPY  01/26/2014   NORMAL   . CORONARY STENT PLACEMENT  2001/2005   RCA  . CYSTECTOMY     left ovary    Family History  Problem Relation Age of Onset  . Hypertension Mother   . Transient ischemic attack Mother   . Heart attack Mother        age 13  . Heart disease Mother   . Leukemia Mother   . Heart attack Father        75's  . Cancer Father        prostate  . Heart disease Father   . Alcohol abuse Father   . Arthritis Father        mva  . Thyroid disease Daughter   . Coronary artery disease Other   . Colon cancer Neg Hx   . Pancreatic cancer Neg Hx   . Rectal cancer Neg Hx   . Stomach cancer Neg Hx     No Known Allergies  Current Outpatient Medications on File Prior to Visit  Medication Sig Dispense Refill  . aspirin 325 MG tablet Take 1 tablet (325 mg total) by mouth daily.    Marland Kitchen atenolol (TENORMIN) 50 MG tablet TAKE 1 TABLET BY MOUTH TWICE A DAY 180 tablet 3  . atorvastatin (LIPITOR) 40 MG tablet TAKE 1 TABLET BY MOUTH EVERY DAY 90 tablet 1  . baclofen (LIORESAL) 10 MG tablet Take 10 mg by mouth daily as needed for muscle spasms.     . famotidine (PEPCID) 20 MG tablet  Take 1 tablet (20 mg total) by mouth daily. 30 tablet 0  . Ferrous Fumarate-Folic Acid (HEMATINIC/FOLIC ACID) 983-3 MG TABS Take 1 tablet by mouth daily. 90 each 3  . irbesartan (AVAPRO) 75 MG tablet TAKE 1 TABLET BY MOUTH EVERY DAY 90 tablet 1  . meclizine (ANTIVERT) 25 MG tablet Take 1 tablet (25 mg total) by mouth 3 (three) times daily as needed for dizziness. 90 tablet 0  . nitroGLYCERIN (NITROSTAT) 0.4 MG SL tablet Place 1 tablet (0.4 mg total) under the tongue every 5 (five) minutes as needed for chest pain. 25 tablet 4  . omeprazole (PRILOSEC) 20 MG capsule 1-2 tab po q day 60 capsule 3  . ondansetron (ZOFRAN ODT) 4 MG disintegrating tablet Take 1 tablet (4 mg total) by mouth every 8 (eight) hours as needed for nausea or vomiting. 20 tablet 0  . topiramate (TOPAMAX) 100 MG tablet Take 100 mg by mouth daily.     . vitamin C (ASCORBIC ACID) 500 MG tablet Take 500 mg by mouth daily.    . ranitidine (ZANTAC) 300 MG tablet Take 1 tablet (300 mg total) by mouth at bedtime as needed for heartburn. (Patient not taking: Reported on 01/17/2019) 30 tablet 3   No current facility-administered medications on file prior to visit.     BP 127/70   Pulse 65       Objective:   Physical Exam        Assessment & Plan:

## 2019-03-04 ENCOUNTER — Encounter: Payer: Self-pay | Admitting: Medical

## 2019-03-04 ENCOUNTER — Other Ambulatory Visit: Payer: Self-pay

## 2019-03-04 ENCOUNTER — Ambulatory Visit (INDEPENDENT_AMBULATORY_CARE_PROVIDER_SITE_OTHER): Payer: Medicare Other | Admitting: Medical

## 2019-03-04 VITALS — BP 127/70 | HR 65

## 2019-03-04 DIAGNOSIS — K219 Gastro-esophageal reflux disease without esophagitis: Secondary | ICD-10-CM

## 2019-03-04 NOTE — Patient Instructions (Addendum)
It has resolved reflux/GERD type symptoms since adding the Pepcid and continuing with the omeprazole.  Advised her to continue both medications.  If she has breakthrough symptoms despite this regimen then explained would probably refer her back to her gastroenterologist as they might consider doing EGD.  5 years ago her EGD looked overall good.  She is getting a colonoscopy in the near future and she will mention to GI that she is now taking Pepcid.  Patient no longer has any lightheadedness or any near syncopal episodes.  She indicates that her symptoms cleared completely and never returned since last visit.  Patient's blood pressure was high on last visit as well but since last visit her blood pressure has been tightly controlled/very good range.  Follow-up in 3 months with PCP in office or virtual depending on PCP preference.  As needed as well.

## 2019-03-07 ENCOUNTER — Other Ambulatory Visit: Payer: Self-pay

## 2019-03-07 ENCOUNTER — Ambulatory Visit (AMBULATORY_SURGERY_CENTER): Payer: Self-pay

## 2019-03-07 ENCOUNTER — Encounter: Payer: Self-pay | Admitting: Internal Medicine

## 2019-03-07 VITALS — Ht 61.0 in | Wt 138.0 lb

## 2019-03-07 DIAGNOSIS — Z85038 Personal history of other malignant neoplasm of large intestine: Secondary | ICD-10-CM

## 2019-03-07 MED ORDER — NA SULFATE-K SULFATE-MG SULF 17.5-3.13-1.6 GM/177ML PO SOLN: 1.0000 | Freq: Once | ORAL | 0 refills | Status: AC

## 2019-03-07 NOTE — Progress Notes (Signed)
Denies allergies to eggs or soy products. Denies complication of anesthesia or sedation. Denies use of weight loss medication. Denies use of O2.   Emmi instructions given for colonoscopy.  Pre-Visit was conducted by phone due to Covid 19. Instructions were reviewed and mailed to patients confirmed home address. Patient was encouraged to call if she had any questions or concerns regarding instructions.  

## 2019-03-11 ENCOUNTER — Telehealth: Payer: Self-pay | Admitting: Internal Medicine

## 2019-03-11 NOTE — Telephone Encounter (Signed)

## 2019-03-14 ENCOUNTER — Other Ambulatory Visit: Payer: Self-pay

## 2019-03-14 ENCOUNTER — Ambulatory Visit (AMBULATORY_SURGERY_CENTER): Payer: Medicare Other | Admitting: Internal Medicine

## 2019-03-14 ENCOUNTER — Encounter: Payer: Self-pay | Admitting: Internal Medicine

## 2019-03-14 VITALS — BP 129/70 | HR 59 | Temp 98.3°F | Resp 11 | Ht 61.0 in | Wt 138.0 lb

## 2019-03-14 DIAGNOSIS — D123 Benign neoplasm of transverse colon: Secondary | ICD-10-CM | POA: Diagnosis not present

## 2019-03-14 DIAGNOSIS — Z85038 Personal history of other malignant neoplasm of large intestine: Secondary | ICD-10-CM

## 2019-03-14 MED ORDER — SODIUM CHLORIDE 0.9 % IV SOLN: 500.0000 mL | Freq: Once | INTRAVENOUS | Status: DC

## 2019-03-14 NOTE — Patient Instructions (Signed)
Thank you for allowing [place] to participate in your care today!  Await pathology results by mail, approximately 2 weeks.  Recommend next colonoscopy in 5 years for surveillance.  Resume previous diet and medications today.    Return to your normal activities tomorrow.  YOU HAD AN ENDOSCOPIC PROCEDURE TODAY AT [facility]:   Refer to the procedure report that was given to you for any specific questions about what was found during the examination.  If the procedure report does not answer your questions, please call your gastroenterologist to clarify.  If you requested that your care partner not be given the details of your procedure findings, then the procedure report has been included in a sealed envelope for you to review at your convenience later.  YOU SHOULD EXPECT: Some feelings of bloating in the abdomen. Passage of more gas than usual.  Walking can help get rid of the air that was put into your GI tract during the procedure and reduce the bloating. If you had a lower endoscopy (such as a colonoscopy or flexible sigmoidoscopy) you may notice spotting of blood in your stool or on the toilet paper. If you underwent a bowel prep for your procedure, you may not have a normal bowel movement for a few days.  Please Note:  You might notice some irritation and congestion in your nose or some drainage.  This is from the oxygen used during your procedure.  There is no need for concern and it should clear up in a day or so.  SYMPTOMS TO REPORT IMMEDIATELY:   Following lower endoscopy (colonoscopy or flexible sigmoidoscopy):  Excessive amounts of blood in the stool  Significant tenderness or worsening of abdominal pains  Swelling of the abdomen that is new, acute  Fever of 100F or higher   Black, tarry-looking stools  For urgent or emergent issues, a gastroenterologist can be reached at any hour by calling [phone].   DIET:  We do recommend a small meal at first, but then  you may proceed to your regular diet.  Drink plenty of fluids but you should avoid alcoholic beverages for 24 hours.  ACTIVITY:  You should plan to take it easy for the rest of today and you should NOT DRIVE or use heavy machinery until tomorrow (because of the sedation medicines used during the test).    FOLLOW UP: Our staff will call the number listed on your records 48-72 hours following your procedure to check on you and address any questions or concerns that you may have regarding the information given to you following your procedure. If we do not reach you, we will leave a message.  We will attempt to reach you two times.  During this call, we will ask if you have developed any symptoms of COVID 19. If you develop any symptoms (ie: fever, flu-like symptoms, shortness of breath, cough etc.) before then, please call [phone].  If you test positive for Covid 19 in the 2 weeks post procedure, please call and report this information to [place].    If any biopsies were taken you will be contacted by phone or by letter within the next 1-3 weeks.  Please call us at [phone] if you have not heard about the biopsies in 3 weeks.    SIGNATURES/CONFIDENTIALITY: You and/or your care partner have signed paperwork which will be entered into your electronic medical record.  These signatures attest to the fact that that the information above on your After Visit  Summary has been reviewed and is understood.  Full responsibility of the confidentiality of this discharge information lies with you and/or your care-partner. 

## 2019-03-14 NOTE — Progress Notes (Signed)
Pt's states no medical or surgical changes since previsit or office visit. 

## 2019-03-14 NOTE — Op Note (Signed)
[facility] Patient Name: Brenda Lara Procedure Date: 03/14/2019 2:32 PM MRN: [id number] Endoscopist: Docia Chuck. Henrene Pastor , MD Age: 68 Referring MD:  Date of Birth: 27-Jul-1951 Gender: Female Account #: [id number] Procedure:                Colonoscopy with cold snare polypectomy x 1 Indications:              High risk colon cancer surveillance: Personal                            history of colon cancer. Status post right                            hemicolectomy for colon cancer elsewhere 2010.                            Follow-up examinations 2012 and 2015 both negative                            for neoplasia Medicines:                Monitored Anesthesia Care Procedure:                Pre-Anesthesia Assessment:                           - Prior to the procedure, a History and Physical                            was performed, and patient medications and                            allergies were reviewed. The patient's tolerance of                            previous anesthesia was also reviewed. The risks                            and benefits of the procedure and the sedation                            options and risks were discussed with the patient.                            All questions were answered, and informed consent                            was obtained. Prior Anticoagulants: The patient has                            taken no previous anticoagulant or antiplatelet                            agents. ASA Grade Assessment: II - A patient with  mild systemic disease. After reviewing the risks                            and benefits, the patient was deemed in                            satisfactory condition to undergo the procedure.                           After obtaining informed consent, the colonoscope                            was passed under direct vision. Throughout the                            procedure, the patient's blood  pressure, pulse, and                            oxygen saturations were monitored continuously. The                            Colonoscope was introduced through the anus and                            advanced to the the ileocolonic anastomosis. The                            rectum was photographed. The quality of the bowel                            preparation was excellent. The colonoscopy was                            performed without difficulty. The patient tolerated                            the procedure well. The bowel preparation used was                            SUPREP via split dose instruction. Scope In: 2:39:04 PM Scope Out: 2:48:15 PM Scope Withdrawal Time: 0 hours 4 minutes 11 seconds  Total Procedure Duration: 0 hours 9 minutes 11 seconds  Findings:                 A 3 mm polyp was found in the transverse colon. The                            polyp was removed with a cold snare. Resection and                            retrieval were complete.                           Status post right hemicolectomy with mild  ulceration at the anastomosis. The exam was                            otherwise without abnormality on direct and                            retroflexion views. Complications:            No immediate complications. Estimated blood loss:                            None. Estimated Blood Loss:     Estimated blood loss: none. Impression:               - One 3 mm polyp in the transverse colon, removed                            with a cold snare. Resected and retrieved.                           -Status post right hemicolectomy. The examination                            was otherwise normal on direct and retroflexion                            views. Recommendation:           - Repeat colonoscopy in 5 years for surveillance.                           - Patient has a contact number available for                            emergencies.  The signs and symptoms of potential                            delayed complications were discussed with the                            patient. Return to normal activities tomorrow.                            Written discharge instructions were provided to the                            patient.                           - Resume previous diet.                           - Continue present medications.                           - Await pathology results. Docia Chuck. Henrene Pastor, MD 03/14/2019 2:58:11 PM This report has been signed electronically.

## 2019-03-14 NOTE — Progress Notes (Signed)
Report to PACU, RN, vss, BBS= Clear.  

## 2019-03-16 ENCOUNTER — Telehealth: Payer: Self-pay

## 2019-03-16 NOTE — Telephone Encounter (Signed)
  Follow up Call-  Call back number 03/14/2019  Post procedure Call Back phone  # [phone]  Permission to leave phone message Yes  Some recent data might be hidden     Patient questions:  Do you have a fever, pain , or abdominal swelling? No. Pain Score  0 *  Have you tolerated food without any problems? Yes.    Have you been able to return to your normal activities? Yes.    Do you have any questions about your discharge instructions: Diet   No. Medications  No. Follow up visit  No.  Do you have questions or concerns about your Care? No.  Actions: * If pain score is 4 or above: No action needed, pain <4. 1. Have you developed a fever since your procedure? no  2.   Have you had an respiratory symptoms (SOB or cough) since your procedure? no  3.   Have you tested positive for COVID 19 since your procedure no  4.   Have you had any family members/close contacts diagnosed with the COVID 19 since your procedure?  no   If yes to any of these questions please route to Joylene John, RN and Alphonsa Gin, Therapist, sports.

## 2019-03-17 ENCOUNTER — Encounter: Payer: Self-pay | Admitting: Internal Medicine

## 2019-04-06 ENCOUNTER — Other Ambulatory Visit (HOSPITAL_BASED_OUTPATIENT_CLINIC_OR_DEPARTMENT_OTHER): Payer: Self-pay | Admitting: Family Medicine

## 2019-04-06 DIAGNOSIS — Z1231 Encounter for screening mammogram for malignant neoplasm of breast: Secondary | ICD-10-CM

## 2019-04-12 DIAGNOSIS — Z0100 Encounter for examination of eyes and vision without abnormal findings: Secondary | ICD-10-CM | POA: Diagnosis not present

## 2019-04-15 ENCOUNTER — Telehealth: Payer: Self-pay | Admitting: Internal Medicine

## 2019-04-15 NOTE — Telephone Encounter (Signed)
Pt reported a bad after taste in mouth regardless of what she eats.  She stated that omeprazole has bad the taste worse and makes her nauseous.  Please call back to advise

## 2019-04-15 NOTE — Telephone Encounter (Signed)
Pt has already scheduled appt with Dr. Henrene Pastor and will keep that appt.

## 2019-04-19 DIAGNOSIS — G43019 Migraine without aura, intractable, without status migrainosus: Secondary | ICD-10-CM | POA: Diagnosis not present

## 2019-04-19 DIAGNOSIS — G43719 Chronic migraine without aura, intractable, without status migrainosus: Secondary | ICD-10-CM | POA: Diagnosis not present

## 2019-04-20 ENCOUNTER — Ambulatory Visit (HOSPITAL_BASED_OUTPATIENT_CLINIC_OR_DEPARTMENT_OTHER)
Admission: RE | Admit: 2019-04-20 | Discharge: 2019-04-20 | Disposition: A | Discharge status: Home / Self Care | Payer: Medicare Other | Source: Ambulatory Visit | Attending: Family Medicine | Admitting: Family Medicine

## 2019-04-20 ENCOUNTER — Other Ambulatory Visit: Payer: Self-pay

## 2019-04-20 DIAGNOSIS — Z1231 Encounter for screening mammogram for malignant neoplasm of breast: Secondary | ICD-10-CM

## 2019-04-28 ENCOUNTER — Other Ambulatory Visit: Payer: Self-pay | Admitting: Cardiovascular Disease

## 2019-05-16 ENCOUNTER — Other Ambulatory Visit: Payer: Self-pay

## 2019-05-16 ENCOUNTER — Encounter: Payer: Self-pay | Admitting: Internal Medicine

## 2019-05-16 ENCOUNTER — Ambulatory Visit (INDEPENDENT_AMBULATORY_CARE_PROVIDER_SITE_OTHER): Payer: Medicare Other | Admitting: Internal Medicine

## 2019-05-16 VITALS — BP 138/80 | HR 70 | Temp 97.6°F | Ht 61.0 in | Wt 141.0 lb

## 2019-05-16 DIAGNOSIS — B370 Candidal stomatitis: Secondary | ICD-10-CM | POA: Diagnosis not present

## 2019-05-16 DIAGNOSIS — K219 Gastro-esophageal reflux disease without esophagitis: Secondary | ICD-10-CM

## 2019-05-16 DIAGNOSIS — R1013 Epigastric pain: Secondary | ICD-10-CM | POA: Diagnosis not present

## 2019-05-16 DIAGNOSIS — R432 Parageusia: Secondary | ICD-10-CM

## 2019-05-16 MED ORDER — CLOTRIMAZOLE 10 MG MT TROC: 10.0000 mg | Freq: Every day | OROMUCOSAL | 0 refills | Status: DC

## 2019-05-16 MED ORDER — OMEPRAZOLE 40 MG PO CPDR: 40.0000 mg | DELAYED_RELEASE_CAPSULE | Freq: Every day | ORAL | 3 refills | Status: DC

## 2019-05-16 NOTE — Patient Instructions (Signed)
We have sent the following medications to your pharmacy for you to pick up at your convenience:  Omeprazole, Mycelex Troche  You can take Pepcid at night.  You have been scheduled for an endoscopy. Please follow written instructions given to you at your visit today. If you use inhalers (even only as needed), please bring them with you on the day of your procedure.

## 2019-05-16 NOTE — Progress Notes (Signed)
HISTORY OF PRESENT ILLNESS:  Brenda Lara is a 67 y.o. female with past medical history as listed below who presents today with chief complaint of reflux symptoms as well as persistent bad taste in her mouth.  I last saw the patient March 14, 2023 routine colon cancer surveillance.  She does have a history of colon cancer and underwent right hemicolectomy elsewhere in 2010.  Her most recent examination revealed 1 adenomatous colon polyp measuring 3 mm which was removed.  No other abnormalities.  Follow-up 5 years recommended.  Patient tells me that in May she began to develop epigastric burning as well as a bad taste in her mouth.  She was diagnosed with GERD.  She was prescribed omeprazole 20 mg daily and famotidine 20 mg.  She has been taking omeprazole daily but not the famotidine.  She says that the burning discomfort has improved though she will have occasional breakthrough symptoms with symptoms into her throat for which she will take an additional omeprazole.  No dysphasia.  Last underwent upper endoscopy in June 2015 to evaluate iron deficiency anemia.  Examination revealed atrophic gastric mucosa with ultrasound was normal.  Patient describes a persistent worsening bitter or metallic taste in her mouth for the past 3 months.  Sometimes she does describe some discomfort on her tongue.  She denies recent exposure to prednisone or antibiotics.  None of her medications are new over the past 6 months.  She will experience occasional nausea and belching.  GI review of systems is otherwise negative.  X-ray file shows CT scan from 2015 without acute findings of the abdomen or pelvis.  Review of laboratories from February 23, 2019 finds normal comprehensive metabolic panel and normal CBC with hemoglobin 13.6.  REVIEW OF SYSTEMS:  All non-GI ROS negative as otherwise stated in the HPI except for sinus and allergy, back pain, sore throat, fatigue, cough  Past Medical History:  Diagnosis Date  . Allergy   .  Anemia   . Angina pectoris   . Anxiety   . Cataract   . Cervical cancer screening 01/31/2016   Menarche at 12 Regular and moderate flow No history of abnormal pap in past G1P1, s/p 1 SVD No history of abnormal MGM No concerns today gyn surgeries b/l tubal ligation and b/y oophorectomy  . Colon cancer ([place]) 2010  . Coronary artery disease 2001/2005   S/P stenting to RCA and LAD  . GERD (gastroesophageal reflux disease)   . Hearing loss 08/05/2015  . History of chicken pox   . HTN (hypertension)   . Menopause   . Preventative health care 01/31/2016  . Pure hypercholesterolemia   . Vitamin D deficiency 06/01/2017    Past Surgical History:  Procedure Laterality Date  . APPENDECTOMY  02/21/2009  . [place]   multiple  . BILATERAL OOPHORECTOMY  2010  . CHOLECYSTECTOMY  11/09  . COLON SURGERY  2010   right colectomy  . COLONOSCOPY  01/26/2014   NORMAL   . CORONARY STENT PLACEMENT  2001/2005   RCA  . CYSTECTOMY     left ovary    Social History Brenda Lara  reports that she has never smoked. She has never used smokeless tobacco. She reports that she does not drink alcohol or use drugs.  family history includes Alcohol abuse in her father; Arthritis in her father; Cancer in her father; Coronary artery disease in an other family member; Heart attack in her father and mother; Heart disease  in her father and mother; Hypertension in her mother; Leukemia in her mother; Thyroid disease in her daughter; Transient ischemic attack in her mother.  No Known Allergies     PHYSICAL EXAMINATION: Vital signs: BP 138/80   Pulse 70   Temp 97.6 F (36.4 C)   Ht 5\' 1"  (1.549 m)   Wt 141 lb (64 kg)   BMI 26.64 kg/m   Constitutional: generally well-appearing, no acute distress Psychiatric: alert and oriented x3, cooperative Eyes: extraocular movements intact, anicteric, conjunctiva pink Mouth: oral pharynx moist, no lesions.  There was a whitish plaque on the tongue which  may represent Candida Neck: supple no lymphadenopathy Cardiovascular: heart regular rate and rhythm, no murmur Lungs: clear to auscultation bilaterally Abdomen: soft, nontender, nondistended, no obvious ascites, no peritoneal signs, normal bowel sounds, no organomegaly Rectal: Omitted Extremities: no clubbing, cyanosis, or lower extremity edema bilaterally Skin: no lesions on visible extremities Neuro: No focal deficits.  Cranial nerves intact  ASSESSMENT:  1.  GERD with breakthrough symptoms despite current dose of PPI. 2.  Persistent poor taste in mouth possibly related to GERD.  Possibly related to thrush 3.  Probable oral candidiasis 4.  Personal history of colon cancer and adenomatous colon polyps.  Surveillance up-to-date   PLAN:  1.  Reflux precautions 2.  Increase omeprazole from 20 mg daily to 40 mg daily.  Prescription rewritten and submitted to her pharmacy electronically. 3.  Take famotidine at bedtime 4.  Prescribe Mycelex troches 5 times daily for 7 to 10 days 5.  Schedule upper endoscopy to evaluate persistent reflux symptoms despite PPI therapy.  Rule out other pathology masquerading as GERD.The nature of the procedure, as well as the risks, benefits, and alternatives were carefully and thoroughly reviewed with the patient. Ample time for discussion and questions allowed. The patient understood, was satisfied, and agreed to proceed.

## 2019-05-18 ENCOUNTER — Telehealth: Payer: Self-pay | Admitting: *Deleted

## 2019-05-18 NOTE — Telephone Encounter (Signed)
Covid-19 screening questions   Do you now or have you had a fever in the last 14 days?  Do you have any respiratory symptoms of shortness of breath or cough now or in the last 14 days?  Do you have any family members or close contacts with diagnosed or suspected Covid-19 in the past 14 days?  Have you been tested for Covid-19 and found to be positive?  LMOM to call back if any of the above is a yes

## 2019-05-19 ENCOUNTER — Ambulatory Visit (AMBULATORY_SURGERY_CENTER): Payer: Medicare Other | Admitting: Internal Medicine

## 2019-05-19 ENCOUNTER — Encounter: Payer: Self-pay | Admitting: Internal Medicine

## 2019-05-19 ENCOUNTER — Other Ambulatory Visit: Payer: Self-pay

## 2019-05-19 VITALS — BP 125/86 | HR 55 | Temp 98.3°F | Resp 20 | Ht 61.0 in | Wt 141.0 lb

## 2019-05-19 DIAGNOSIS — R1310 Dysphagia, unspecified: Secondary | ICD-10-CM | POA: Diagnosis not present

## 2019-05-19 DIAGNOSIS — K219 Gastro-esophageal reflux disease without esophagitis: Secondary | ICD-10-CM | POA: Diagnosis not present

## 2019-05-19 DIAGNOSIS — R1013 Epigastric pain: Secondary | ICD-10-CM

## 2019-05-19 DIAGNOSIS — R432 Parageusia: Secondary | ICD-10-CM

## 2019-05-19 DIAGNOSIS — I2510 Atherosclerotic heart disease of native coronary artery without angina pectoris: Secondary | ICD-10-CM | POA: Diagnosis not present

## 2019-05-19 MED ORDER — SODIUM CHLORIDE 0.9 % IV SOLN: 500.0000 mL | Freq: Once | INTRAVENOUS | Status: DC

## 2019-05-19 NOTE — Op Note (Signed)
[facility] Patient Name: Brenda Lara Procedure Date: 05/19/2019 1:16 PM MRN: [id number] Endoscopist: Docia Chuck. Henrene Pastor , MD Age: 68 Referring MD:  Date of Birth: Dec 04, 1950 Gender: Female Account #: [id number] Procedure:                Upper GI endoscopy Indications:              Dyspepsia, Suspected gastro-esophageal reflux                            disease Medicines:                Monitored Anesthesia Care Procedure:                Pre-Anesthesia Assessment:                           - Prior to the procedure, a History and Physical                            was performed, and patient medications and                            allergies were reviewed. The patient's tolerance of                            previous anesthesia was also reviewed. The risks                            and benefits of the procedure and the sedation                            options and risks were discussed with the patient.                            All questions were answered, and informed consent                            was obtained. Prior Anticoagulants: The patient has                            taken no previous anticoagulant or antiplatelet                            agents. ASA Grade Assessment: II - A patient with                            mild systemic disease. After reviewing the risks                            and benefits, the patient was deemed in                            satisfactory condition to undergo the procedure.  After obtaining informed consent, the endoscope was                            passed under direct vision. Throughout the                            procedure, the patient's blood pressure, pulse, and                            oxygen saturations were monitored continuously. The                            Endoscope was introduced through the mouth, and                            advanced to the second part of duodenum. The upper                           GI endoscopy was accomplished without difficulty.                            The patient tolerated the procedure well. Scope In: Scope Out: Findings:                 The esophagus was normal.                           The stomach revealed atrophic mucosa but was                            otherwise normal.                           The examined duodenum was normal.                           The cardia and gastric fundus were normal on                            retroflexion. Complications:            No immediate complications. Estimated Blood Loss:     Estimated blood loss: none. Impression:               1. Unremarkable EGD                           2. GERD                           3. Dysgeusia                           4. Oral thrush. Recommendation:           1. Continue current medications                           2. Resume previous diet  3. Office follow-up with Dr. Henrene Pastor in 6 to 8 weeks. Docia Chuck. Henrene Pastor, MD 05/19/2019 1:34:40 PM This report has been signed electronically.

## 2019-05-19 NOTE — Progress Notes (Signed)
Report to PACU, RN, vss, BBS= Clear.  

## 2019-05-19 NOTE — Patient Instructions (Signed)
Read all of the handouts given to you by your recovery room nurse.  Thank-you for choosing [place] for your healthcare needs today.  YOU HAD AN ENDOSCOPIC PROCEDURE TODAY AT [facility]:   Refer to the procedure report that was given to you for any specific questions about what was found during the examination.  If the procedure report does not answer your questions, please call your gastroenterologist to clarify.  If you requested that your care partner not be given the details of your procedure findings, then the procedure report has been included in a sealed envelope for you to review at your convenience later.  YOU SHOULD EXPECT: Some feelings of bloating in the abdomen. Passage of more gas than usual.  Walking can help get rid of the air that was put into your GI tract during the procedure and reduce the bloating.   Please Note:  You might notice some irritation and congestion in your nose or some drainage.  This is from the oxygen used during your procedure.  There is no need for concern and it should clear up in a day or so.  SYMPTOMS TO REPORT IMMEDIATELY:    Following upper endoscopy (EGD)  Vomiting of blood or coffee ground material  New chest pain or pain under the shoulder blades  Painful or persistently difficult swallowing  New shortness of breath  Fever of 100F or higher  Black, tarry-looking stools  For urgent or emergent issues, a gastroenterologist can be reached at any hour by calling [phone].   DIET:  We do recommend a small meal at first, but then you may proceed to your regular diet.  Drink plenty of fluids but you should avoid alcoholic beverages for 24 hours.  ACTIVITY:  You should plan to take it easy for the rest of today and you should NOT DRIVE or use heavy machinery until tomorrow (because of the sedation medicines used during the test).    FOLLOW UP: Our staff will call the number listed on your records 48-72 hours following your  procedure to check on you and address any questions or concerns that you may have regarding the information given to you following your procedure. If we do not reach you, we will leave a message.  We will attempt to reach you two times.  During this call, we will ask if you have developed any symptoms of COVID 19. If you develop any symptoms (ie: fever, flu-like symptoms, shortness of breath, cough etc.) before then, please call [phone].  If you test positive for Covid 19 in the 2 weeks post procedure, please call and report this information to [place].     SIGNATURES/CONFIDENTIALITY: You and/or your care partner have signed paperwork which will be entered into your electronic medical record.  These signatures attest to the fact that that the information above on your After Visit Summary has been reviewed and is understood.  Full responsibility of the confidentiality of this discharge information lies with you and/or your care-partner.

## 2019-05-24 ENCOUNTER — Telehealth: Payer: Self-pay

## 2019-05-24 NOTE — Telephone Encounter (Signed)
First attempt follow up call LM on VM

## 2019-05-24 NOTE — Telephone Encounter (Signed)
  Follow up Call-  Call back number 05/19/2019 03/14/2019  Post procedure Call Back phone  # [phone]  Permission to leave phone message Yes Yes  Some recent data might be hidden     Patient questions:  Do you have a fever, pain , or abdominal swelling? No. Pain Score  0 *  Have you tolerated food without any problems? Yes.    Have you been able to return to your normal activities? Yes.    Do you have any questions about your discharge instructions: Diet   No. Medications  No. Follow up visit  No.  Do you have questions or concerns about your Care? No.  Actions: * If pain score is 4 or above: No action needed, pain <4.  1. Have you developed a fever since your procedure? no  2.   Have you had an respiratory symptoms (SOB or cough) since your procedure? no  3.   Have you tested positive for COVID 19 since your procedure no  4.   Have you had any family members/close contacts diagnosed with the COVID 19 since your procedure?  no   If yes to any of these questions please route to Joylene John, RN and Alphonsa Gin, Therapist, sports.

## 2019-05-31 NOTE — Progress Notes (Signed)
Patient ID: Brenda Lara, female   DOB: May 12, 1951, 69 y.o.   MRN: [id number]     68 y.o. with distant history of CAD. Stenting of RCA in 2006 and LAD in 2005. Last cath in 2010 stents widely patent Last ETT in 2016 was normal. Statin changed to Lipitor in February 2019 with target LDL reached. Colon cancer in remission post colectomy EF is 55% with some apical hypokinesis but has not had echo in long time.   No angina Compliant with meds  Anginal equivalent is inner arm pain  Some throbbing and pain in legs not claudication has excellent pulses Likely from some varicosities Venous duplex LLE negative for DVT March 2019  Husband retired and home more Seems to cramp her life style. Community center open now but she has not gone back Getting labs, flu and pneumonia shots on Friday   ROS: Denies fever, malais, weight loss, blurry vision, decreased visual acuity, cough, sputum, SOB, hemoptysis, pleuritic pain, palpitaitons, heartburn, abdominal pain, melena, lower extremity edema, claudication, or rash.  All other systems reviewed and negative  General: BP (!) 146/70   Pulse (!) 59   Ht 5\' 1"  (1.549 m)   Wt 143 lb (64.9 kg)   SpO2 98%   BMI 27.02 kg/m  Affect appropriate Healthy:  appears stated age 8: normal Neck supple with no adenopathy JVP normal no bruits no thyromegaly Lungs clear with no wheezing and good diaphragmatic motion Heart:  S1/S2 no murmur, no rub, gallop or click PMI normal Abdomen: benighn, BS positve, no tenderness, no AAA post colectomy  no bruit.  No HSM or HJR Distal pulses intact with no bruits No edema Neuro non-focal Skin warm and dry No muscular weakness   Current Outpatient Medications  Medication Sig Dispense Refill  . aspirin 325 MG tablet Take 1 tablet (325 mg total) by mouth daily.    Marland Kitchen atenolol (TENORMIN) 50 MG tablet TAKE 1 TABLET BY MOUTH TWICE A DAY 180 tablet 3  . atorvastatin (LIPITOR) 40 MG tablet Take 1 tablet (40 mg total) by  mouth daily. 90 tablet 3  . famotidine (PEPCID) 20 MG tablet Take 0.5-2 tablets (10-40 mg total) by mouth at bedtime as needed for heartburn or indigestion. 60 tablet 3  . irbesartan (AVAPRO) 75 MG tablet Take 1 tablet (75 mg total) by mouth daily. 90 tablet 1  . meclizine (ANTIVERT) 25 MG tablet Take 1 tablet (25 mg total) by mouth 3 (three) times daily as needed for dizziness. 90 tablet 0  . Multiple Vitamin (MULTIVITAMIN) tablet Take 1 tablet by mouth daily.    Marland Kitchen omeprazole (PRILOSEC) 40 MG capsule Take 1 capsule (40 mg total) by mouth daily. 90 capsule 3  . topiramate (TOPAMAX) 100 MG tablet Take 100 mg by mouth daily.     . clotrimazole (MYCELEX) 10 MG troche Take 1 tablet (10 mg total) by mouth 5 (five) times daily. 50 tablet 0   No current facility-administered medications for this visit.     Allergies  Patient has no known allergies.  Electrocardiogram:  01/20/14  NSR rate 67 normal ECG  01/24/15 SR rate 57  Normal ECG   02/05/16  SR rate 59 normal   Assessment and Plan  CAD:  Distant history of RCA/LAD stenting  Patent by cath in 2010.  No arm/chest pain normal ECG continue ASA and beta blocker Normal ETT  June 2016 Active no need for testing at this time New SL nitro called in   Chol:  Changed to Lipitor 10/2017 with target LDL reached  Cholesterol is at goal.  Continue current dose of statin and diet Rx.  No myalgias or side effects.  F/U  LFT's in 6 months. Lab Results  Component Value Date   LDLCALC 43 01/20/2018    Colon Cancer:  Colonoscopy normal last 2015  in remission fu GI GERD:  Continue zantac as needed Vertigo:  Infrequent has antivert as needed    [employer]

## 2019-06-02 ENCOUNTER — Ambulatory Visit: Payer: Medicare Other | Admitting: Family Medicine

## 2019-06-03 ENCOUNTER — Ambulatory Visit (INDEPENDENT_AMBULATORY_CARE_PROVIDER_SITE_OTHER): Payer: Medicare Other | Admitting: Family Medicine

## 2019-06-03 ENCOUNTER — Other Ambulatory Visit: Payer: Self-pay

## 2019-06-03 VITALS — BP 144/73 | Wt 139.5 lb

## 2019-06-03 DIAGNOSIS — E7800 Pure hypercholesterolemia, unspecified: Secondary | ICD-10-CM

## 2019-06-03 DIAGNOSIS — I10 Essential (primary) hypertension: Secondary | ICD-10-CM | POA: Diagnosis not present

## 2019-06-03 DIAGNOSIS — E559 Vitamin D deficiency, unspecified: Secondary | ICD-10-CM | POA: Diagnosis not present

## 2019-06-03 DIAGNOSIS — K219 Gastro-esophageal reflux disease without esophagitis: Secondary | ICD-10-CM

## 2019-06-03 MED ORDER — FAMOTIDINE 20 MG PO TABS: 10.0000 mg | ORAL_TABLET | Freq: Every evening | ORAL | 3 refills | Status: DC | PRN

## 2019-06-03 MED ORDER — IRBESARTAN 75 MG PO TABS: 75.0000 mg | ORAL_TABLET | Freq: Every day | ORAL | 1 refills | Status: DC

## 2019-06-03 NOTE — Progress Notes (Signed)
Virtual Visit via Video Note  I connected with Brenda Lara on 06/03/19 at  9:40 AM EDT by a video enabled telemedicine application and verified that I am speaking with the correct person using two identifiers.  Location: Patient: home Provider: home   I discussed the limitations of evaluation and management by telemedicine and the availability of in person appointments. The patient expressed understanding and agreed to proceed. [name], CMA was able to get patient set up on video visit   Subjective:    Patient ID: Brenda Lara, female    DOB: [birth year], 68 y.o.   MRN: [id number]  No chief complaint on file.   HPI Patient is in today for follow up on chronic medical concerns including hypertension, hyperlipidemia, reflux and more. Her reflux has been flared and has recently had an endoscopy with gastroenterology and has been improving on increased dosing of Omeprazole. No recent febrile illness or hospitalizations. Is maintaining quarantine well. Is trying to maintain a heart healthy diet and stay active. Denies CP/palp/SOB/HA/congestion/fevers or GU c/o. Taking meds as prescribed  Past Medical History:  Diagnosis Date  . Allergy   . Anemia   . Angina pectoris   . Anxiety   . Cataract   . Cervical cancer screening 01/31/2016   Menarche at 12 Regular and moderate flow No history of abnormal pap in past G1P1, s/p 1 SVD No history of abnormal MGM No concerns today gyn surgeries b/l tubal ligation and b/y oophorectomy  . Colon cancer ([place]) 2010  . Coronary artery disease 2001/2005   S/P stenting to RCA and LAD  . GERD (gastroesophageal reflux disease)   . Hearing loss 08/05/2015  . History of chicken pox   . HTN (hypertension)   . Menopause   . Preventative health care 01/31/2016  . Pure hypercholesterolemia   . Vitamin D deficiency 06/01/2017    Past Surgical History:  Procedure Laterality Date  . APPENDECTOMY  02/21/2009  . [place]   multiple   . BILATERAL OOPHORECTOMY  2010  . CHOLECYSTECTOMY  11/09  . COLON SURGERY  2010   right colectomy  . COLONOSCOPY  01/26/2014   NORMAL   . CORONARY STENT PLACEMENT  2001/2005   RCA  . CYSTECTOMY     left ovary    Family History  Problem Relation Age of Onset  . Hypertension Mother   . Transient ischemic attack Mother   . Heart attack Mother        age 69  . Heart disease Mother   . Leukemia Mother   . Heart attack Father        35's  . Cancer Father        prostate  . Heart disease Father   . Alcohol abuse Father   . Arthritis Father        mva  . Thyroid disease Daughter   . Coronary artery disease Other   . Colon cancer Neg Hx   . Pancreatic cancer Neg Hx   . Rectal cancer Neg Hx   . Stomach cancer Neg Hx   . Esophageal cancer Neg Hx     Social History   Socioeconomic History  . Marital status: Married    Spouse name: Not on file  . Number of children: Not on file  . Years of education: Not on file  . Highest education level: Not on file  Occupational History  . Not on file  Social Needs  . Financial  resource strain: Not on file  . Food insecurity    Worry: Not on file    Inability: Not on file  . Transportation needs    Medical: Not on file    Non-medical: Not on file  Tobacco Use  . Smoking status: Never Smoker  . Smokeless tobacco: Never Used  Substance and Sexual Activity  . Alcohol use: No    Alcohol/week: 1.0 standard drinks    Types: 1 Standard drinks or equivalent per week  . Drug use: No  . 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 Physical activity    Days per week: Not on file    Minutes per session: Not on file  . Stress: Not on file  Relationships  . Social Herbalist on phone: Not on file    Gets together: Not on file    Attends religious service: Not on file    Active member of club or organization: Not  on file    Attends meetings of clubs or organizations: Not on file    Relationship status: Not on file  . Intimate partner violence    Fear of current or ex partner: Not on file    Emotionally abused: Not on file    Physically abused: Not on file    Forced sexual activity: Not on file  Other Topics Concern  . Not on file  Social History Narrative  . Not on file    Outpatient Medications Prior to Visit  Medication Sig Dispense Refill  . aspirin 325 MG tablet Take 1 tablet (325 mg total) by mouth daily.    Marland Kitchen atenolol (TENORMIN) 50 MG tablet TAKE 1 TABLET BY MOUTH TWICE A DAY 180 tablet 3  . atorvastatin (LIPITOR) 40 MG tablet TAKE 1 TABLET BY MOUTH EVERY DAY 90 tablet 0  . clotrimazole (MYCELEX) 10 MG troche Take 1 tablet (10 mg total) by mouth 5 (five) times daily. 50 tablet 0  . Ferrous Fumarate-Folic Acid (HEMATINIC/FOLIC ACID) 99991111 MG TABS Take 1 tablet by mouth daily. (Patient not taking: Reported on 05/19/2019) 90 each 3  . meclizine (ANTIVERT) 25 MG tablet Take 1 tablet (25 mg total) by mouth 3 (three) times daily as needed for dizziness. 90 tablet 0  . Multiple Vitamin (MULTIVITAMIN) tablet Take 1 tablet by mouth daily.    Marland Kitchen omeprazole (PRILOSEC) 40 MG capsule Take 1 capsule (40 mg total) by mouth daily. 90 capsule 3  . topiramate (TOPAMAX) 100 MG tablet Take 100 mg by mouth daily.     . famotidine (PEPCID) 20 MG tablet Take 1 tablet (20 mg total) by mouth daily. 30 tablet 0  . irbesartan (AVAPRO) 75 MG tablet TAKE 1 TABLET BY MOUTH EVERY DAY 90 tablet 1  . nitroGLYCERIN (NITROSTAT) 0.4 MG SL tablet Place 1 tablet (0.4 mg total) under the tongue every 5 (five) minutes as needed for chest pain. (Patient not taking: Reported on 05/19/2019) 25 tablet 4  . ondansetron (ZOFRAN ODT) 4 MG disintegrating tablet Take 1 tablet (4 mg total) by mouth every 8 (eight) hours as needed for nausea or vomiting. (Patient not taking: Reported on 05/19/2019) 20 tablet 0   No facility-administered  medications prior to visit.     No Known Allergies  Review of Systems  Constitutional: Negative for fever and malaise/fatigue.  HENT: Negative for congestion.   Eyes: Negative for blurred  vision.  Respiratory: Negative for shortness of breath.   Cardiovascular: Negative for chest pain, palpitations and leg swelling.  Gastrointestinal: Positive for heartburn. Negative for abdominal pain, blood in stool and nausea.  Genitourinary: Negative for dysuria and frequency.  Musculoskeletal: Negative for falls.  Skin: Negative for rash.  Neurological: Negative for dizziness, loss of consciousness and headaches.  Endo/Heme/Allergies: Negative for environmental allergies.  Psychiatric/Behavioral: Negative for depression. The patient is not nervous/anxious.        Objective:    Physical Exam Constitutional:      Appearance: Normal appearance. She is not ill-appearing.  HENT:     Head: Normocephalic and atraumatic.     Nose: Nose normal.  Eyes:     General:        Right eye: No discharge.        Left eye: No discharge.  Pulmonary:     Effort: Pulmonary effort is normal.  Neurological:     Mental Status: She is alert and oriented to person, place, and time.  Psychiatric:        Mood and Affect: Mood normal.        Behavior: Behavior normal.     BP (!) 144/73 (BP Location: Left Arm, Patient Position: Sitting, Cuff Size: Normal)   Wt 139 lb 8 oz (63.3 kg)   BMI 26.36 kg/m  Wt Readings from Last 3 Encounters:  06/03/19 139 lb 8 oz (63.3 kg)  05/19/19 141 lb (64 kg)  05/16/19 141 lb (64 kg)    Diabetic Foot Exam - Simple   No data filed     Lab Results  Component Value Date   WBC 6.3 02/23/2019   HGB 13.6 02/23/2019   HCT 41.0 02/23/2019   PLT 250.0 02/23/2019   GLUCOSE 98 02/23/2019   CHOL 119 01/20/2018   TRIG 77 01/20/2018   HDL 61 01/20/2018   LDLCALC 43 01/20/2018   ALT 19 02/23/2019   AST 20 02/23/2019   NA 138 02/23/2019   K 3.8 02/23/2019   CL 104  02/23/2019   CREATININE 0.68 02/23/2019   BUN 12 02/23/2019   CO2 26 02/23/2019   TSH 1.58 06/01/2017   INR 0.95 05/13/2011    Lab Results  Component Value Date   TSH 1.58 06/01/2017   Lab Results  Component Value Date   WBC 6.3 02/23/2019   HGB 13.6 02/23/2019   HCT 41.0 02/23/2019   MCV 86.4 02/23/2019   PLT 250.0 02/23/2019   Lab Results  Component Value Date   NA 138 02/23/2019   K 3.8 02/23/2019   CO2 26 02/23/2019   GLUCOSE 98 02/23/2019   BUN 12 02/23/2019   CREATININE 0.68 02/23/2019   BILITOT 0.6 02/23/2019   ALKPHOS 94 02/23/2019   AST 20 02/23/2019   ALT 19 02/23/2019   PROT 6.6 02/23/2019   ALBUMIN 4.3 02/23/2019   CALCIUM 9.5 02/23/2019   GFR 86.17 02/23/2019   Lab Results  Component Value Date   CHOL 119 01/20/2018   Lab Results  Component Value Date   HDL 61 01/20/2018   Lab Results  Component Value Date   LDLCALC 43 01/20/2018   Lab Results  Component Value Date   TRIG 77 01/20/2018   Lab Results  Component Value Date   CHOLHDL 2.0 01/20/2018   No results found for: HGBA1C     Assessment & Plan:   Problem List Items Addressed This Visit    HYPERCHOLESTEROLEMIA    Encouraged heart healthy  diet, increase exercise, avoid trans fats, consider a krill oil cap daily      Relevant Medications   irbesartan (AVAPRO) 75 MG tablet   Other Relevant Orders   Lipid panel   Essential hypertension - Primary    Encouraged to check vitals weekly, no changes to meds. Encouraged heart healthy diet such as the DASH diet and exercise as tolerated.       Relevant Medications   irbesartan (AVAPRO) 75 MG tablet   Other Relevant Orders   CBC   Comprehensive metabolic panel   TSH   GERD    Has been flared and has recently had an endoscopy with gastroenterology and has been improving on increased dosing of Omeprazole. She can use Famotidine qhs prn as well and avoid offending foods.       Relevant Medications   famotidine (PEPCID) 20 MG tablet    Vitamin D deficiency    Supplement and monitor      Relevant Orders   VITAMIN D 25 Hydroxy (Vit-D Deficiency, Fractures)      I have discontinued Hassan Rowan A. Drier's nitroGLYCERIN and ondansetron. I have also changed her irbesartan and famotidine. Additionally, I am having her maintain her topiramate, aspirin, meclizine, Ferrous Fumarate-Folic Acid, atenolol, multivitamin, atorvastatin, omeprazole, and clotrimazole.  Meds ordered this encounter  Medications  . irbesartan (AVAPRO) 75 MG tablet    Sig: Take 1 tablet (75 mg total) by mouth daily.    Dispense:  90 tablet    Refill:  1  . famotidine (PEPCID) 20 MG tablet    Sig: Take 0.5-2 tablets (10-40 mg total) by mouth at bedtime as needed for heartburn or indigestion.    Dispense:  60 tablet    Refill:  3     I discussed the assessment and treatment plan with the patient. The patient was provided an opportunity to ask questions and all were answered. The patient agreed with the plan and demonstrated an understanding of the instructions.   The patient was advised to call back or seek an in-person evaluation if the symptoms worsen or if the condition fails to improve as anticipated.  I provided 25 minutes of non-face-to-face time during this encounter.   Penni Homans, MD

## 2019-06-03 NOTE — Assessment & Plan Note (Signed)
Has been flared and has recently had an endoscopy with gastroenterology and has been improving on increased dosing of Omeprazole. She can use Famotidine qhs prn as well and avoid offending foods.

## 2019-06-03 NOTE — Assessment & Plan Note (Signed)
Supplement and monitor 

## 2019-06-03 NOTE — Assessment & Plan Note (Signed)
Encouraged heart healthy diet, increase exercise, avoid trans fats, consider a krill oil cap daily 

## 2019-06-03 NOTE — Assessment & Plan Note (Signed)
Encouraged to check vitals weekly, no changes to meds. Encouraged heart healthy diet such as the DASH diet and exercise as tolerated.  

## 2019-06-07 ENCOUNTER — Encounter: Payer: Self-pay | Admitting: Family Medicine

## 2019-06-13 ENCOUNTER — Encounter: Payer: Self-pay | Admitting: Cardiovascular Disease

## 2019-06-13 ENCOUNTER — Other Ambulatory Visit: Payer: Self-pay

## 2019-06-13 ENCOUNTER — Ambulatory Visit (INDEPENDENT_AMBULATORY_CARE_PROVIDER_SITE_OTHER): Payer: Medicare Other | Admitting: Cardiovascular Disease

## 2019-06-13 VITALS — BP 146/70 | HR 59 | Ht 61.0 in | Wt 143.0 lb

## 2019-06-13 DIAGNOSIS — I2510 Atherosclerotic heart disease of native coronary artery without angina pectoris: Secondary | ICD-10-CM

## 2019-06-13 MED ORDER — ATORVASTATIN CALCIUM 40 MG PO TABS: 40.0000 mg | ORAL_TABLET | Freq: Every day | ORAL | 3 refills | Status: DC

## 2019-06-13 NOTE — Patient Instructions (Addendum)
Medication Instructions:   If you need a refill on your cardiac medications before your next appointment, please call your pharmacy.   Lab work:  If you have labs (blood work) drawn today and your tests are completely normal, you will receive your results only by: Marland Kitchen MyChart Message (if you have MyChart) OR . A paper copy in the mail If you have any lab test that is abnormal or we need to change your treatment, we will call you to review the results.  Testing/Procedures: NONE ordered today.  Follow-Up: At [facility], you and your health needs are our priority.  As part of our continuing mission to provide you with exceptional heart care, we have created designated Provider Care Teams.  These Care Teams include your primary Cardiologist (physician) and Advanced Practice Providers (APPs -  Physician Assistants and Nurse Practitioners) who all work together to provide you with the care you need, when you need it. You will need a follow up appointment in 12 months.  Please call our office 2 months in advance to schedule this appointment.  You may see Dr. Johnsie Cancel or one of the following Advanced Practice Providers on your designated Care Team:   Truitt Merle, NP Cecilie Kicks, NP . Kathyrn Drown, NP

## 2019-06-17 ENCOUNTER — Ambulatory Visit (INDEPENDENT_AMBULATORY_CARE_PROVIDER_SITE_OTHER): Payer: Medicare Other

## 2019-06-17 ENCOUNTER — Other Ambulatory Visit: Payer: Self-pay

## 2019-06-17 ENCOUNTER — Other Ambulatory Visit (INDEPENDENT_AMBULATORY_CARE_PROVIDER_SITE_OTHER): Payer: Medicare Other

## 2019-06-17 DIAGNOSIS — E559 Vitamin D deficiency, unspecified: Secondary | ICD-10-CM

## 2019-06-17 DIAGNOSIS — Z23 Encounter for immunization: Secondary | ICD-10-CM | POA: Diagnosis not present

## 2019-06-17 DIAGNOSIS — I10 Essential (primary) hypertension: Secondary | ICD-10-CM | POA: Diagnosis not present

## 2019-06-17 DIAGNOSIS — E7800 Pure hypercholesterolemia, unspecified: Secondary | ICD-10-CM | POA: Diagnosis not present

## 2019-06-17 LAB — COMPREHENSIVE METABOLIC PANEL
ALT: 18 U/L (ref 0–35)
AST: 19 U/L (ref 0–37)
Albumin: 4.1 g/dL (ref 3.5–5.2)
Alkaline Phosphatase: 94 U/L (ref 39–117)
BUN: 24 mg/dL — ABNORMAL HIGH (ref 6–23)
CO2: 24 mEq/L (ref 19–32)
Calcium: 9.1 mg/dL (ref 8.4–10.5)
Chloride: 106 mEq/L (ref 96–112)
Creatinine, Ser: 0.79 mg/dL (ref 0.40–1.20)
GFR: 72.41 mL/min (ref >60.00)
Glucose, Bld: 85 mg/dL (ref 70–99)
Potassium: 4 mEq/L (ref 3.5–5.1)
Sodium: 138 mEq/L (ref 135–145)
Total Bilirubin: 0.7 mg/dL (ref 0.2–1.2)
Total Protein: 6.4 g/dL (ref 6.0–8.3)

## 2019-06-17 LAB — LIPID PANEL
Cholesterol: 131 mg/dL (ref 0–200)
HDL: 56.3 mg/dL (ref >39.00)
LDL Cholesterol: 51 mg/dL (ref 0–99)
NonHDL: 74.8
Total CHOL/HDL Ratio: 2
Triglycerides: 120 mg/dL (ref 0.0–149.0)
VLDL: 24 mg/dL (ref 0.0–40.0)

## 2019-06-17 LAB — CBC
HCT: 38.7 % (ref 36.0–46.0)
Hemoglobin: 12.7 g/dL (ref 12.0–15.0)
MCHC: 32.7 g/dL (ref 30.0–36.0)
MCV: 88.6 fl (ref 78.0–100.0)
Platelets: 263 10*3/uL (ref 150.0–400.0)
RBC: 4.37 Mil/uL (ref 3.87–5.11)
RDW: 13.2 % (ref 11.5–15.5)
WBC: 6.3 10*3/uL (ref 4.0–10.5)

## 2019-06-17 LAB — TSH: TSH: 3.76 u[IU]/mL (ref 0.35–4.50)

## 2019-06-17 LAB — VITAMIN D 25 HYDROXY (VIT D DEFICIENCY, FRACTURES): VITD: 29.94 ng/mL — ABNORMAL LOW (ref 30.00–100.00)

## 2019-06-17 NOTE — Progress Notes (Signed)
Patient here today for immunizations flu shot and pneumonia vaccine. 0.5 mL flu vaccine given in left deltoid IM. 0.75mL pneumovax 23 given in right deltoid IM. Patient tolerated well.

## 2019-07-07 ENCOUNTER — Encounter: Payer: Self-pay | Admitting: Internal Medicine

## 2019-07-07 ENCOUNTER — Ambulatory Visit (INDEPENDENT_AMBULATORY_CARE_PROVIDER_SITE_OTHER): Payer: Medicare Other | Admitting: Internal Medicine

## 2019-07-07 ENCOUNTER — Other Ambulatory Visit: Payer: Self-pay

## 2019-07-07 VITALS — BP 138/64 | HR 76 | Temp 97.8°F | Ht 61.0 in | Wt 142.6 lb

## 2019-07-07 DIAGNOSIS — K219 Gastro-esophageal reflux disease without esophagitis: Secondary | ICD-10-CM | POA: Diagnosis not present

## 2019-07-07 DIAGNOSIS — B370 Candidal stomatitis: Secondary | ICD-10-CM | POA: Diagnosis not present

## 2019-07-07 MED ORDER — CLOTRIMAZOLE 10 MG MT TROC: 10.0000 mg | Freq: Every day | OROMUCOSAL | 2 refills | Status: DC

## 2019-07-07 NOTE — Progress Notes (Signed)
HISTORY OF PRESENT ILLNESS:  Brenda Lara is a 68 y.o. female with a history of colon cancer status post right hemicolectomy elsewhere 2010, and GERD.  She was last evaluated in this office May 16, 2019 regarding GERD with breakthrough symptoms despite omeprazole 20 mg daily and persistent poor taste in her mouth.  She was noted to have probable oral candidiasis.  She subsequently underwent upper endoscopy May 19, 2019.  This was normal.  She presents today for follow-up.  On her adjusted GERD regimen of omeprazole 40 mg daily and famotidine 20 mg at night, her reflux symptoms have resolved.  She did tell me that Mycelex troches did resolve some of the symptoms of burning and bad taste in her mouth.  However she did tell me that she has had some recurrence of the symptoms.  She also describes a dry throat.  She wonders if it may be related to mask wearing.  GI review of systems is otherwise negative.  Her last surveillance colonoscopy was performed April 13, 2019.  She was found to have a diminutive adenoma.  Follow-up in 5 years recommended.  Review of interval blood work from May 18, 2019 finds unremarkable comprehensive metabolic panel, lipid profile, and CBC with hemoglobin 12.7.  Normal TSH.  REVIEW OF SYSTEMS:  All non-GI ROS negative unless otherwise stated in the HPI except for cough  Past Medical History:  Diagnosis Date  . Allergy   . Anemia   . Angina pectoris   . Anxiety   . Cataract   . Cervical cancer screening 01/31/2016   Menarche at 12 Regular and moderate flow No history of abnormal pap in past G1P1, s/p 1 SVD No history of abnormal MGM No concerns today gyn surgeries b/l tubal ligation and b/y oophorectomy  . Colon cancer ([place]) 2010  . Coronary artery disease 2001/2005   S/P stenting to RCA and LAD  . GERD (gastroesophageal reflux disease)   . Hearing loss 08/05/2015  . History of chicken pox   . HTN (hypertension)   . Menopause   . Preventative health care  01/31/2016  . Pure hypercholesterolemia   . Vitamin D deficiency 06/01/2017    Past Surgical History:  Procedure Laterality Date  . APPENDECTOMY  02/21/2009  . [place]   multiple  . BILATERAL OOPHORECTOMY  2010  . CHOLECYSTECTOMY  11/09  . COLON SURGERY  2010   right colectomy  . COLONOSCOPY  01/26/2014   NORMAL   . CORONARY STENT PLACEMENT  2001/2005   RCA  . CYSTECTOMY     left ovary    Social History SABENA GISMONDI  reports that she has never smoked. She has never used smokeless tobacco. She reports that she does not drink alcohol or use drugs.  family history includes Alcohol abuse in her father; Arthritis in her father; Coronary artery disease in an other family member; Heart attack in her father and mother; Heart disease in her father and mother; Hypertension in her mother; Leukemia in her mother; Prostate cancer in her father; Thyroid disease in her daughter; Transient ischemic attack in her mother.  No Known Allergies     PHYSICAL EXAMINATION: Vital signs: BP 138/64   Pulse 76   Temp 97.8 F (36.6 C)   Ht 5\' 1"  (1.549 m)   Wt 142 lb 9.6 oz (64.7 kg)   BMI 26.94 kg/m   Constitutional: generally well-appearing, no acute distress Psychiatric: alert and oriented x3, cooperative Eyes: extraocular movements  intact, anicteric, conjunctiva pink Mouth: oral pharynx moist, no lesions.  She may have some mild thrush on the posterior tongue Neck: supple no lymphadenopathy Cardiovascular: heart regular rate and rhythm, no murmur Lungs: clear to auscultation bilaterally Abdomen: soft, nontender, nondistended, no obvious ascites, no peritoneal signs, normal bowel sounds, no organomegaly Rectal: Omitted Extremities: no clubbing, cyanosis, or lower extremity edema bilaterally Skin: no lesions on visible extremities Neuro: No focal deficits.  Cranial nerves intact  ASSESSMENT:  1.  GERD.  Symptoms resolved with adjusted antireflux regimen 2.  Possible mild  residual thrush 3.  Complaints of dry throat and cough 4.  Personal history of colon cancer and adenomatous colon polyps.  Surveillance up-to-date   PLAN:  1.  Reflux precautions 2.  Continue omeprazole 40 mg daily and famotidine 20 mg at night 3.  Prescribe Mycelex troche to be taken for 1 week.  Hopefully this will clear up any residual complaints of bad taste or tongue burning 4.  Surveillance colonoscopy around June 2025 5.  If throat complaints and dysgeusia persist, consider ENT evaluation.  This can be arranged through her PCP 6.  Routine GI office follow-up as needed 25-minute spent face-to-face with the patient.  Greater than 50% of the time used for counseling regarding her above listed diagnoses and treatment recommendations.

## 2019-07-07 NOTE — Patient Instructions (Signed)
We have sent the following medications to your pharmacy for you to pick up at your convenience:  Mycelex

## 2019-08-02 ENCOUNTER — Other Ambulatory Visit: Payer: Self-pay | Admitting: Family Medicine

## 2019-08-06 DIAGNOSIS — J0100 Acute maxillary sinusitis, unspecified: Secondary | ICD-10-CM | POA: Diagnosis not present

## 2019-08-30 ENCOUNTER — Other Ambulatory Visit: Payer: Self-pay | Admitting: Family Medicine

## 2019-10-19 DIAGNOSIS — G43719 Chronic migraine without aura, intractable, without status migrainosus: Secondary | ICD-10-CM | POA: Diagnosis not present

## 2019-10-19 DIAGNOSIS — G43019 Migraine without aura, intractable, without status migrainosus: Secondary | ICD-10-CM | POA: Diagnosis not present

## 2019-12-12 ENCOUNTER — Other Ambulatory Visit: Payer: Self-pay | Admitting: Family Medicine

## 2020-01-18 NOTE — Progress Notes (Signed)
This visit is being conducted via phone call  - after an attmept to do on video chat - due to the COVID-19 pandemic. This patient has given me verbal consent via phone to conduct this visit, patient states they are participating from their home address. Some vital signs may be absent or patient reported.   Patient identification: identified by name, DOB, and current address.    Subjective:   Brenda Lara is a 69 y.o. female who presents for Medicare Annual (Subsequent) preventive examination.  Review of Systems:  Home Safety/Smoke Alarms: Feels safe in home. Smoke alarms in place.  Lives with husband. Husband still works. 1 story home. No stairs.   Female:      Mammo-  04/20/19     Dexa scan-   ordered     CCS- 03/14/19. Recall 5 yrs. Eye- Dr.Fox yearly.     Objective:     Vitals: Unable to assess. This visit is enabled though telemedicine due to Covid 19.   Advanced Directives 01/19/2020 01/17/2019 03/08/2014 01/12/2014  Does Patient Have a Medical Advance Directive? No No Patient does not have advance directive Patient does not have advance directive  Does patient want to make changes to medical advance directive? No - Patient declined - - -  Would patient like information on creating a medical advance directive? - Yes (MAU/Ambulatory/Procedural Areas - Information given) - -  Pre-existing out of facility DNR order (yellow form or pink MOST form) - - - No    Tobacco Social History   Tobacco Use  Smoking Status Never Smoker  Smokeless Tobacco Never Used     Counseling given: Not Answered   Clinical Intake: Pain : No/denies pain     Past Medical History:  Diagnosis Date   Allergy    Anemia    Angina pectoris    Anxiety    Cataract    Cervical cancer screening 01/31/2016   Menarche at 12 Regular and moderate flow No history of abnormal pap in past G1P1, s/p 1 SVD No history of abnormal MGM No concerns today gyn surgeries b/l tubal ligation and b/y oophorectomy    Colon cancer ([facility]) 2010   Coronary artery disease 2001/2005   S/P stenting to RCA and LAD   GERD (gastroesophageal reflux disease)    Hearing loss 08/05/2015   History of chicken pox    HTN (hypertension)    Menopause    Preventative health care 01/31/2016   Pure hypercholesterolemia    Vitamin D deficiency 06/01/2017   Past Surgical History:  Procedure Laterality Date   APPENDECTOMY  02/21/2009   BACK SURGERY  1978, 1988   multiple   BILATERAL OOPHORECTOMY  2010   CHOLECYSTECTOMY  11/09   COLON SURGERY  2010   right colectomy   COLONOSCOPY  01/26/2014   NORMAL    CORONARY STENT PLACEMENT  2001/2005   RCA   CYSTECTOMY     left ovary   Family History  Problem Relation Age of Onset   Hypertension Mother    Transient ischemic attack Mother    Heart attack Mother        age 31   Heart disease Mother    Leukemia Mother    Heart attack Father        60's   Heart disease Father    Alcohol abuse Father    Arthritis Father        mva   Prostate cancer Father    Thyroid disease Daughter  Coronary artery disease Other    Colon cancer Neg Hx    Pancreatic cancer Neg Hx    Rectal cancer Neg Hx    Stomach cancer Neg Hx    Esophageal cancer Neg Hx    Social History   Socioeconomic History   Marital status: Married    Spouse name: Not on file   Number of children: Not on file   Years of education: Not on file   Highest education level: Not on file  Occupational History   Not on file  Tobacco Use   Smoking status: Never Smoker   Smokeless tobacco: Never Used  Substance and Sexual Activity   Alcohol use: No   Drug use: No   Sexual activity: Yes    Birth control/protection: Post-menopausal    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, works Producer, 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, no dietary restrictions  Other Topics Concern   Not on file  Social History Narrative   Not on file   Social Determinants of Health   Financial Resource Strain: Low Risk     Difficulty of Paying Living Expenses: Not hard at all  Food Insecurity: No Food Insecurity   Worried About Charity fundraiser in the Last Year: Never true   Arboriculturist in the Last Year: Never true  Transportation Needs: No Transportation Needs   Lack of Transportation (Medical): No   Lack of Transportation (Non-Medical): No  Physical Activity:    Days of Exercise per Week:    Minutes of Exercise per Session:   Stress:    Feeling of Stress :   Social Connections:    Frequency of Communication with Friends and Family:    Frequency of Social Gatherings with Friends and Family:    Attends Religious Services:    Active Member of Clubs or Organizations:    Attends Archivist Meetings:    Marital Status:     Outpatient Encounter Medications as of 01/19/2020  Medication Sig   aspirin 325 MG tablet Take 1 tablet (325 mg total) by mouth daily.   atenolol (TENORMIN) 50 MG tablet TAKE 1 TABLET BY MOUTH TWICE A DAY   atorvastatin (LIPITOR) 40 MG tablet Take 1 tablet (40 mg total) by mouth daily.   famotidine (PEPCID) 20 MG tablet TAKE 0.5-2 TABLETS (10-40 MG TOTAL) BY MOUTH AT BEDTIME AS NEEDED FOR HEARTBURN OR INDIGESTION.   irbesartan (AVAPRO) 75 MG tablet TAKE 1 TABLET BY MOUTH EVERY DAY   meclizine (ANTIVERT) 25 MG tablet Take 1 tablet (25 mg total) by mouth 3 (three) times daily as needed for dizziness.   Multiple Vitamin (MULTIVITAMIN) tablet Take 1 tablet by mouth daily.   omeprazole (PRILOSEC) 40 MG capsule Take 1 capsule (40 mg total) by mouth daily.   topiramate (TOPAMAX) 100 MG tablet Take 100 mg by mouth daily.    [DISCONTINUED] clotrimazole (MYCELEX) 10 MG troche Take 1 tablet (10 mg total) by mouth 5 (five) times daily.   No facility-administered encounter medications on file as of 01/19/2020.    Activities of Daily Living In your present state of health, do you have any difficulty performing the following activities: 01/19/2020  Hearing? N    Vision? N  Difficulty concentrating or making decisions? N  Walking or climbing stairs? N  Dressing or bathing? N  Doing errands, shopping? N  Preparing Food and eating ? N  Using the Toilet? N  In the past six months, have you accidently leaked urine? N  Do you have problems  with loss of bowel control? N  Managing your Medications? N  Managing your Finances? N  Housekeeping or managing your Housekeeping? N  Some recent data might be hidden    Patient Care Team: Mosie Lukes, MD as PCP - General (Family Medicine) Ladell Pier, MD as Consulting Physician (Oncology)    Assessment:   This is a routine wellness examination for Isidora. Physical assessment deferred to PCP.  Exercise Activities and Dietary recommendations Current Exercise Habits: The patient does not participate in regular exercise at present, Exercise limited by: None identified Diet (meal preparation, eat out, water intake, caffeinated beverages, dairy products, fruits and vegetables): 24 hr recall Breakfast: cheerios Lunch: spaghetti Dinner:  Chipped beef over toast. Green beans. Snack: strawberries.  Goals     maintain healthy lifestyle       Fall Risk Fall Risk  01/19/2020 01/17/2019 06/01/2017  Falls in the past year? 0 0 No  Number falls in past yr: 0 - -  Injury with Fall? 0 - -   Depression Screen PHQ 2/9 Scores 01/19/2020 01/17/2019 06/01/2017  PHQ - 2 Score 0 0 0     Cognitive Function   Ad8 score reviewed for issues:  Issues making decisions:no  Less interest in hobbies / activities:no  Repeats questions, stories (family complaining):no  Trouble using ordinary gadgets (microwave, computer, phone):no  Forgets the month or year: no  Mismanaging finances: no  Remembering appts:no Daily problems with thinking and/or memory:no Ad8 score is=0       Immunization History  Administered Date(s) Administered   Fluad Quad(high Dose 65+) 06/17/2019   Influenza Split 06/27/2011    Influenza, High Dose Seasonal PF 06/17/2018   Influenza-Unspecified 06/17/2017   Moderna SARS-COVID-2 Vaccination 10/29/2019, 11/26/2019   Pneumococcal Conjugate-13 06/01/2017   Pneumococcal Polysaccharide-23 06/17/2019   Tdap 07/02/2011    Screening Tests Health Maintenance  Topic Date Due   Hepatitis C Screening  Never done   INFLUENZA VACCINE  04/15/2020   MAMMOGRAM  04/19/2020   TETANUS/TDAP  07/01/2021   COLONOSCOPY  03/13/2024   DEXA SCAN  Completed   COVID-19 Vaccine  Completed   PNA vac Low Risk Adult  Completed     Plan:    Please schedule your next medicare wellness visit with me in 1 yr.  Continue to eat heart healthy diet (full of fruits, vegetables, whole grains, lean protein, water--limit salt, fat, and sugar intake) and increase physical activity as tolerated.  Continue doing brain stimulating activities (puzzles, reading, adult coloring books, staying active) to keep memory sharp.   Bring a copy of your living will and/or healthcare power of attorney to your next office visit.   I have personally reviewed and noted the following in the patients chart:    Medical and social history  Use of alcohol, tobacco or illicit drugs   Current medications and supplements  Functional ability and status  Nutritional status  Physical activity  Advanced directives  List of other physicians  Hospitalizations, surgeries, and ER visits in previous 12 months  Vitals  Screenings to include cognitive, depression, and falls  Referrals and appointments  In addition, I have reviewed and discussed with patient certain preventive protocols, quality metrics, and best practice recommendations. A written personalized care plan for preventive services as well as general preventive health recommendations were provided to patient.     Shela Nevin, [place]  01/19/2020

## 2020-01-19 ENCOUNTER — Encounter: Payer: Self-pay | Admitting: *Deleted

## 2020-01-19 ENCOUNTER — Other Ambulatory Visit: Payer: Self-pay

## 2020-01-19 ENCOUNTER — Ambulatory Visit (INDEPENDENT_AMBULATORY_CARE_PROVIDER_SITE_OTHER): Payer: Medicare Other | Admitting: *Deleted

## 2020-01-19 DIAGNOSIS — Z780 Asymptomatic menopausal state: Secondary | ICD-10-CM | POA: Diagnosis not present

## 2020-01-19 DIAGNOSIS — Z0000 Encounter for general adult medical examination without abnormal findings: Secondary | ICD-10-CM | POA: Diagnosis not present

## 2020-01-19 NOTE — Patient Instructions (Signed)
Please schedule your next medicare wellness visit with me in 1 yr.  Continue to eat heart healthy diet (full of fruits, vegetables, whole grains, lean protein, water--limit salt, fat, and sugar intake) and increase physical activity as tolerated.  Continue doing brain stimulating activities (puzzles, reading, adult coloring books, staying active) to keep memory sharp.   Bring a copy of your living will and/or healthcare power of attorney to your next office visit.   Brenda Lara , Thank you for taking time to come for your Medicare Wellness Visit. I appreciate your ongoing commitment to your health goals. Please review the following plan we discussed and let me know if I can assist you in the future.   These are the goals we discussed: Goals    . maintain healthy lifestyle       This is a list of the screening recommended for you and due dates:  Health Maintenance  Topic Date Due  .  Hepatitis C: One time screening is recommended by Center for Disease Control  (CDC) for  adults born from [birth year] through 1965.   Never done  . Flu Shot  04/15/2020  . Mammogram  04/19/2020  . Tetanus Vaccine  07/01/2021  . Colon Cancer Screening  03/13/2024  . DEXA scan (bone density measurement)  Completed  . COVID-19 Vaccine  Completed  . Pneumonia vaccines  Completed    Preventive Care 75 Years and Older, Female Preventive care refers to lifestyle choices and visits with your health care provider that can promote health and wellness. This includes:  A yearly physical exam. This is also called an annual well check.  Regular dental and eye exams.  Immunizations.  Screening for certain conditions.  Healthy lifestyle choices, such as diet and exercise. What can I expect for my preventive care visit? Physical exam Your health care provider will check:  Height and weight. These may be used to calculate body mass index (BMI), which is a measurement that tells if you are at a healthy  weight.  Heart rate and blood pressure.  Your skin for abnormal spots. Counseling Your health care provider may ask you questions about:  Alcohol, tobacco, and drug use.  Emotional well-being.  Home and relationship well-being.  Sexual activity.  Eating habits.  History of falls.  Memory and ability to understand (cognition).  Work and work Statistician.  Pregnancy and menstrual history. What immunizations do I need?  Influenza (flu) vaccine  This is recommended every year. Tetanus, diphtheria, and pertussis (Tdap) vaccine  You may need a Td booster every 10 years. Varicella (chickenpox) vaccine  You may need this vaccine if you have not already been vaccinated. Zoster (shingles) vaccine  You may need this after age 53. Pneumococcal conjugate (PCV13) vaccine  One dose is recommended after age 68. Pneumococcal polysaccharide (PPSV23) vaccine  One dose is recommended after age 42. Measles, mumps, and rubella (MMR) vaccine  You may need at least one dose of MMR if you were born in 1957 or later. You may also need a second dose. Meningococcal conjugate (MenACWY) vaccine  You may need this if you have certain conditions. Hepatitis A vaccine  You may need this if you have certain conditions or if you travel or work in places where you may be exposed to hepatitis A. Hepatitis B vaccine  You may need this if you have certain conditions or if you travel or work in places where you may be exposed to hepatitis B. Haemophilus influenzae type b (Hib)  vaccine  You may need this if you have certain conditions. You may receive vaccines as individual doses or as more than one vaccine together in one shot (combination vaccines). Talk with your health care provider about the risks and benefits of combination vaccines. What tests do I need? Blood tests  Lipid and cholesterol levels. These may be checked every 5 years, or more frequently depending on your overall  health.  Hepatitis C test.  Hepatitis B test. Screening  Lung cancer screening. You may have this screening every year starting at age 68 if you have a 30-pack-year history of smoking and currently smoke or have quit within the past 15 years.  Colorectal cancer screening. All adults should have this screening starting at age 37 and continuing until age 26. Your health care provider may recommend screening at age 43 if you are at increased risk. You will have tests every 1-10 years, depending on your results and the type of screening test.  Diabetes screening. This is done by checking your blood sugar (glucose) after you have not eaten for a while (fasting). You may have this done every 1-3 years.  Mammogram. This may be done every 1-2 years. Talk with your health care provider about how often you should have regular mammograms.  BRCA-related cancer screening. This may be done if you have a family history of breast, ovarian, tubal, or peritoneal cancers. Other tests  Sexually transmitted disease (STD) testing.  Bone density scan. This is done to screen for osteoporosis. You may have this done starting at age 89. Follow these instructions at home: Eating and drinking  Eat a diet that includes fresh fruits and vegetables, whole grains, lean protein, and low-fat dairy products. Limit your intake of foods with high amounts of sugar, saturated fats, and salt.  Take vitamin and mineral supplements as recommended by your health care provider.  Do not drink alcohol if your health care provider tells you not to drink.  If you drink alcohol: ? Limit how much you have to 0-1 drink a day. ? Be aware of how much alcohol is in your drink. In the [place] one drink equals one 12 oz bottle of beer (355 mL), one 5 oz glass of wine (148 mL), or one 1 oz glass of hard liquor (44 mL). Lifestyle  Take daily care of your teeth and gums.  Stay active. Exercise for at least 30 minutes on 5 or more days  each week.  Do not use any products that contain nicotine or tobacco, such as cigarettes, e-cigarettes, and chewing tobacco. If you need help quitting, ask your health care provider.  If you are sexually active, practice safe sex. Use a condom or other form of protection in order to prevent STIs (sexually transmitted infections).  Talk with your health care provider about taking a low-dose aspirin or statin. What's next?  Go to your health care provider once a year for a well check visit.  Ask your health care provider how often you should have your eyes and teeth checked.  Stay up to date on all vaccines. This information is not intended to replace advice given to you by your health care provider. Make sure you discuss any questions you have with your health care provider. Document Revised: 08/26/2018 Document Reviewed: 08/26/2018 Elsevier Patient Education  2020 [employer].

## 2020-01-29 ENCOUNTER — Other Ambulatory Visit: Payer: Self-pay | Admitting: Family Medicine

## 2020-02-02 ENCOUNTER — Other Ambulatory Visit: Payer: Self-pay

## 2020-02-02 ENCOUNTER — Ambulatory Visit (HOSPITAL_BASED_OUTPATIENT_CLINIC_OR_DEPARTMENT_OTHER)
Admission: RE | Admit: 2020-02-02 | Discharge: 2020-02-02 | Disposition: A | Discharge status: Home / Self Care | Payer: Medicare Other | Source: Ambulatory Visit | Attending: Internal Medicine | Admitting: Internal Medicine

## 2020-02-02 DIAGNOSIS — Z780 Asymptomatic menopausal state: Secondary | ICD-10-CM | POA: Insufficient documentation

## 2020-02-02 DIAGNOSIS — M810 Age-related osteoporosis without current pathological fracture: Secondary | ICD-10-CM | POA: Diagnosis not present

## 2020-02-15 DIAGNOSIS — H10013 Acute follicular conjunctivitis, bilateral: Secondary | ICD-10-CM | POA: Diagnosis not present

## 2020-03-22 ENCOUNTER — Other Ambulatory Visit (HOSPITAL_BASED_OUTPATIENT_CLINIC_OR_DEPARTMENT_OTHER): Payer: Self-pay | Admitting: Family Medicine

## 2020-03-22 DIAGNOSIS — Z1231 Encounter for screening mammogram for malignant neoplasm of breast: Secondary | ICD-10-CM

## 2020-03-30 ENCOUNTER — Encounter: Payer: Self-pay | Admitting: Family Medicine

## 2020-03-30 NOTE — Telephone Encounter (Signed)
Patient called and scheduled for Monday

## 2020-04-02 ENCOUNTER — Ambulatory Visit (INDEPENDENT_AMBULATORY_CARE_PROVIDER_SITE_OTHER): Payer: Medicare Other | Admitting: Family Medicine

## 2020-04-02 ENCOUNTER — Other Ambulatory Visit: Payer: Self-pay

## 2020-04-02 VITALS — BP 116/78 | HR 60 | Temp 98.1°F | Resp 12 | Ht 61.0 in | Wt 143.0 lb

## 2020-04-02 DIAGNOSIS — E559 Vitamin D deficiency, unspecified: Secondary | ICD-10-CM

## 2020-04-02 DIAGNOSIS — E7800 Pure hypercholesterolemia, unspecified: Secondary | ICD-10-CM

## 2020-04-02 DIAGNOSIS — R197 Diarrhea, unspecified: Secondary | ICD-10-CM

## 2020-04-02 DIAGNOSIS — M791 Myalgia, unspecified site: Secondary | ICD-10-CM | POA: Diagnosis not present

## 2020-04-02 DIAGNOSIS — I10 Essential (primary) hypertension: Secondary | ICD-10-CM

## 2020-04-02 DIAGNOSIS — D649 Anemia, unspecified: Secondary | ICD-10-CM

## 2020-04-02 LAB — COMPREHENSIVE METABOLIC PANEL
ALT: 23 U/L (ref 0–35)
AST: 23 U/L (ref 0–37)
Albumin: 4 g/dL (ref 3.5–5.2)
Alkaline Phosphatase: 93 U/L (ref 39–117)
BUN: 10 mg/dL (ref 6–23)
CO2: 28 mEq/L (ref 19–32)
Calcium: 9.4 mg/dL (ref 8.4–10.5)
Chloride: 105 mEq/L (ref 96–112)
Creatinine, Ser: 0.71 mg/dL (ref 0.40–1.20)
GFR: 81.72 mL/min (ref >60.00)
Glucose, Bld: 103 mg/dL — ABNORMAL HIGH (ref 70–99)
Potassium: 4.3 mEq/L (ref 3.5–5.1)
Sodium: 139 mEq/L (ref 135–145)
Total Bilirubin: 0.5 mg/dL (ref 0.2–1.2)
Total Protein: 6.4 g/dL (ref 6.0–8.3)

## 2020-04-02 LAB — CBC WITH DIFFERENTIAL/PLATELET
Basophils Absolute: 0 10*3/uL (ref 0.0–0.1)
Basophils Relative: 0.4 % (ref 0.0–3.0)
Eosinophils Absolute: 0.1 10*3/uL (ref 0.0–0.7)
Eosinophils Relative: 0.7 % (ref 0.0–5.0)
HCT: 38 % (ref 36.0–46.0)
Hemoglobin: 12.8 g/dL (ref 12.0–15.0)
Lymphocytes Relative: 16 % (ref 12.0–46.0)
Lymphs Abs: 1.5 10*3/uL (ref 0.7–4.0)
MCHC: 33.7 g/dL (ref 30.0–36.0)
MCV: 86.4 fl (ref 78.0–100.0)
Monocytes Absolute: 0.7 10*3/uL (ref 0.1–1.0)
Monocytes Relative: 7.3 % (ref 3.0–12.0)
Neutro Abs: 7 10*3/uL (ref 1.4–7.7)
Neutrophils Relative %: 75.6 % (ref 43.0–77.0)
Platelets: 290 10*3/uL (ref 150.0–400.0)
RBC: 4.4 Mil/uL (ref 3.87–5.11)
RDW: 13.3 % (ref 11.5–15.5)
WBC: 9.3 10*3/uL (ref 4.0–10.5)

## 2020-04-02 LAB — TSH: TSH: 1.29 u[IU]/mL (ref 0.35–4.50)

## 2020-04-02 LAB — MAGNESIUM: Magnesium: 1.9 mg/dL (ref 1.5–2.5)

## 2020-04-02 LAB — SEDIMENTATION RATE: Sed Rate: 2 mm/hr (ref 0–30)

## 2020-04-02 LAB — VITAMIN D 25 HYDROXY (VIT D DEFICIENCY, FRACTURES): VITD: 36.98 ng/mL (ref 30.00–100.00)

## 2020-04-02 MED ORDER — HYOSCYAMINE SULFATE 0.125 MG SL SUBL: 0.1250 mg | SUBLINGUAL_TABLET | SUBLINGUAL | 1 refills | Status: DC | PRN

## 2020-04-02 NOTE — Assessment & Plan Note (Signed)
Repeat CBC with differential today ?

## 2020-04-02 NOTE — Patient Instructions (Signed)
BRAT diet (bananas, rice, applesauce, toast)     Food Choices to Help Relieve Diarrhea, Adult When you have diarrhea, the foods you eat and your eating habits are very important. Choosing the right foods and drinks can help:  Relieve diarrhea.  Replace lost fluids and nutrients.  Prevent dehydration. What general guidelines should I follow?  Relieving diarrhea  Choose foods with less than 2 g or .07 oz. of fiber per serving.  Limit fats to less than 8 tsp (38 g or 1.34 oz.) a day.  Avoid the following: ? Foods and beverages sweetened with high-fructose corn syrup, honey, or sugar alcohols such as xylitol, sorbitol, and mannitol. ? Foods that contain a lot of fat or sugar. ? Fried, greasy, or spicy foods. ? High-fiber grains, breads, and cereals. ? Raw fruits and vegetables.  Eat foods that are rich in probiotics. These foods include dairy products such as yogurt and fermented milk products. They help increase healthy bacteria in the stomach and intestines (gastrointestinal tract, or GI tract).  If you have lactose intolerance, avoid dairy products. These may make your diarrhea worse.  Take medicine to help stop diarrhea (antidiarrheal medicine) only as told by your health care provider. Replacing nutrients  Eat small meals or snacks every 3-4 hours.  Eat bland foods, such as white rice, toast, or baked potato, until your diarrhea starts to get better. Gradually reintroduce nutrient-rich foods as tolerated or as told by your health care provider. This includes: ? Well-cooked protein foods. ? Peeled, seeded, and soft-cooked fruits and vegetables. ? Low-fat dairy products.  Take vitamin and mineral supplements as told by your health care provider. Preventing dehydration  Start by sipping water or a special solution to prevent dehydration (oral rehydration solution, ORS). Urine that is clear or pale yellow means that you are getting enough fluid.  Try to drink at least  8-10 cups of fluid each day to help replace lost fluids.  You may add other liquids in addition to water, such as clear juice or decaffeinated sports drinks, as tolerated or as told by your health care provider.  Avoid drinks with caffeine, such as coffee, tea, or soft drinks.  Avoid alcohol. What foods are recommended?     The items listed may not be a complete list. Talk with your health care provider about what dietary choices are best for you. Grains White rice. White, [place], or pita breads (fresh or toasted), including plain rolls, buns, or bagels. White pasta. Saltine, soda, or graham crackers. Pretzels. Low-fiber cereal. Cooked cereals made with water (such as cornmeal, farina, or cream cereals). Plain muffins. Matzo. Melba toast. Zwieback. Vegetables Potatoes (without the skin). Most well-cooked and canned vegetables without skins or seeds. Tender lettuce. Fruits Apple sauce. Fruits canned in juice. Cooked apricots, cherries, grapefruit, peaches, pears, or plums. Fresh bananas and cantaloupe. Meats and other protein foods Baked or boiled chicken. Eggs. Tofu. Fish. Seafood. Smooth nut butters. Ground or well-cooked tender beef, ham, veal, lamb, pork, or poultry. Dairy Plain yogurt, kefir, and unsweetened liquid yogurt. Lactose-free milk, buttermilk, skim milk, or soy milk. Low-fat or nonfat hard cheese. Beverages Water. Low-calorie sports drinks. Fruit juices without pulp. Strained tomato and vegetable juices. Decaffeinated teas. Sugar-free beverages not sweetened with sugar alcohols. Oral rehydration solutions, if approved by your health care provider. Seasoning and other foods Bouillon, broth, or soups made from recommended foods. What foods are not recommended? The items listed may not be a complete list. Talk with your health care  provider about what dietary choices are best for you. Grains Whole grain, whole wheat, bran, or rye breads, rolls, pastas, and crackers. Wild or  brown rice. Whole grain or bran cereals. Barley. Oats and oatmeal. Corn tortillas or taco shells. Granola. Popcorn. Vegetables Raw vegetables. Fried vegetables. Cabbage, broccoli, Brussels sprouts, artichokes, baked beans, beet greens, corn, kale, legumes, peas, sweet potatoes, and yams. Potato skins. Cooked spinach and cabbage. Fruits Dried fruit, including raisins and dates. Raw fruits. Stewed or dried prunes. Canned fruits with syrup. Meat and other protein foods Fried or fatty meats. Deli meats. Chunky nut butters. Nuts and seeds. Beans and lentils. Berniece Salines. Hot dogs. Sausage. Dairy High-fat cheeses. Whole milk, chocolate milk, and beverages made with milk, such as milk shakes. Half-and-half. Cream. sour cream. Ice cream. Beverages Caffeinated beverages (such as coffee, tea, soda, or energy drinks). Alcoholic beverages. Fruit juices with pulp. Prune juice. Soft drinks sweetened with high-fructose corn syrup or sugar alcohols. High-calorie sports drinks. Fats and oils Butter. Cream sauces. Margarine. Salad oils. Plain salad dressings. Olives. Avocados. Mayonnaise. Sweets and desserts Sweet rolls, doughnuts, and sweet breads. Sugar-free desserts sweetened with sugar alcohols such as xylitol and sorbitol. Seasoning and other foods Honey. Hot sauce. Chili powder. Gravy. Cream-based or milk-based soups. Pancakes and waffles. Summary  When you have diarrhea, the foods you eat and your eating habits are very important.  Make sure you get at least 8-10 cups of fluid each day, or enough to keep your urine clear or pale yellow.  Eat bland foods and gradually reintroduce healthy, nutrient-rich foods as tolerated, or as told by your health care provider.  Avoid high-fiber, fried, greasy, or spicy foods. This information is not intended to replace advice given to you by your health care provider. Make sure you discuss any questions you have with your health care provider. Document Revised: 12/23/2018  Document Reviewed: 08/29/2016 Elsevier Patient Education  [place]. Diarrhea, Adult Diarrhea is frequent loose and watery bowel movements. Diarrhea can make you feel weak and cause you to become dehydrated. Dehydration can make you tired and thirsty, cause you to have a dry mouth, and decrease how often you urinate. Diarrhea typically lasts 2-3 days. However, it can last longer if it is a sign of something more serious. It is important to treat your diarrhea as told by your health care provider. Follow these instructions at home: Eating and drinking     Follow these recommendations as told by your health care provider:  Take an oral rehydration solution (ORS). This is an over-the-counter medicine that helps return your body to its normal balance of nutrients and water. It is found at pharmacies and retail stores.  Drink plenty of fluids, such as water, ice chips, diluted fruit juice, and low-calorie sports drinks. You can drink milk also, if desired.  Avoid drinking fluids that contain a lot of sugar or caffeine, such as energy drinks, sports drinks, and soda.  Eat bland, easy-to-digest foods in small amounts as you are able. These foods include bananas, applesauce, rice, lean meats, toast, and crackers.  Avoid alcohol.  Avoid spicy or fatty foods.  Medicines  Take over-the-counter and prescription medicines only as told by your health care provider.  If you were prescribed an antibiotic medicine, take it as told by your health care provider. Do not stop using the antibiotic even if you start to feel better. General instructions   Wash your hands often using soap and water. If soap and water are not available,  use a hand sanitizer. Others in the household should wash their hands as well. Hands should be washed: ? After using the toilet or changing a diaper. ? Before preparing, cooking, or serving food. ? While caring for a sick person or while visiting someone in a  hospital.  Drink enough fluid to keep your urine pale yellow.  Rest at home while you recover.  Watch your condition for any changes.  Take a warm bath to relieve any burning or pain from frequent diarrhea episodes.  Keep all follow-up visits as told by your health care provider. This is important. Contact a health care provider if:  You have a fever.  Your diarrhea gets worse.  You have new symptoms.  You cannot keep fluids down.  You feel light-headed or dizzy.  You have a headache.  You have muscle cramps. Get help right away if:  You have chest pain.  You feel extremely weak or you faint.  You have bloody or black stools or stools that look like tar.  You have severe pain, cramping, or bloating in your abdomen.  You have trouble breathing or you are breathing very quickly.  Your heart is beating very quickly.  Your skin feels cold and clammy.  You feel confused.  You have signs of dehydration, such as: ? Dark urine, very little urine, or no urine. ? Cracked lips. ? Dry mouth. ? Sunken eyes. ? Sleepiness. ? Weakness. Summary  Diarrhea is frequent loose and watery bowel movements. Diarrhea can make you feel weak and cause you to become dehydrated.  Drink enough fluids to keep your urine pale yellow.  Make sure that you wash your hands after using the toilet. If soap and water are not available, use hand sanitizer.  Contact a health care provider if your diarrhea gets worse or you have new symptoms.  Get help right away if you have signs of dehydration. This information is not intended to replace advice given to you by your health care provider. Make sure you discuss any questions you have with your health care provider. Document Revised: 01/18/2019 Document Reviewed: 02/05/2018 Elsevier Patient Education  [place].

## 2020-04-02 NOTE — Assessment & Plan Note (Signed)
Supplement and monitor 

## 2020-04-02 NOTE — Assessment & Plan Note (Signed)
Calf muscles especially, encouraged to consider different foot wear and proceed with checking labs including magnesium. Hydrate well

## 2020-04-02 NOTE — Addendum Note (Signed)
Addended by: Caffie Pinto on: 04/02/2020 03:23 PM   Modules accepted: Orders

## 2020-04-02 NOTE — Assessment & Plan Note (Signed)
Well controlled, no changes to meds. Encouraged heart healthy diet such as the DASH diet and exercise as tolerated.  °

## 2020-04-02 NOTE — Progress Notes (Signed)
Patient ID: Brenda Lara, female   DOB: 02-Jun-1951, 69 y.o.   MRN: [id number]    Subjective:    Patient ID: Brenda Lara, female    DOB: [birth year], 69 y.o.   MRN: [id number]  Chief Complaint  Patient presents with  . Diarrhea    HPI Patient is in today for evaluation of numerous symptoms including diarrhea. Presently she is improving. Given Hyosycamine to try for cramping. Was having upwards of 20 loose stool when this started on July 4 but no one around her has been sick. Now less than 10 a day and only one episode of vomiting since this started. Denies CP/palp/SOB/HA/congestion/fevers or GU c/o. Taking meds as prescribed  Past Medical History:  Diagnosis Date  . Allergy   . Anemia   . Angina pectoris   . Anxiety   . Cataract   . Cervical cancer screening 01/31/2016   Menarche at 12 Regular and moderate flow No history of abnormal pap in past G1P1, s/p 1 SVD No history of abnormal MGM No concerns today gyn surgeries b/l tubal ligation and b/y oophorectomy  . Colon cancer ([place]) 2010  . Coronary artery disease 2001/2005   S/P stenting to RCA and LAD  . GERD (gastroesophageal reflux disease)   . Hearing loss 08/05/2015  . History of chicken pox   . HTN (hypertension)   . Menopause   . Preventative health care 01/31/2016  . Pure hypercholesterolemia   . Vitamin D deficiency 06/01/2017    Past Surgical History:  Procedure Laterality Date  . APPENDECTOMY  02/21/2009  . [place]   multiple  . BILATERAL OOPHORECTOMY  2010  . CHOLECYSTECTOMY  11/09  . COLON SURGERY  2010   right colectomy  . COLONOSCOPY  01/26/2014   NORMAL   . CORONARY STENT PLACEMENT  2001/2005   RCA  . CYSTECTOMY     left ovary    Family History  Problem Relation Age of Onset  . Hypertension Mother   . Transient ischemic attack Mother   . Heart attack Mother        age 51  . Heart disease Mother   . Leukemia Mother   . Heart attack Father        16's  . Heart disease Father    . Alcohol abuse Father   . Arthritis Father        mva  . Prostate cancer Father   . Thyroid disease Daughter   . Coronary artery disease Other   . Colon cancer Neg Hx   . Pancreatic cancer Neg Hx   . Rectal cancer Neg Hx   . Stomach cancer Neg Hx   . Esophageal cancer Neg Hx     Social History   Socioeconomic History  . Marital status: Married    Spouse name: Not on file  . Number of children: Not on file  . Years of education: Not on file  . Highest education level: Not on file  Occupational History  . Not on file  Tobacco Use  . Smoking status: Never Smoker  . Smokeless tobacco: Never Used  Vaping Use  . Vaping Use: Never used  Substance and Sexual Activity  . Alcohol use: No  . Drug use: No  . 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 Not on file  Social History Narrative  .  Not on file   Social Determinants of Health   Financial Resource Strain: Low Risk   . Difficulty of Paying Living Expenses: Not hard at all  Food Insecurity: No Food Insecurity  . Worried About Charity fundraiser in the Last Year: Never true  . Ran Out of Food in the Last Year: Never true  Transportation Needs: No Transportation Needs  . Lack of Transportation (Medical): No  . Lack of Transportation (Non-Medical): No  Physical Activity:   . Days of Exercise per Week:   . Minutes of Exercise per Session:   Stress:   . Feeling of Stress :   Social Connections:   . Frequency of Communication with Friends and Family:   . Frequency of Social Gatherings with Friends and Family:   . Attends Religious Services:   . Active Member of Clubs or Organizations:   . Attends Archivist Meetings:   Marland Kitchen Marital Status:   Intimate Partner Violence:   . Fear of Current or Ex-Partner:   . Emotionally Abused:   Marland Kitchen Physically Abused:   . Sexually Abused:      Outpatient Medications Prior to Visit  Medication Sig Dispense Refill  . aspirin 325 MG tablet Take 1 tablet (325 mg total) by mouth daily.    Marland Kitchen atenolol (TENORMIN) 50 MG tablet TAKE 1 TABLET BY MOUTH TWICE A DAY 180 tablet 3  . atorvastatin (LIPITOR) 40 MG tablet Take 1 tablet (40 mg total) by mouth daily. 90 tablet 3  . famotidine (PEPCID) 20 MG tablet TAKE 0.5-2 TABLETS (10-40 MG TOTAL) BY MOUTH AT BEDTIME AS NEEDED FOR HEARTBURN OR INDIGESTION. 180 tablet 1  . irbesartan (AVAPRO) 75 MG tablet TAKE 1 TABLET BY MOUTH EVERY DAY 90 tablet 1  . meclizine (ANTIVERT) 25 MG tablet Take 1 tablet (25 mg total) by mouth 3 (three) times daily as needed for dizziness. 90 tablet 0  . Multiple Vitamin (MULTIVITAMIN) tablet Take 1 tablet by mouth daily.    Marland Kitchen omeprazole (PRILOSEC) 40 MG capsule Take 1 capsule (40 mg total) by mouth daily. 90 capsule 3  . topiramate (TOPAMAX) 100 MG tablet Take 100 mg by mouth daily.      No facility-administered medications prior to visit.    No Known Allergies  Review of Systems  Constitutional: Positive for malaise/fatigue. Negative for chills and fever.  HENT: Negative for congestion.   Eyes: Negative for blurred vision.  Respiratory: Negative for shortness of breath.   Cardiovascular: Negative for chest pain, palpitations and leg swelling.  Gastrointestinal: Positive for abdominal pain, heartburn, nausea and vomiting. Negative for blood in stool.  Genitourinary: Negative for dysuria and frequency.  Musculoskeletal: Positive for myalgias. Negative for falls.  Skin: Negative for rash.  Neurological: Negative for dizziness, loss of consciousness and headaches.  Endo/Heme/Allergies: Negative for environmental allergies.  Psychiatric/Behavioral: Negative for depression. The patient is not nervous/anxious.        Objective:    Physical Exam Vitals and nursing note reviewed.  Constitutional:      General: She is not in acute distress.    Appearance: She  is well-developed.  HENT:     Head: Normocephalic and atraumatic.     Nose: Nose normal.  Eyes:     General:        Right eye: No discharge.        Left eye: No discharge.  Cardiovascular:     Rate and Rhythm: Normal rate and regular rhythm.  Heart sounds: No murmur heard.   Pulmonary:     Effort: Pulmonary effort is normal.     Breath sounds: Normal breath sounds.  Abdominal:     General: Bowel sounds are normal.     Palpations: Abdomen is soft.     Tenderness: There is no abdominal tenderness.  Musculoskeletal:     Cervical back: Normal range of motion and neck supple.  Skin:    General: Skin is warm and dry.  Neurological:     Mental Status: She is alert and oriented to person, place, and time.     BP 116/78 (BP Location: Right Arm, Cuff Size: Normal)   Pulse 60   Temp 98.1 F (36.7 C) (Oral)   Resp 12   Ht 5\' 1"  (1.549 m)   Wt 143 lb (64.9 kg)   SpO2 98%   BMI 27.02 kg/m  Wt Readings from Last 3 Encounters:  04/02/20 143 lb (64.9 kg)  07/07/19 142 lb 9.6 oz (64.7 kg)  06/13/19 143 lb (64.9 kg)    Diabetic Foot Exam - Simple   No data filed     Lab Results  Component Value Date   WBC 6.3 06/17/2019   HGB 12.7 06/17/2019   HCT 38.7 06/17/2019   PLT 263.0 06/17/2019   GLUCOSE 85 06/17/2019   CHOL 131 06/17/2019   TRIG 120.0 06/17/2019   HDL 56.30 06/17/2019   LDLCALC 51 06/17/2019   ALT 18 06/17/2019   AST 19 06/17/2019   NA 138 06/17/2019   K 4.0 06/17/2019   CL 106 06/17/2019   CREATININE 0.79 06/17/2019   BUN 24 (H) 06/17/2019   CO2 24 06/17/2019   TSH 3.76 06/17/2019   INR 0.95 05/13/2011    Lab Results  Component Value Date   TSH 3.76 06/17/2019   Lab Results  Component Value Date   WBC 6.3 06/17/2019   HGB 12.7 06/17/2019   HCT 38.7 06/17/2019   MCV 88.6 06/17/2019   PLT 263.0 06/17/2019   Lab Results  Component Value Date   NA 138 06/17/2019   K 4.0 06/17/2019   CO2 24 06/17/2019   GLUCOSE 85 06/17/2019   BUN 24 (H)  06/17/2019   CREATININE 0.79 06/17/2019   BILITOT 0.7 06/17/2019   ALKPHOS 94 06/17/2019   AST 19 06/17/2019   ALT 18 06/17/2019   PROT 6.4 06/17/2019   ALBUMIN 4.1 06/17/2019   CALCIUM 9.1 06/17/2019   GFR 72.41 06/17/2019   Lab Results  Component Value Date   CHOL 131 06/17/2019   Lab Results  Component Value Date   HDL 56.30 06/17/2019   Lab Results  Component Value Date   LDLCALC 51 06/17/2019   Lab Results  Component Value Date   TRIG 120.0 06/17/2019   Lab Results  Component Value Date   CHOLHDL 2 06/17/2019   No results found for: HGBA1C     Assessment & Plan:   Problem List Items Addressed This Visit    HYPERCHOLESTEROLEMIA   Essential hypertension - Primary    Well controlled, no changes to meds. Encouraged heart healthy diet such as the DASH diet and exercise as tolerated.       Relevant Orders   Comprehensive metabolic panel   TSH   Anemia    Repeat CBC with differential today      Vitamin D deficiency    Supplement and monitor      Relevant Orders   VITAMIN D 25 Hydroxy (Vit-D Deficiency, Fractures)  Myalgia    Calf muscles especially, encouraged to consider different foot wear and proceed with checking labs including magnesium. Hydrate well      Relevant Orders   Magnesium   Diarrhea    Clear fluids, BRAT diet, check labs, present for care if fevers, blood worsening symptoms. Presently she is improving. Given Hyosycamine to try for cramping. Was having upwards of 20 loose stool when this started on July 4 but no one around her has been sick. Now less than 10 a day and only one episode of vomiting since this started. Can try Ginger capsules prn. Hydrate with electrolyte beverages.       Relevant Orders   CBC with Differential/Platelet   Magnesium   Sedimentation rate   Stool, WBC/Lactoferrin   Stool Culture   Ova and parasite examination      I am having Lashaundra A. Bitter start on hyoscyamine. I am also having her maintain her  topiramate, aspirin, meclizine, multivitamin, omeprazole, atorvastatin, famotidine, atenolol, and irbesartan.  Meds ordered this encounter  Medications  . hyoscyamine (LEVSIN SL) 0.125 MG SL tablet    Sig: Place 1 tablet (0.125 mg total) under the tongue every 4 (four) hours as needed.    Dispense:  30 tablet    Refill:  1     Penni Homans, MD

## 2020-04-02 NOTE — Assessment & Plan Note (Signed)
Clear fluids, BRAT diet, check labs, present for care if fevers, blood worsening symptoms. Presently she is improving. Given Hyosycamine to try for cramping. Was having upwards of 20 loose stool when this started on July 4 but no one around her has been sick. Now less than 10 a day and only one episode of vomiting since this started. Can try Ginger capsules prn. Hydrate with electrolyte beverages.

## 2020-04-04 LAB — FECAL LACTOFERRIN, QUANT
Fecal Lactoferrin: POSITIVE — AB
MICRO NUMBER:: 10721260
SPECIMEN QUALITY:: ADEQUATE

## 2020-04-10 LAB — OVA AND PARASITE EXAMINATION
CONCENTRATE RESULT:: NONE SEEN
MICRO NUMBER:: 10721021
SPECIMEN QUALITY:: ADEQUATE
TRICHROME RESULT:: NONE SEEN

## 2020-04-10 LAB — STOOL CULTURE
MICRO NUMBER:: 10721019
MICRO NUMBER:: 10721020
MICRO NUMBER:: 10721022
SHIGA RESULT:: NOT DETECTED
SPECIMEN QUALITY:: ADEQUATE
SPECIMEN QUALITY:: ADEQUATE
SPECIMEN QUALITY:: ADEQUATE

## 2020-04-11 DIAGNOSIS — H52223 Regular astigmatism, bilateral: Secondary | ICD-10-CM | POA: Diagnosis not present

## 2020-04-23 ENCOUNTER — Other Ambulatory Visit: Payer: Self-pay

## 2020-04-23 ENCOUNTER — Ambulatory Visit (HOSPITAL_BASED_OUTPATIENT_CLINIC_OR_DEPARTMENT_OTHER)
Admission: RE | Admit: 2020-04-23 | Discharge: 2020-04-23 | Disposition: A | Discharge status: Home / Self Care | Payer: Medicare Other | Source: Ambulatory Visit | Attending: Family Medicine | Admitting: Family Medicine

## 2020-04-23 ENCOUNTER — Encounter (HOSPITAL_BASED_OUTPATIENT_CLINIC_OR_DEPARTMENT_OTHER): Payer: Self-pay

## 2020-04-23 DIAGNOSIS — Z1231 Encounter for screening mammogram for malignant neoplasm of breast: Secondary | ICD-10-CM

## 2020-04-25 ENCOUNTER — Other Ambulatory Visit: Payer: Self-pay | Admitting: Internal Medicine

## 2020-04-30 DIAGNOSIS — G43719 Chronic migraine without aura, intractable, without status migrainosus: Secondary | ICD-10-CM | POA: Diagnosis not present

## 2020-04-30 DIAGNOSIS — G43019 Migraine without aura, intractable, without status migrainosus: Secondary | ICD-10-CM | POA: Diagnosis not present

## 2020-04-30 DIAGNOSIS — R519 Headache, unspecified: Secondary | ICD-10-CM | POA: Diagnosis not present

## 2020-06-04 DIAGNOSIS — U071 COVID-19: Secondary | ICD-10-CM | POA: Diagnosis not present

## 2020-06-04 LAB — NOVEL CORONAVIRUS, NAA: SARS-CoV-2, NAA: DETECTED

## 2020-06-05 ENCOUNTER — Encounter: Payer: Self-pay | Admitting: Internal Medicine

## 2020-06-05 ENCOUNTER — Other Ambulatory Visit: Payer: Self-pay

## 2020-06-05 ENCOUNTER — Telehealth (INDEPENDENT_AMBULATORY_CARE_PROVIDER_SITE_OTHER): Payer: Medicare Other | Admitting: Internal Medicine

## 2020-06-05 VITALS — BP 155/71 | HR 65 | Temp 98.4°F | Ht 61.0 in | Wt 138.0 lb

## 2020-06-05 DIAGNOSIS — U071 COVID-19: Secondary | ICD-10-CM

## 2020-06-05 NOTE — Progress Notes (Signed)
LMOM for infusion clinic.

## 2020-06-05 NOTE — Progress Notes (Signed)
Pre visit review using our clinic review tool, if applicable. No additional management support is needed unless otherwise documented below in the visit note. 

## 2020-06-05 NOTE — Progress Notes (Signed)
Subjective:    Patient ID: Brenda Lara, female    DOB: 02/22/1951, 69 y.o.   MRN: [id number]  DOS:  06/05/2020 Type of visit - description: Virtual Visit via Telephone    I connected with above mentioned patient  by telephone and verified that I am speaking with the correct person using two identifiers.  THIS ENCOUNTER IS A VIRTUAL VISIT DUE TO COVID-19 - PATIENT WAS NOT SEEN IN THE OFFICE. PATIENT HAS CONSENTED TO VIRTUAL VISIT / TELEMEDICINE VISIT   Location of patient: home  Location of provider: office  Persons participating in the virtual visit: patient, provider   I discussed the limitations, risks, security and privacy concerns of performing an evaluation and management service by telephone and the availability of in person appointments. I also discussed with the patient that there may be a patient responsible charge related to this service. The patient expressed understanding and agreed to proceed.  Acute Approximately 3 days ago she developed symptoms: Cough, runny nose, headache, sore throat, low-grade fever and chills. Went to urgent care yesterday and tested positive for Covid.  Denies chest pain no difficulty breathing. No rash. She is fully vaccinated.    Review of Systems See above   Past Medical History:  Diagnosis Date  . Allergy   . Anemia   . Angina pectoris   . Anxiety   . Cataract   . Cervical cancer screening 01/31/2016   Menarche at 12 Regular and moderate flow No history of abnormal pap in past G1P1, s/p 1 SVD No history of abnormal MGM No concerns today gyn surgeries b/l tubal ligation and b/y oophorectomy  . Colon cancer ([place]) 2010  . Coronary artery disease 2001/2005   S/P stenting to RCA and LAD  . GERD (gastroesophageal reflux disease)   . Hearing loss 08/05/2015  . History of chicken pox   . HTN (hypertension)   . Menopause   . Preventative health care 01/31/2016  . Pure hypercholesterolemia   . Vitamin D deficiency 06/01/2017     Past Surgical History:  Procedure Laterality Date  . APPENDECTOMY  02/21/2009  . [place]   multiple  . BILATERAL OOPHORECTOMY  2010  . CHOLECYSTECTOMY  11/09  . COLON SURGERY  2010   right colectomy  . COLONOSCOPY  01/26/2014   NORMAL   . CORONARY STENT PLACEMENT  2001/2005   RCA  . CYSTECTOMY     left ovary    Allergies as of 06/05/2020   No Known Allergies     Medication List       Accurate as of June 05, 2020  4:55 PM. If you have any questions, ask your nurse or doctor.        aspirin 325 MG tablet Take 1 tablet (325 mg total) by mouth daily.   atenolol 50 MG tablet Commonly known as: TENORMIN TAKE 1 TABLET BY MOUTH TWICE A DAY   atorvastatin 40 MG tablet Commonly known as: LIPITOR Take 1 tablet (40 mg total) by mouth daily.   famotidine 20 MG tablet Commonly known as: PEPCID TAKE 0.5-2 TABLETS (10-40 MG TOTAL) BY MOUTH AT BEDTIME AS NEEDED FOR HEARTBURN OR INDIGESTION.   hyoscyamine 0.125 MG SL tablet Commonly known as: LEVSIN SL Place 1 tablet (0.125 mg total) under the tongue every 4 (four) hours as needed.   irbesartan 75 MG tablet Commonly known as: AVAPRO TAKE 1 TABLET BY MOUTH EVERY DAY   meclizine 25 MG tablet Commonly known as:  ANTIVERT Take 1 tablet (25 mg total) by mouth 3 (three) times daily as needed for dizziness.   multivitamin tablet Take 1 tablet by mouth daily.   omeprazole 40 MG capsule Commonly known as: PRILOSEC TAKE 1 CAPSULE BY MOUTH EVERY DAY   topiramate 100 MG tablet Commonly known as: TOPAMAX Take 100 mg by mouth daily.          Objective:   Physical Exam BP (!) 155/71   Pulse 65   Temp 98.4 F (36.9 C) (Oral)   Ht 5\' 1"  (1.549 m)   Wt 138 lb (62.6 kg)   SpO2 97%   BMI 26.07 kg/m  This is a telephone visit, she sounded well, alert oriented x3, speaking in complete sentences, no cough.    Assessment    69 year old female, PMHIncludes HTN, CAD, headaches,History of colon cancer,  GERD, presents with:  The patient is 48, she is fully vaccinated, developed symptoms 3 days ago, tested positive yesterday. I discussed with the patient the self management of Covid, instructions sent via MyChart.  See message. We contacted the infusion center, to see if she is candidate for monoclonal antibody. She knows she will remain contagious for the next 10 days, needs to quarantine his husband who is also fully vaccinated but currently asymptomatic.   I discussed the assessment and treatment plan with the patient. The patient was provided an opportunity to ask questions and all were answered. The patient agreed with the plan and demonstrated an understanding of the instructions.   The patient was advised to call back or seek an in-person evaluation if the symptoms worsen or if the condition fails to improve as anticipated.  I provided 22  minutes of non-face-to-face time during this encounter.  Kathlene November, MD

## 2020-06-06 ENCOUNTER — Encounter: Payer: Self-pay | Admitting: Hospice and Palliative Medicine

## 2020-06-06 ENCOUNTER — Telehealth: Payer: Self-pay | Admitting: Hospice and Palliative Medicine

## 2020-06-06 NOTE — Telephone Encounter (Signed)
I called to discuss with patient about Covid-19 symptoms and the use of regeneron, a monoclonal antibody infusion for those with mild to moderate Covid-19 symptoms and at a high risk of hospitalization.     Pt is qualified for this infusion at the [facility] due to co-morbid conditions and/or a member of an at-risk group.     Unable to reach pt. Left message to return call  Altha Harm, PhD, NP-C [phone] ([place])

## 2020-06-12 NOTE — Progress Notes (Signed)
Patient ID: Brenda Lara, female   DOB: 1950-09-28, 70 y.o.   MRN: [id number]     69 y.o. with distant history of CAD. Stenting of RCA in 2006 and LAD in 2005. Last cath in 2010 stents widely patent Last ETT in 2016 was normal. Statin changed to Lipitor in February 2019 with target LDL reached. Colon cancer in remission post colectomy EF is 55% with some apical hypokinesis but has not had echo in long time.   No angina Compliant with meds  Anginal equivalent is inner arm pain  Some throbbing and pain in legs not claudication has excellent pulses Likely from some varicosities Venous duplex LLE negative for DVT March 2019  Husband retired and home more Seems to cramp her life style. Community center open now but she has not gone back   Tested positive for COVID 06/04/20 Has been vaccinated She had [place] who Tested negative 3 times   ROS: Denies fever, malais, weight loss, blurry vision, decreased visual acuity, cough, sputum, SOB, hemoptysis, pleuritic pain, palpitaitons, heartburn, abdominal pain, melena, lower extremity edema, claudication, or rash.  All other systems reviewed and negative  General: BP 130/72    Pulse (!) 58    Ht 5\' 1"  (1.549 m)    Wt 137 lb 6.4 oz (62.3 kg)    BMI 25.96 kg/m  Affect appropriate Healthy:  appears stated age 28: normal Neck supple with no adenopathy JVP normal no bruits no thyromegaly Lungs clear with no wheezing and good diaphragmatic motion Heart:  S1/S2 no murmur, no rub, gallop or click PMI normal Abdomen: benighn, BS positve, no tenderness, no AAA post colectomy  no bruit.  No HSM or HJR Distal pulses intact with no bruits No edema Neuro non-focal Skin warm and dry No muscular weakness   Current Outpatient Medications  Medication Sig Dispense Refill   atenolol (TENORMIN) 50 MG tablet TAKE 1 TABLET BY MOUTH TWICE A DAY 180 tablet 3   atorvastatin (LIPITOR) 40 MG tablet Take 1 tablet (40 mg total)  by mouth daily. 90 tablet 3   famotidine (PEPCID) 20 MG tablet TAKE 0.5-2 TABLETS (10-40 MG TOTAL) BY MOUTH AT BEDTIME AS NEEDED FOR HEARTBURN OR INDIGESTION. 180 tablet 1   hyoscyamine (LEVSIN SL) 0.125 MG SL tablet Place 1 tablet (0.125 mg total) under the tongue every 4 (four) hours as needed. 30 tablet 1   irbesartan (AVAPRO) 75 MG tablet TAKE 1 TABLET BY MOUTH EVERY DAY 90 tablet 1   meclizine (ANTIVERT) 25 MG tablet Take 1 tablet (25 mg total) by mouth 3 (three) times daily as needed for dizziness. 90 tablet 0   Multiple Vitamin (MULTIVITAMIN) tablet Take 1 tablet by mouth daily.     omeprazole (PRILOSEC) 40 MG capsule TAKE 1 CAPSULE BY MOUTH EVERY DAY 90 capsule 1   topiramate (TOPAMAX) 100 MG tablet Take 100 mg by mouth daily.      aspirin EC 81 MG tablet Take 1 tablet (81 mg total) by mouth daily. Swallow whole. 30 tablet 11   No current facility-administered medications for this visit.    Allergies  Patient has no known allergies.  Electrocardiogram:  06/21/20 SR rate 58 normal   Assessment and Plan  CAD:  Distant history of RCA/LAD stenting  Patent by cath in 2010.  No arm/chest pain normal ECG continue ASA and beta blocker Normal ETT  June 2016 will update ETT make sure she does well with it Change ASA to  81 mg  Chol:  Changed to Lipitor 10/2017 with target LDL reached  Cholesterol is at goal.  Continue current dose of statin and diet Rx.  No myalgias or side effects.  F/U  LFT's in 6 months. Lab Results  Component Value Date   LDLCALC 51 06/17/2019    Colon Cancer:  Colonoscopy normal last 2015  in remission fu GI  GERD:  Continue zantac as needed  Vertigo:  Infrequent has antivert as needed   COVID:  Fully vaccinated tested positive 06/04/20 feels fine just had bad cough Had Moderna vaccine initially    [employer]

## 2020-06-15 DIAGNOSIS — U071 COVID-19: Secondary | ICD-10-CM | POA: Diagnosis not present

## 2020-06-21 ENCOUNTER — Other Ambulatory Visit: Payer: Self-pay

## 2020-06-21 ENCOUNTER — Encounter: Payer: Self-pay | Admitting: Cardiovascular Disease

## 2020-06-21 ENCOUNTER — Ambulatory Visit: Payer: Medicare Other | Admitting: Cardiovascular Disease

## 2020-06-21 VITALS — BP 130/72 | HR 58 | Ht 61.0 in | Wt 137.4 lb

## 2020-06-21 DIAGNOSIS — I2510 Atherosclerotic heart disease of native coronary artery without angina pectoris: Secondary | ICD-10-CM

## 2020-06-21 MED ORDER — ASPIRIN EC 81 MG PO TBEC
81.0000 mg | DELAYED_RELEASE_TABLET | Freq: Every day | ORAL | 11 refills | Status: DC
Start: 2020-06-21 — End: 2021-06-10

## 2020-06-21 NOTE — Patient Instructions (Addendum)
Medication Instructions:  Your physician has recommended you make the following change in your medication:  1-Decrease Aspirin 81 mg by mouth daily. *If you need a refill on your cardiac medications before your next appointment, please call your pharmacy*  Lab Work: If you have labs (blood work) drawn today and your tests are completely normal, you will receive your results only by: Marland Kitchen MyChart Message (if you have MyChart) OR . A paper copy in the mail If you have any lab test that is abnormal or we need to change your treatment, we will call you to review the results.  Testing/Procedures: Your physician has requested that you have an exercise tolerance test. For further information please visit [email or web address]. Please also follow instruction sheet, as given.  Follow-Up: At [facility], you and your health needs are our priority.  As part of our continuing mission to provide you with exceptional heart care, we have created designated Provider Care Teams.  These Care Teams include your primary Cardiologist (physician) and Advanced Practice Providers (APPs -  Physician Assistants and Nurse Practitioners) who all work together to provide you with the care you need, when you need it.  We recommend signing up for the patient portal called "MyChart".  Sign up information is provided on this After Visit Summary.  MyChart is used to connect with patients for Virtual Visits (Telemedicine).  Patients are able to view lab/test results, encounter notes, upcoming appointments, etc.  Non-urgent messages can be sent to your provider as well.   To learn more about what you can do with MyChart, go to [email or web address].    Your next appointment:   12 month(s)  The format for your next appointment:   In Person  Provider:   You may see Dr. Johnsie Cancel or one of the following Advanced Practice Providers on your designated Care Team:    Truitt Merle, NP  Cecilie Kicks, NP  Kathyrn Drown, NP

## 2020-07-06 ENCOUNTER — Inpatient Hospital Stay (HOSPITAL_COMMUNITY)
Admission: RE | Admit: 2020-07-06 | Discharge: 2020-07-06 | Disposition: A | Discharge status: Home / Self Care | Payer: Medicare Other | Source: Ambulatory Visit

## 2020-07-06 NOTE — Progress Notes (Signed)
Pt not tested for covid today due to pt testing + for covid on 06/05/20 (results in Epic). Based on the guidelines the pt is in the 90 day window to not retest. The pt is still expected to quarantine until their procedure. Therefore, the pt can still have the scheduled procedure. The pt's procedure is scheduled on Tues 07/10/20 with Dr. Johnsie Cancel.  These are the guidelines as follows:  Guidance: Patient previously tested + COVID; now past 90 day window seeking elective surgery (asymptomatic)  Retest patient If negative, proceed with surgery If positive, postpone surgery for 10 days from positive test Patient to quarantine for the (10 days) Do not retest again prior to surgery (even if scheduled a couple of weeks out) Use standard precautions for surgery

## 2020-07-10 ENCOUNTER — Other Ambulatory Visit: Payer: Self-pay

## 2020-07-10 ENCOUNTER — Ambulatory Visit (INDEPENDENT_AMBULATORY_CARE_PROVIDER_SITE_OTHER): Payer: Medicare Other

## 2020-07-10 DIAGNOSIS — I2510 Atherosclerotic heart disease of native coronary artery without angina pectoris: Secondary | ICD-10-CM | POA: Diagnosis not present

## 2020-07-10 LAB — EXERCISE TOLERANCE TEST
Estimated workload: 6.3 METS
Exercise duration (min): 3 min
Exercise duration (sec): 44 s
MPHR: 152 {beats}/min
Peak HR: 153 {beats}/min
Percent HR: 100 %
RPE: 17
Rest HR: 93 {beats}/min

## 2020-08-10 ENCOUNTER — Other Ambulatory Visit: Payer: Self-pay | Admitting: Cardiovascular Disease

## 2020-10-16 ENCOUNTER — Other Ambulatory Visit: Payer: Self-pay | Admitting: Internal Medicine

## 2020-11-23 DIAGNOSIS — G43019 Migraine without aura, intractable, without status migrainosus: Secondary | ICD-10-CM | POA: Diagnosis not present

## 2020-11-23 DIAGNOSIS — G43719 Chronic migraine without aura, intractable, without status migrainosus: Secondary | ICD-10-CM | POA: Diagnosis not present

## 2020-12-06 ENCOUNTER — Other Ambulatory Visit: Payer: Self-pay | Admitting: Family Medicine

## 2020-12-28 ENCOUNTER — Other Ambulatory Visit: Payer: Self-pay | Admitting: Family Medicine

## 2021-03-21 ENCOUNTER — Other Ambulatory Visit (HOSPITAL_BASED_OUTPATIENT_CLINIC_OR_DEPARTMENT_OTHER): Payer: Self-pay | Admitting: Family Medicine

## 2021-03-21 DIAGNOSIS — Z1231 Encounter for screening mammogram for malignant neoplasm of breast: Secondary | ICD-10-CM

## 2021-03-24 ENCOUNTER — Other Ambulatory Visit: Payer: Self-pay | Admitting: Family Medicine

## 2021-04-01 ENCOUNTER — Encounter: Payer: Self-pay | Admitting: Family Medicine

## 2021-04-01 ENCOUNTER — Other Ambulatory Visit: Payer: Self-pay

## 2021-04-01 ENCOUNTER — Ambulatory Visit (HOSPITAL_BASED_OUTPATIENT_CLINIC_OR_DEPARTMENT_OTHER)
Admission: RE | Admit: 2021-04-01 | Discharge: 2021-04-01 | Disposition: A | Discharge status: Home / Self Care | Payer: Medicare Other | Source: Ambulatory Visit | Attending: Family Medicine | Admitting: Family Medicine

## 2021-04-01 ENCOUNTER — Ambulatory Visit (INDEPENDENT_AMBULATORY_CARE_PROVIDER_SITE_OTHER): Payer: Medicare Other | Admitting: Family Medicine

## 2021-04-01 VITALS — BP 130/68 | HR 76 | Temp 97.9°F | Resp 16 | Ht 61.0 in | Wt 141.4 lb

## 2021-04-01 DIAGNOSIS — R160 Hepatomegaly, not elsewhere classified: Secondary | ICD-10-CM | POA: Diagnosis not present

## 2021-04-01 DIAGNOSIS — R739 Hyperglycemia, unspecified: Secondary | ICD-10-CM

## 2021-04-01 DIAGNOSIS — Z0000 Encounter for general adult medical examination without abnormal findings: Secondary | ICD-10-CM

## 2021-04-01 DIAGNOSIS — L989 Disorder of the skin and subcutaneous tissue, unspecified: Secondary | ICD-10-CM

## 2021-04-01 DIAGNOSIS — I10 Essential (primary) hypertension: Secondary | ICD-10-CM

## 2021-04-01 DIAGNOSIS — E7800 Pure hypercholesterolemia, unspecified: Secondary | ICD-10-CM

## 2021-04-01 DIAGNOSIS — L578 Other skin changes due to chronic exposure to nonionizing radiation: Secondary | ICD-10-CM | POA: Diagnosis not present

## 2021-04-01 DIAGNOSIS — Z1159 Encounter for screening for other viral diseases: Secondary | ICD-10-CM | POA: Diagnosis not present

## 2021-04-01 DIAGNOSIS — E559 Vitamin D deficiency, unspecified: Secondary | ICD-10-CM | POA: Diagnosis not present

## 2021-04-01 DIAGNOSIS — M545 Low back pain, unspecified: Secondary | ICD-10-CM | POA: Diagnosis not present

## 2021-04-01 NOTE — Patient Instructions (Addendum)
Fatty Liver Disease  The liver converts food into energy, removes toxic material from the blood, makes important proteins, and absorbs necessary vitamins from food. Fatty liver disease occurs when too much fat has built up in your liver cells. Fatty liverdisease is also called hepatic steatosis. In many cases, fatty liver disease does not cause symptoms or problems. It is often diagnosed when tests are being done for other reasons. However, over time, fatty liver can cause inflammation that may lead to more serious liver problems, such as scarring of the liver (cirrhosis) and liver failure. Fatty liver is associated with insulin resistance, increased body fat, high blood pressure (hypertension), and high cholesterol. These are features of metabolic syndrome and increaseyour risk for stroke, diabetes, and heart disease. What are the causes? This condition may be caused by components of metabolic syndrome: Obesity. Insulin resistance. High cholesterol. Other causes: Alcohol abuse. Poor nutrition. Cushing syndrome. Pregnancy. Certain drugs. Poisons. Some viral infections. What increases the risk? You are more likely to develop this condition if you: Abuse alcohol. Are overweight. Have diabetes. Have hepatitis. Have a high triglyceride level. Are pregnant. What are the signs or symptoms? Fatty liver disease often does not cause symptoms. If symptoms do develop, they can include: Fatigue and weakness. Weight loss. Confusion. Nausea, vomiting, or abdominal pain. Yellowing of your skin and the white parts of your eyes (jaundice). Itchy skin. How is this diagnosed? This condition may be diagnosed by: A physical exam and your medical history. Blood tests. Imaging tests, such as an ultrasound, CT scan, or MRI. A liver biopsy. A small sample of liver tissue is removed using a needle. The sample is then looked at under a microscope. How is this treated? Fatty liver disease is often  caused by other health conditions. Treatment for fatty liver may involve medicines and lifestyle changes to manage conditions such as: Alcoholism. High cholesterol. Diabetes. Being overweight or obese. Follow these instructions at home:  Do not drink alcohol. If you have trouble quitting, ask your health care provider how to safely quit with the help of medicine or a supervised program. This is important to keep your condition from getting worse. Eat a healthy diet as told by your health care provider. Ask your health care provider about working with a dietitian to develop an eating plan. Exercise regularly. This can help you lose weight and control your cholesterol and diabetes. Talk to your health care provider about an exercise plan and which activities are best for you. Take over-the-counter and prescription medicines only as told by your health care provider. Keep all follow-up visits. This is important. Contact a health care provider if: You have trouble controlling your: Blood sugar. This is especially important if you have diabetes. Cholesterol. Drinking of alcohol. Get help right away if: You have abdominal pain. You have jaundice. You have nausea and are vomiting. You vomit blood or material that looks like coffee grounds. You have stools that are black, tar-like, or bloody. Summary Fatty liver disease develops when too much fat builds up in the cells of your liver. Fatty liver disease often causes no symptoms or problems. However, over time, fatty liver can cause inflammation that may lead to more serious liver problems, such as scarring of the liver (cirrhosis). You are more likely to develop this condition if you abuse alcohol, are pregnant, are overweight, have diabetes, have hepatitis, or have high triglyceride or cholesterol levels. Contact your health care provider if you have trouble controlling your blood sugar, cholesterol,  or drinking of alcohol. This information is  not intended to replace advice given to you by your health care provider. Make sure you discuss any questions you have with your healthcare provider. Document Revised: 06/14/2020 Document Reviewed: 06/14/2020 Elsevier Patient Education  2022 [employer].     Shingrix is the new shingles shot, 2 shots over 2-6 months, confirm coverage with insurance and document, then can return here for shots with nurse appt or at pharmacy   Recommend calcium intake of 1200 to 1500 mg daily, divided into roughly 3 doses. Best source is the diet and a single dairy serving is about 500 mg, a supplement of calcium citrate once or twice daily to balance diet is fine if not getting enough in diet. Also need Vitamin D 2000 IU caps, 1 cap daily if not already taking vitamin D. Also recommend weight baring exercise on hips and upper body to keep bones strong   Molnupiravir or Paxlovid is the new COVID medication we can give you if you get COVID so make sure you test if you have symptoms because we have to treat by day 5 of symptoms for it to be effective. If you are positive let us know so we can treat. If a home test is negative and your symptoms are persistent get a PCR test. Can check testing locations at [email or web address] If you are positive we will make an appointment with [place] and we will send in Paxlovid if you would like it. Check with your pharmacy before we meet to confirm they have it in stock, if they do not then we can get the prescription at the [place] 70 Years and Older, Female Preventive care refers to lifestyle choices and visits with your health care provider that can promote health and wellness. This includes: A yearly physical exam. This is also called an annual wellness visit. Regular dental and eye exams. Immunizations. Screening for certain conditions. Healthy lifestyle choices, such as: Eating a healthy diet. Getting regular exercise. Not using drugs or  products that contain nicotine and tobacco. Limiting alcohol use. What can I expect for my preventive care visit? Physical exam Your health care provider will check your: Height and weight. These may be used to calculate your BMI (body mass index). BMI is a measurement that tells if you are at a healthy weight. Heart rate and blood pressure. Body temperature. Skin for abnormal spots. Counseling Your health care provider may ask you questions about your: Past medical problems. Family's medical history. Alcohol, tobacco, and drug use. Emotional well-being. Home life and relationship well-being. Sexual activity. Diet, exercise, and sleep habits. History of falls. Memory and ability to understand (cognition). Work and work Statistician. Pregnancy and menstrual history. Access to firearms. What immunizations do I need?  Vaccines are usually given at various ages, according to a schedule. Your health care provider will recommend vaccines for you based on your age, medicalhistory, and lifestyle or other factors, such as travel or where you work. What tests do I need? Blood tests Lipid and cholesterol levels. These may be checked every 5 years, or more often depending on your overall health. Hepatitis C test. Hepatitis B test. Screening Lung cancer screening. You may have this screening every year starting at age 16 if you have a 30-pack-year history of smoking and currently smoke or have quit within the past 15 years. Colorectal cancer screening. All adults should have this screening starting at age 70 and continuing until  age 35. Your health care provider may recommend screening at age 71 if you are at increased risk. You will have tests every 1-10 years, depending on your results and the type of screening test. Diabetes screening. This is done by checking your blood sugar (glucose) after you have not eaten for a while (fasting). You may have this done every 1-3  years. Mammogram. This may be done every 1-2 years. Talk with your health care provider about how often you should have regular mammograms. Abdominal aortic aneurysm (AAA) screening. You may need this if you are a current or former smoker. BRCA-related cancer screening. This may be done if you have a family history of breast, ovarian, tubal, or peritoneal cancers. Other tests STD (sexually transmitted disease) testing, if you are at risk. Bone density scan. This is done to screen for osteoporosis. You may have this done starting at age 42. Talk with your health care provider about your test results, treatment options,and if necessary, the need for more tests. Follow these instructions at home: Eating and drinking  Eat a diet that includes fresh fruits and vegetables, whole grains, lean protein, and low-fat dairy products. Limit your intake of foods with high amounts of sugar, saturated fats, and salt. Take vitamin and mineral supplements as recommended by your health care provider. Do not drink alcohol if your health care provider tells you not to drink. If you drink alcohol: Limit how much you have to 0-1 drink a day. Be aware of how much alcohol is in your drink. In the [place] one drink equals one 12 oz bottle of beer (355 mL), one 5 oz glass of wine (148 mL), or one 1 oz glass of hard liquor (44 mL).  Lifestyle Take daily care of your teeth and gums. Brush your teeth every morning and night with fluoride toothpaste. Floss one time each day. Stay active. Exercise for at least 30 minutes 5 or more days each week. Do not use any products that contain nicotine or tobacco, such as cigarettes, e-cigarettes, and chewing tobacco. If you need help quitting, ask your health care provider. Do not use drugs. If you are sexually active, practice safe sex. Use a condom or other form of protection in order to prevent STIs (sexually transmitted infections). Talk with your health care provider about  taking a low-dose aspirin or statin. Find healthy ways to cope with stress, such as: Meditation, yoga, or listening to music. Journaling. Talking to a trusted person. Spending time with friends and family. Safety Always wear your seat belt while driving or riding in a vehicle. Do not drive: If you have been drinking alcohol. Do not ride with someone who has been drinking. When you are tired or distracted. While texting. Wear a helmet and other protective equipment during sports activities. If you have firearms in your house, make sure you follow all gun safety procedures. What's next? Visit your health care provider once a year for an annual wellness visit. Ask your health care provider how often you should have your eyes and teeth checked. Stay up to date on all vaccines. This information is not intended to replace advice given to you by your health care provider. Make sure you discuss any questions you have with your healthcare provider. Document Revised: 08/22/2020 Document Reviewed: 08/26/2018 Elsevier Patient Education  2022 [employer].  is the new COVID medication we can give you if you get COVID so make sure you test if you have symptoms because we have to treat  by day 5 of symptoms for it to be effective. If you are positive let us know so we can treat. If a home test is negative and your symptoms are persistent get a PCR test. Can check testing locations at [email or web address] If you are positive we will make an appointment with [place] and we will send in Paxlovid if you would like it. Check with your pharmacy before we meet to confirm they have it in stock, if they do not then we can get the prescription at the [facility]

## 2021-04-01 NOTE — Progress Notes (Signed)
Subjective:   By signing my name below, I, Shehryar Baig, attest that this documentation has been prepared under the direction and in the presence of  Dr. Mosie Lukes, MD. 04/01/2021    Patient ID: Brenda Lara, female    DOB: Apr 09, 1951, 70 y.o.   MRN: [id number]  No chief complaint on file.   HPI Patient is in today for a comprehensive physical exam. She also complains of constant lower back pain that worsened over the last 2 months. She reports that the pain is worsened by standing finds relief laying down for a period of time. She also notes not sleeping for more than 5-6 hours because her back feels tight and agitated after laying down for a while. She had a diskectomy on her L4 and L5 disks in her 20's which was caused by heavy lifting at her workplace. She reports having a spot on her left cheek that itches for the past couple of months. She states that it comes and goes. She also has a skin spot on her hip that gets worse in the winter and has been there for a long period of time. She is diagnosed with osteopetrosis and is managing her symptoms by taking  Caltrate 2x daily PO and 1000 mg vitamin D daily PO and reports doing well on it.  She denies dysuria, recent falls, frequency, urgency, numbness,chest pain, breathing problems and tingling. She is willing to get the Hepatitis C screening at this time. She has had 3 Covid-19 vaccines at this time. She is UTD on her pneumonia vaccines. She is UTD on her tetanus vaccines and is due to get one later this year. She is not willing to get the shingles vaccine at this time but will think about it. She does not participate in exercise frequently but has been trying to manage a healthy diet. Her sister died at 59 y/o recently but she does not know the cause, otherwise there has been no changes to her family history.  Past Medical History:  Diagnosis Date   Allergy    Anemia    Angina pectoris    Anxiety    Cataract    Cervical  cancer screening 01/31/2016   Menarche at 12 Regular and moderate flow No history of abnormal pap in past G1P1, s/p 1 SVD No history of abnormal MGM No concerns today gyn surgeries b/l tubal ligation and b/y oophorectomy   Colon cancer ([place]) 2010   Coronary artery disease 2001/2005   S/P stenting to RCA and LAD   GERD (gastroesophageal reflux disease)    Hearing loss 08/05/2015   History of chicken pox    HTN (hypertension)    Menopause    Preventative health care 01/31/2016   Pure hypercholesterolemia    Vitamin D deficiency 06/01/2017    Past Surgical History:  Procedure Laterality Date   APPENDECTOMY  02/21/2009   BACK SURGERY  1978, 1988   multiple   BILATERAL OOPHORECTOMY  2010   CHOLECYSTECTOMY  11/09   COLON SURGERY  2010   right colectomy   COLONOSCOPY  01/26/2014   NORMAL    CORONARY STENT PLACEMENT  2001/2005   RCA   CYSTECTOMY     left ovary    Family History  Problem Relation Age of Onset   Hypertension Mother    Transient ischemic attack Mother    Heart attack Mother        age 63   Heart disease Mother    Leukemia  Mother    Heart attack Father        23's   Heart disease Father    Alcohol abuse Father    Arthritis Father        mva   Prostate cancer Father    Thyroid disease Daughter    Coronary artery disease Other    Colon cancer Neg Hx    Pancreatic cancer Neg Hx    Rectal cancer Neg Hx    Stomach cancer Neg Hx    Esophageal cancer Neg Hx     Social History   Socioeconomic History   Marital status: Married    Spouse name: Not on file   Number of children: Not on file   Years of education: Not on file   Highest education level: Not on file  Occupational History   Not on file  Tobacco Use   Smoking status: Never   Smokeless tobacco: Never  Vaping Use   Vaping Use: Never used  Substance and Sexual Activity   Alcohol use: No   Drug use: No   Sexual activity: Yes    Birth control/protection: Post-menopausal    Comment: lives with  husband, works Producer, 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, no dietary restrictions  Other Topics Concern   Not on file  Social History Narrative   Not on file   Social Determinants of Health   Financial Resource Strain: Not on file  Food Insecurity: Not on file  Transportation Needs: Not on file  Physical Activity: Not on file  Stress: Not on file  Social Connections: Not on file  Intimate Partner Violence: Not on file    Outpatient Medications Prior to Visit  Medication Sig Dispense Refill   aspirin EC 81 MG tablet Take 1 tablet (81 mg total) by mouth daily. Swallow whole. 30 tablet 11   atenolol (TENORMIN) 50 MG tablet TAKE 1 TABLET BY MOUTH TWICE A DAY 180 tablet 0   atorvastatin (LIPITOR) 40 MG tablet TAKE 1 TABLET BY MOUTH EVERY DAY 90 tablet 3   famotidine (PEPCID) 20 MG tablet TAKE 0.5-2 TABLETS (10-40 MG TOTAL) BY MOUTH AT BEDTIME AS NEEDED FOR HEARTBURN OR INDIGESTION. 180 tablet 1   hyoscyamine (LEVSIN SL) 0.125 MG SL tablet Place 1 tablet (0.125 mg total) under the tongue every 4 (four) hours as needed. 30 tablet 1   irbesartan (AVAPRO) 75 MG tablet TAKE 1 TABLET BY MOUTH EVERY DAY 90 tablet 1   meclizine (ANTIVERT) 25 MG tablet Take 1 tablet (25 mg total) by mouth 3 (three) times daily as needed for dizziness. 90 tablet 0   Multiple Vitamin (MULTIVITAMIN) tablet Take 1 tablet by mouth daily.     omeprazole (PRILOSEC) 40 MG capsule Take 1 capsule (40 mg total) by mouth daily. 90 capsule 1   topiramate (TOPAMAX) 100 MG tablet Take 100 mg by mouth daily.      No facility-administered medications prior to visit.    No Known Allergies  Review of Systems  Constitutional:  Negative for fever.  Respiratory:  Negative for shortness of breath.   Cardiovascular:  Negative for chest pain and palpitations.  Gastrointestinal:  Negative for nausea and vomiting.  Genitourinary:  Negative for dysuria and frequency.  Musculoskeletal:  Positive for back pain (lower). Negative for falls.  Skin:  Negative for rash.        (+)itchy spot on right hip (+)itchy spot on left cheek  Neurological:  Negative for headaches.  Endo/Heme/Allergies:  Negative for environmental allergies.  Psychiatric/Behavioral:  The  patient is not nervous/anxious.       Objective:    Physical Exam Constitutional:      General: She is not in acute distress. HENT:     Head: Normocephalic and atraumatic.     Right Ear: Tympanic membrane, ear canal and external ear normal.     Left Ear: Tympanic membrane, ear canal and external ear normal.  Eyes:     Extraocular Movements: Extraocular movements intact.     Conjunctiva/sclera: Conjunctivae normal.     Pupils: Pupils are equal, round, and reactive to light.     Comments: (-) nystagmus  Cardiovascular:     Rate and Rhythm: Normal rate and regular rhythm.     Pulses: Normal pulses.     Heart sounds: Normal heart sounds. No murmur heard. Pulmonary:     Effort: Pulmonary effort is normal. No respiratory distress.     Breath sounds: Normal breath sounds.  Abdominal:     General: Bowel sounds are normal. There is no distension.     Palpations: Abdomen is soft.     Tenderness: There is no abdominal tenderness.     Comments: Tenderness 1 fingerbreadth below the coastal margin.  Musculoskeletal:        General: No deformity.     Cervical back: Neck supple.  Lymphadenopathy:     Cervical: No cervical adenopathy.  Skin:    General: Skin is warm and dry.     Comments: Circular, scaly spot with erythema on her left cheek  Neurological:     Mental Status: She is alert and oriented to person, place, and time.  Psychiatric:        Behavior: Behavior normal.    BP 130/68   Pulse 76   Temp 97.9 F (36.6 C)   Resp 16   Ht 5\' 1"  (1.549 m)   Wt 141 lb 6.4 oz (64.1 kg)   SpO2 99%   BMI 26.72 kg/m  Wt Readings from Last 3 Encounters:  04/01/21 141 lb 6.4 oz (64.1 kg)  06/21/20 137 lb 6.4 oz (62.3 kg)  06/05/20 138 lb (62.6 kg)    Diabetic Foot Exam - Simple   No data  filed    Lab Results  Component Value Date   WBC 7.3 04/01/2021   HGB 11.7 (L) 04/01/2021   HCT 34.6 (L) 04/01/2021   PLT 259.0 04/01/2021   GLUCOSE 92 04/01/2021   CHOL 128 04/01/2021   TRIG 110.0 04/01/2021   HDL 60.00 04/01/2021   LDLCALC 46 04/01/2021   ALT 14 04/01/2021   AST 20 04/01/2021   NA 136 04/01/2021   K 3.8 04/01/2021   CL 103 04/01/2021   CREATININE 0.70 04/01/2021   BUN 21 04/01/2021   CO2 25 04/01/2021   TSH 1.58 04/01/2021   INR 0.95 05/13/2011   HGBA1C 6.0 04/01/2021    Lab Results  Component Value Date   TSH 1.58 04/01/2021   Lab Results  Component Value Date   WBC 7.3 04/01/2021   HGB 11.7 (L) 04/01/2021   HCT 34.6 (L) 04/01/2021   MCV 83.1 04/01/2021   PLT 259.0 04/01/2021   Lab Results  Component Value Date   NA 136 04/01/2021   K 3.8 04/01/2021   CO2 25 04/01/2021   GLUCOSE 92 04/01/2021   BUN 21 04/01/2021   CREATININE 0.70 04/01/2021   BILITOT 0.4 04/01/2021   ALKPHOS 88 04/01/2021   AST 20 04/01/2021   ALT 14 04/01/2021   PROT 6.6 04/01/2021  ALBUMIN 4.2 04/01/2021   CALCIUM 9.3 04/01/2021   GFR 88.13 04/01/2021   Lab Results  Component Value Date   CHOL 128 04/01/2021   Lab Results  Component Value Date   HDL 60.00 04/01/2021   Lab Results  Component Value Date   LDLCALC 46 04/01/2021   Lab Results  Component Value Date   TRIG 110.0 04/01/2021   Lab Results  Component Value Date   CHOLHDL 2 04/01/2021   Lab Results  Component Value Date   HGBA1C 6.0 04/01/2021    Mammogram: Last done on 04/23/2020. Results were normal. Repeat in one year. Scheduled for another one on 04/29/2021. Pap smear: Last done on 01/31/2016. Results were normal. Dexa: Last done on 02/02/2020. Results showed that she is osteoporotic according to the [facility]. Repeat in 2 years. Colonoscopy: Last done on 03/14/2019. Results showed one 40mm polyp in the transverse colon which was removed with a cold snare. Otherwise  results were normal. Repeat in 5 years.      Assessment & Plan:   Problem List Items Addressed This Visit     HYPERCHOLESTEROLEMIA    Encourage heart healthy diet such as MIND or DASH diet, increase exercise, avoid trans fats, simple carbohydrates and processed foods, consider a krill or fish or flaxseed oil cap daily. Tolerating Atorvastatin       Relevant Orders   Lipid panel (Completed)   Essential hypertension    Well controlled, no changes to meds. Encouraged heart healthy diet such as the DASH diet and exercise as tolerated.        Relevant Orders   CBC (Completed)   Comprehensive metabolic panel (Completed)   TSH (Completed)   Preventative health care    Patient encouraged to maintain heart healthy diet, regular exercise, adequate sleep. Consider daily probiotics. Take medications as prescribed. Labs ordered and reviewed. Last colonoscopy in June 2020 repeat in 2025. Last [facility] August 2021. She will consider a pap in the future.        Vitamin D deficiency    Supplement and monitor       Relevant Orders   VITAMIN D 25 Hydroxy (Vit-D Deficiency, Fractures) (Completed)   Sun-damaged skin    Referred to dermatology       Relevant Orders   Ambulatory referral to Dermatology   Low back pain    Encouraged moist heat and gentle stretching as tolerated. May try NSAIDs and prescription meds as directed and report if symptoms worsen or seek immediate care, check xray and referred to ortho       Relevant Orders   DG Lumbar Spine 2-3 Views (Completed)   Ambulatory referral to Orthopedic Surgery   Hepatomegaly    Liver palpable 1 finger breadth below costal margin and tender to palpation. Proceed with ultrasound and blood work.        Relevant Orders   US Abdomen Complete   Other Visit Diagnoses     Skin lesions    -  Primary   Relevant Orders   Ambulatory referral to Dermatology   Hyperglycemia       Relevant Orders   Hemoglobin A1c (Completed)   Encounter  for hepatitis C screening test for low risk patient       Relevant Orders   Hepatitis C Antibody (Completed)        No orders of the defined types were placed in this encounter.   I, Dr. Charlett Blake, Bonnita Levan, MD. , personally preformed the services described  in this documentation.  All medical record entries made by the scribe were at my direction and in my presence.  I have reviewed the chart and discharge instructions (if applicable) and agree that the record reflects my personal performance and is accurate and complete. 04/01/2021   I,Shehryar Baig,acting as a scribe for Penni Homans, MD.,have documented all relevant documentation on the behalf of Penni Homans, MD,as directed by  Penni Homans, MD while in the presence of Penni Homans, MD.   Penni Homans, MD

## 2021-04-02 LAB — HEPATITIS C ANTIBODY
Hepatitis C Ab: NONREACTIVE
SIGNAL TO CUT-OFF: 0.01 (ref <1.00)

## 2021-04-02 LAB — LIPID PANEL
Cholesterol: 128 mg/dL (ref 0–200)
HDL: 60 mg/dL (ref >39.00)
LDL Cholesterol: 46 mg/dL (ref 0–99)
NonHDL: 67.77
Total CHOL/HDL Ratio: 2
Triglycerides: 110 mg/dL (ref 0.0–149.0)
VLDL: 22 mg/dL (ref 0.0–40.0)

## 2021-04-02 LAB — COMPREHENSIVE METABOLIC PANEL
ALT: 14 U/L (ref 0–35)
AST: 20 U/L (ref 0–37)
Albumin: 4.2 g/dL (ref 3.5–5.2)
Alkaline Phosphatase: 88 U/L (ref 39–117)
BUN: 21 mg/dL (ref 6–23)
CO2: 25 mEq/L (ref 19–32)
Calcium: 9.3 mg/dL (ref 8.4–10.5)
Chloride: 103 mEq/L (ref 96–112)
Creatinine, Ser: 0.7 mg/dL (ref 0.40–1.20)
GFR: 88.13 mL/min (ref >60.00)
Glucose, Bld: 92 mg/dL (ref 70–99)
Potassium: 3.8 mEq/L (ref 3.5–5.1)
Sodium: 136 mEq/L (ref 135–145)
Total Bilirubin: 0.4 mg/dL (ref 0.2–1.2)
Total Protein: 6.6 g/dL (ref 6.0–8.3)

## 2021-04-02 LAB — CBC
HCT: 34.6 % — ABNORMAL LOW (ref 36.0–46.0)
Hemoglobin: 11.7 g/dL — ABNORMAL LOW (ref 12.0–15.0)
MCHC: 33.7 g/dL (ref 30.0–36.0)
MCV: 83.1 fl (ref 78.0–100.0)
Platelets: 259 10*3/uL (ref 150.0–400.0)
RBC: 4.16 Mil/uL (ref 3.87–5.11)
RDW: 14.1 % (ref 11.5–15.5)
WBC: 7.3 10*3/uL (ref 4.0–10.5)

## 2021-04-02 LAB — HEMOGLOBIN A1C: Hgb A1c MFr Bld: 6 % (ref 4.6–6.5)

## 2021-04-02 LAB — VITAMIN D 25 HYDROXY (VIT D DEFICIENCY, FRACTURES): VITD: 30.43 ng/mL (ref 30.00–100.00)

## 2021-04-02 LAB — TSH: TSH: 1.58 u[IU]/mL (ref 0.35–5.50)

## 2021-04-03 DIAGNOSIS — M545 Low back pain, unspecified: Secondary | ICD-10-CM | POA: Insufficient documentation

## 2021-04-03 DIAGNOSIS — M549 Dorsalgia, unspecified: Secondary | ICD-10-CM | POA: Insufficient documentation

## 2021-04-03 DIAGNOSIS — L578 Other skin changes due to chronic exposure to nonionizing radiation: Secondary | ICD-10-CM | POA: Insufficient documentation

## 2021-04-03 DIAGNOSIS — R160 Hepatomegaly, not elsewhere classified: Secondary | ICD-10-CM | POA: Insufficient documentation

## 2021-04-03 NOTE — Assessment & Plan Note (Signed)
Encourage heart healthy diet such as MIND or DASH diet, increase exercise, avoid trans fats, simple carbohydrates and processed foods, consider a krill or fish or flaxseed oil cap daily. Tolerating Atorvastatin 

## 2021-04-03 NOTE — Assessment & Plan Note (Signed)
Referred to dermatology 

## 2021-04-03 NOTE — Assessment & Plan Note (Signed)
Well controlled, no changes to meds. Encouraged heart healthy diet such as the DASH diet and exercise as tolerated.  °

## 2021-04-03 NOTE — Assessment & Plan Note (Signed)
Encouraged moist heat and gentle stretching as tolerated. May try NSAIDs and prescription meds as directed and report if symptoms worsen or seek immediate care, check xray and referred to ortho

## 2021-04-03 NOTE — Assessment & Plan Note (Signed)
Supplement and monitor 

## 2021-04-03 NOTE — Assessment & Plan Note (Addendum)
Patient encouraged to maintain heart healthy diet, regular exercise, adequate sleep. Consider daily probiotics. Take medications as prescribed. Labs ordered and reviewed. Last colonoscopy in June 2020 repeat in 2025. Last [facility] August 2021. She will consider a pap in the future.

## 2021-04-03 NOTE — Assessment & Plan Note (Signed)
Liver palpable 1 finger breadth below costal margin and tender to palpation. Proceed with ultrasound and blood work.

## 2021-04-08 ENCOUNTER — Other Ambulatory Visit: Payer: Self-pay | Admitting: Internal Medicine

## 2021-04-11 ENCOUNTER — Telehealth: Payer: Self-pay | Admitting: *Deleted

## 2021-04-11 DIAGNOSIS — D649 Anemia, unspecified: Secondary | ICD-10-CM

## 2021-04-11 NOTE — Telephone Encounter (Signed)
Order placed, dx anemia per PCP.

## 2021-04-11 NOTE — Telephone Encounter (Signed)
Pt returned an IFOB specimen but I do not see future orders in Epic. What is the reason for testing and I will place the order for you?

## 2021-04-12 ENCOUNTER — Other Ambulatory Visit (INDEPENDENT_AMBULATORY_CARE_PROVIDER_SITE_OTHER): Payer: Medicare Other

## 2021-04-12 ENCOUNTER — Other Ambulatory Visit: Payer: Self-pay | Admitting: Family Medicine

## 2021-04-12 ENCOUNTER — Telehealth: Payer: Self-pay | Admitting: *Deleted

## 2021-04-12 DIAGNOSIS — D649 Anemia, unspecified: Secondary | ICD-10-CM | POA: Diagnosis not present

## 2021-04-12 DIAGNOSIS — K625 Hemorrhage of anus and rectum: Secondary | ICD-10-CM

## 2021-04-12 LAB — FECAL OCCULT BLOOD, IMMUNOCHEMICAL: Fecal Occult Bld: POSITIVE — AB

## 2021-04-12 NOTE — Telephone Encounter (Signed)
Dr Nani Ravens -- Can you review in PCP's absence?    Received call from [place] at [place] lab reporting positive IFOB on Chela Turrell.

## 2021-04-12 NOTE — Telephone Encounter (Signed)
Please place referral to GI or have her reach out to Dr. Blanch Media team for this. Ty.

## 2021-04-12 NOTE — Telephone Encounter (Signed)
Called the patient informed of results. She did agree to call Dr. Blanch Media office to schedule an appointment, I did however also put in the referral, but I did instruct her that since she was an established patient she may get an appointment before a referral could get processed. She verbalized understanding.

## 2021-04-16 ENCOUNTER — Encounter: Payer: Self-pay | Admitting: Family Medicine

## 2021-04-16 ENCOUNTER — Other Ambulatory Visit: Payer: Self-pay

## 2021-04-16 DIAGNOSIS — D649 Anemia, unspecified: Secondary | ICD-10-CM

## 2021-04-16 MED ORDER — FERROUS FUMARATE 325 (106 FE) MG PO TABS: 1.0000 | ORAL_TABLET | Freq: Every day | ORAL | 2 refills | Status: DC

## 2021-04-29 ENCOUNTER — Encounter (HOSPITAL_BASED_OUTPATIENT_CLINIC_OR_DEPARTMENT_OTHER): Payer: Self-pay

## 2021-04-29 ENCOUNTER — Ambulatory Visit (HOSPITAL_BASED_OUTPATIENT_CLINIC_OR_DEPARTMENT_OTHER)
Admission: RE | Admit: 2021-04-29 | Discharge: 2021-04-29 | Disposition: A | Discharge status: Home / Self Care | Payer: Medicare Other | Source: Ambulatory Visit | Attending: Family Medicine | Admitting: Family Medicine

## 2021-04-29 ENCOUNTER — Other Ambulatory Visit: Payer: Self-pay

## 2021-04-29 DIAGNOSIS — I7090 Unspecified atherosclerosis: Secondary | ICD-10-CM | POA: Diagnosis not present

## 2021-04-29 DIAGNOSIS — Z1231 Encounter for screening mammogram for malignant neoplasm of breast: Secondary | ICD-10-CM | POA: Diagnosis not present

## 2021-04-29 DIAGNOSIS — K7689 Other specified diseases of liver: Secondary | ICD-10-CM | POA: Diagnosis not present

## 2021-04-29 DIAGNOSIS — R160 Hepatomegaly, not elsewhere classified: Secondary | ICD-10-CM | POA: Diagnosis not present

## 2021-04-29 DIAGNOSIS — Z9049 Acquired absence of other specified parts of digestive tract: Secondary | ICD-10-CM | POA: Insufficient documentation

## 2021-05-02 ENCOUNTER — Other Ambulatory Visit (INDEPENDENT_AMBULATORY_CARE_PROVIDER_SITE_OTHER): Payer: Medicare Other

## 2021-05-02 ENCOUNTER — Other Ambulatory Visit: Payer: Self-pay

## 2021-05-02 DIAGNOSIS — D649 Anemia, unspecified: Secondary | ICD-10-CM

## 2021-05-02 LAB — CBC WITH DIFFERENTIAL/PLATELET
Basophils Absolute: 0 10*3/uL (ref 0.0–0.1)
Basophils Relative: 0.4 % (ref 0.0–3.0)
Eosinophils Absolute: 0.1 10*3/uL (ref 0.0–0.7)
Eosinophils Relative: 1.2 % (ref 0.0–5.0)
HCT: 37.2 % (ref 36.0–46.0)
Hemoglobin: 12.1 g/dL (ref 12.0–15.0)
Lymphocytes Relative: 29.7 % (ref 12.0–46.0)
Lymphs Abs: 1.6 10*3/uL (ref 0.7–4.0)
MCHC: 32.7 g/dL (ref 30.0–36.0)
MCV: 84.6 fl (ref 78.0–100.0)
Monocytes Absolute: 0.5 10*3/uL (ref 0.1–1.0)
Monocytes Relative: 10 % (ref 3.0–12.0)
Neutro Abs: 3.1 10*3/uL (ref 1.4–7.7)
Neutrophils Relative %: 58.7 % (ref 43.0–77.0)
Platelets: 262 10*3/uL (ref 150.0–400.0)
RBC: 4.39 Mil/uL (ref 3.87–5.11)
RDW: 14.6 % (ref 11.5–15.5)
WBC: 5.3 10*3/uL (ref 4.0–10.5)

## 2021-05-02 LAB — IBC + FERRITIN
Ferritin: 18.9 ng/mL (ref 10.0–291.0)
Iron: 148 ug/dL — ABNORMAL HIGH (ref 42–145)
Saturation Ratios: 36.6 % (ref 20.0–50.0)
TIBC: 404.6 ug/dL (ref 250.0–450.0)
Transferrin: 289 mg/dL (ref 212.0–360.0)

## 2021-05-03 ENCOUNTER — Other Ambulatory Visit (INDEPENDENT_AMBULATORY_CARE_PROVIDER_SITE_OTHER): Payer: Medicare Other

## 2021-05-03 ENCOUNTER — Other Ambulatory Visit: Payer: Self-pay | Admitting: Family Medicine

## 2021-05-03 ENCOUNTER — Telehealth: Payer: Self-pay

## 2021-05-03 DIAGNOSIS — D649 Anemia, unspecified: Secondary | ICD-10-CM

## 2021-05-03 DIAGNOSIS — K625 Hemorrhage of anus and rectum: Secondary | ICD-10-CM

## 2021-05-03 LAB — FECAL OCCULT BLOOD, IMMUNOCHEMICAL: Fecal Occult Bld: POSITIVE — AB

## 2021-05-03 NOTE — Telephone Encounter (Signed)
CRITICAL VALUE STICKER  CRITICAL VALUE: positive iFob  RECEIVER (on-site recipient of call): [facility] RMA  [place] NOTIFIED:  3: 25 Pm, 05/03/2021  MESSENGER (representative from lab): Poly  MD NOTIFIED: yes  TIME OF NOTIFICATION: 3: 25 Pm, 05/03/2021  RESPONSE: Pending

## 2021-05-03 NOTE — Telephone Encounter (Signed)
Please see below in Dr Charlett Blake and [facility] absence.

## 2021-05-03 NOTE — Progress Notes (Signed)
cbc

## 2021-05-03 NOTE — Telephone Encounter (Signed)
Patient informed of results/PCP instructions. Scheduled lab appointment next week/put in lab order. The patient verbalized understanding.

## 2021-05-10 ENCOUNTER — Other Ambulatory Visit (INDEPENDENT_AMBULATORY_CARE_PROVIDER_SITE_OTHER): Payer: Medicare Other

## 2021-05-10 ENCOUNTER — Other Ambulatory Visit: Payer: Self-pay

## 2021-05-10 DIAGNOSIS — D649 Anemia, unspecified: Secondary | ICD-10-CM | POA: Diagnosis not present

## 2021-05-10 DIAGNOSIS — K625 Hemorrhage of anus and rectum: Secondary | ICD-10-CM | POA: Diagnosis not present

## 2021-05-10 LAB — IBC + FERRITIN
Ferritin: 10.1 ng/mL (ref 10.0–291.0)
Iron: 47 ug/dL (ref 42–145)
Saturation Ratios: 11.2 % — ABNORMAL LOW (ref 20.0–50.0)
TIBC: 418.6 ug/dL (ref 250.0–450.0)
Transferrin: 299 mg/dL (ref 212.0–360.0)

## 2021-05-10 LAB — CBC
HCT: 37.7 % (ref 36.0–46.0)
Hemoglobin: 12.2 g/dL (ref 12.0–15.0)
MCHC: 32.3 g/dL (ref 30.0–36.0)
MCV: 85.6 fl (ref 78.0–100.0)
Platelets: 265 10*3/uL (ref 150.0–400.0)
RBC: 4.41 Mil/uL (ref 3.87–5.11)
RDW: 14.7 % (ref 11.5–15.5)
WBC: 6.5 10*3/uL (ref 4.0–10.5)

## 2021-05-23 ENCOUNTER — Encounter: Payer: Self-pay | Admitting: Internal Medicine

## 2021-05-23 ENCOUNTER — Ambulatory Visit: Payer: Medicare Other | Admitting: Internal Medicine

## 2021-05-23 VITALS — BP 170/70 | HR 64 | Ht 61.0 in | Wt 139.0 lb

## 2021-05-23 DIAGNOSIS — R195 Other fecal abnormalities: Secondary | ICD-10-CM

## 2021-05-23 DIAGNOSIS — D649 Anemia, unspecified: Secondary | ICD-10-CM | POA: Diagnosis not present

## 2021-05-23 DIAGNOSIS — K219 Gastro-esophageal reflux disease without esophagitis: Secondary | ICD-10-CM | POA: Diagnosis not present

## 2021-05-23 MED ORDER — SUPREP BOWEL PREP KIT 17.5-3.13-1.6 GM/177ML PO SOLN: 1.0000 | ORAL | 0 refills | Status: DC

## 2021-05-23 NOTE — Patient Instructions (Signed)
If you are age 70 or older, your body mass index should be between 23-30. Your Body mass index is 26.26 kg/m. If this is out of the aforementioned range listed, please consider follow up with your Primary Care Provider.  The [facility] providers would like to encourage you to use [facility] to communicate with providers for non-urgent requests or questions.  Due to long hold times on the telephone, sending your provider a message by [facility] may be faster and more efficient way to get a response. Please allow 48 business hours for a response.  Please remember that this is for non-urgent requests/questions.   PROCEDURES: You have been scheduled for an EGD and Colonoscopy. Please follow the written instructions given to you at your visit today. Please pick up your prep supplies at the pharmacy within the next 1-3 days. If you use inhalers (even only as needed), please bring them with you on the day of your procedure.  It was great seeing you today! Thank you for entrusting me with your care and choosing [facility].  Dr. Henrene Pastor

## 2021-05-23 NOTE — Progress Notes (Signed)
HISTORY OF PRESENT ILLNESS:  Brenda Lara is a 70 y.o. female with past medical history as listed below who was sent today for mild anemia and Hemoccult positive stool on 2 separate occasions.  Patient has a history of colon cancer status post right hemicolectomy elsewhere 2010.  She also has chronic GERD which is managed with a combination of omeprazole and famotidine.  Review of blood work from July 2022 shows hemoglobin of 11.7.  MCV 83.1.  Iron saturation from May 10, 2021 was low at 11.2.  Borderline low ferritin at 10.1.  Patient did have some transient right-sided abdominal discomfort for which an abdominal ultrasound was ordered and performed April 29, 2021 the exam was unremarkable.  She is status postcholecystectomy.  Incidental stable hepatic cysts present.  Hemoccult studies were positive on 2 separate occasions.  She denies melena or hematochezia.  She did undergo complete colonoscopy March 14, 2019.  She was found to have a diminutive tubular adenoma which was removed.  Otherwise unremarkable postop examination.  Upper endoscopy was performed September 2020 to evaluate dyspepsia.  The exam was unremarkable.  Patient's GI review of systems is otherwise unremarkable.  REVIEW OF SYSTEMS:  All non-GI ROS negative unless otherwise stated in the HPI except for back pain, ankle swelling  Past Medical History:  Diagnosis Date   Allergy    Anemia    Angina pectoris    Anxiety    Cataract    Cervical cancer screening 01/31/2016   Menarche at 12 Regular and moderate flow No history of abnormal pap in past G1P1, s/p 1 SVD No history of abnormal MGM No concerns today gyn surgeries b/l tubal ligation and b/y oophorectomy   Colon cancer ([place]) 2010   Coronary artery disease 2001/2005   S/P stenting to RCA and LAD   GERD (gastroesophageal reflux disease)    Hearing loss 08/05/2015   History of chicken pox    HTN (hypertension)    Menopause    Preventative health care 01/31/2016   Pure  hypercholesterolemia    Vitamin D deficiency 06/01/2017    Past Surgical History:  Procedure Laterality Date   APPENDECTOMY  02/21/2009   BACK SURGERY  1978, 1988   multiple   BILATERAL OOPHORECTOMY  2010   CHOLECYSTECTOMY  11/09   COLON SURGERY  2010   right colectomy   COLONOSCOPY  01/26/2014   NORMAL    CORONARY STENT PLACEMENT  2001/2005   RCA   CYSTECTOMY     left ovary    Social History [name]  reports that she has never smoked. She has never used smokeless tobacco. She reports that she does not drink alcohol and does not use drugs.  family history includes Alcohol abuse in her father; Arthritis in her father; Coronary artery disease in an other family member; Heart attack in her father and mother; Heart disease in her father and mother; Hypertension in her mother; Leukemia in her mother; Prostate cancer in her father; Thyroid disease in her daughter; Transient ischemic attack in her mother.  No Known Allergies     PHYSICAL EXAMINATION: Vital signs: BP (!) 170/70   Pulse 64   Ht '5\' 1"'$  (1.549 m)   Wt 139 lb (63 kg)   BMI 26.26 kg/m   Constitutional: generally well-appearing, no acute distress Psychiatric: alert and oriented x3, cooperative Eyes: extraocular movements intact, anicteric, conjunctiva pink Mouth: oral pharynx moist, no lesions Neck: supple no lymphadenopathy Cardiovascular: heart regular rate and rhythm, no murmur  Lungs: clear to auscultation bilaterally Abdomen: soft, nontender, nondistended, no obvious ascites, no peritoneal signs, normal bowel sounds, no organomegaly Rectal: Deferred till colonoscopy Extremities: no lower extremity edema bilaterally Skin: no lesions on visible extremities Neuro: No focal deficits.  Cranial nerves intact  ASSESSMENT:  1.  Mild iron deficiency anemia.  Rule out GI mucosal lesion 2.  Heme positive stool.  Rule out significant GI mucosal lesion. 3.  GERD.  Normal EGD 2 years ago 4.  Personal history of  colon cancer and adenomatous colon polyps..  Last colonoscopy June 2020 with small adenoma   PLAN:  1.  Reflux precautions 2.  Continue acid suppressive therapy 3.  Schedule upper endoscopy to evaluate heme positive stool and iron deficiency anemia.The nature of the procedure, as well as the risks, benefits, and alternatives were carefully and thoroughly reviewed with the patient. Ample time for discussion and questions allowed. The patient understood, was satisfied, and agreed to proceed.  4.  Schedule colonoscopy to evaluate heme positive stool and iron deficiency anemia.The nature of the procedure, as well as the risks, benefits, and alternatives were carefully and thoroughly reviewed with the patient. Ample time for discussion and questions allowed. The patient understood, was satisfied, and agreed to proceed.

## 2021-06-03 DIAGNOSIS — R519 Headache, unspecified: Secondary | ICD-10-CM | POA: Diagnosis not present

## 2021-06-03 DIAGNOSIS — G43719 Chronic migraine without aura, intractable, without status migrainosus: Secondary | ICD-10-CM | POA: Diagnosis not present

## 2021-06-03 DIAGNOSIS — G43019 Migraine without aura, intractable, without status migrainosus: Secondary | ICD-10-CM | POA: Diagnosis not present

## 2021-06-08 ENCOUNTER — Other Ambulatory Visit: Payer: Self-pay | Admitting: Cardiovascular Disease

## 2021-06-17 ENCOUNTER — Other Ambulatory Visit: Payer: Self-pay | Admitting: Family Medicine

## 2021-06-18 NOTE — Progress Notes (Signed)
Patient ID: Brenda Lara, female   DOB: 04/27/1951, 70 y.o.   MRN: [id number]     70 y.o. with distant history of CAD. Stenting of RCA in 2006 and LAD in 2005. Last cath in 2010 stents widely patent Last ETT 07/10/20 was normal. Statin changed to Lipitor in February 2019 with target LDL reached. Colon cancer in remission post colectomy EF is 55% with some apical hypokinesis but has not had echo in long time.   No angina Compliant with meds  Anginal equivalent is inner arm pain  Varicosities Venous duplex LLE negative for DVT March 2019  Husband retired and home more Seems to cramp her life style.    Some chronic lower back pain had disc surgery in her 30's Xray 04/02/21 with L45 degenerative changes   Tested positive for COVID 06/04/20 Has been vaccinated [place] had [place] who Tested negative 3 times  Has had heme  positive stool and getting EGD/Colon on Monday with Dr Henrene Pastor Also complaining of myalgias and pain from hips down to feet. Worse when sitting And better with exertion not vascular in nature   ROS: Denies fever, malais, weight loss, blurry vision, decreased visual acuity, cough, sputum, SOB, hemoptysis, pleuritic pain, palpitaitons, heartburn, abdominal pain, melena, lower extremity edema, claudication, or rash.  All other systems reviewed and negative  General: BP (!) 160/68   Pulse (!) 58   Ht 5' 1" (1.549 m)   Wt 139 lb (63 kg)   SpO2 97%   BMI 26.26 kg/m  Affect appropriate Healthy:  appears stated age 33: normal Neck supple with no adenopathy JVP normal no bruits no thyromegaly Lungs clear with no wheezing and good diaphragmatic motion Heart:  S1/S2 no murmur, no rub, gallop or click PMI normal Abdomen: benighn, BS positve, no tenderness, no AAA post colectomy  no bruit.  No HSM or HJR Distal pulses intact with no bruits No edema Neuro non-focal Skin warm and dry No muscular weakness   Current Outpatient Medications   Medication Sig Dispense Refill   ASPIRIN LOW DOSE 81 MG EC tablet TAKE 1 TABLET (81 MG TOTAL) BY MOUTH DAILY. SWALLOW WHOLE. 90 tablet 0   atenolol (TENORMIN) 50 MG tablet TAKE 1 TABLET BY MOUTH TWICE A DAY 180 tablet 1   atorvastatin (LIPITOR) 40 MG tablet TAKE 1 TABLET BY MOUTH EVERY DAY 90 tablet 3   famotidine (PEPCID) 20 MG tablet TAKE 0.5-2 TABLETS (10-40 MG TOTAL) BY MOUTH AT BEDTIME AS NEEDED FOR HEARTBURN OR INDIGESTION. 180 tablet 1   ferrous fumarate (HEMOCYTE - 106 MG FE) 325 (106 Fe) MG TABS tablet Take 1 tablet (106 mg of iron total) by mouth daily. 30 tablet 2   irbesartan (AVAPRO) 75 MG tablet TAKE 1 TABLET BY MOUTH EVERY DAY 90 tablet 1   meclizine (ANTIVERT) 25 MG tablet Take 1 tablet (25 mg total) by mouth 3 (three) times daily as needed for dizziness. 90 tablet 0   Multiple Vitamin (MULTIVITAMIN) tablet Take 1 tablet by mouth daily.     nitroGLYCERIN (NITROSTAT) 0.4 MG SL tablet Place 0.4 mg under the tongue every 5 (five) minutes as needed for chest pain.     omeprazole (PRILOSEC) 40 MG capsule TAKE 1 CAPSULE (40 MG TOTAL) BY MOUTH DAILY. 90 capsule 0   SUPREP BOWEL PREP KIT 17.5-3.13-1.6 GM/177ML SOLN Take 1 kit by mouth as directed. For colonoscopy prep 354 mL 0   topiramate (TOPAMAX) 100 MG tablet Take 100  mg by mouth daily.      hyoscyamine (LEVSIN SL) 0.125 MG SL tablet Place 1 tablet (0.125 mg total) under the tongue every 4 (four) hours as needed. (Patient not taking: Reported on 06/25/2021) 30 tablet 1   No current facility-administered medications for this visit.    Allergies  Patient has no known allergies.  Electrocardiogram:  06/21/20 SR rate 58 normal 06/25/2021 NSR normal   Assessment and Plan  CAD:  Distant history of RCA/LAD stenting  Patent by cath in 2010.  No angina, normal ECG ETT last 07/10/20 normal Continue medical RX  SL nitro refilled   Chol:  Changed to Lipitor 10/2017 with target LDL reached  Cholesterol is at goal.  Continue current dose  of statin and diet Rx.  No myalgias or side effects.  F/U  LFT's in 6 months. Lab Results  Component Value Date   LDLCALC 46 04/01/2021    Colon Cancer:  Colonoscopy normal last 2015  in remission fu GI.  EGD done 05/19/19 also normal Heme positive stool EGD/Colon Monday with Dr Henrene Pastor   GERD:  Continue zantac as needed  Vertigo:  Infrequent has antivert as needed   COVID:  Fully vaccinated tested positive 06/04/20 feels fine just had bad cough Had Moderna vaccine initially   Myalgias:  LE;s pulses are normal Hold statin for 6 weeks and see if it improves F/U primary ? R/o lumbar stenosis Check labs today to include ESR/ANA/CRP/CPK and LFTls   F/U in a year   Jenkins Rouge

## 2021-06-25 ENCOUNTER — Encounter: Payer: Self-pay | Admitting: Cardiovascular Disease

## 2021-06-25 ENCOUNTER — Other Ambulatory Visit: Payer: Self-pay

## 2021-06-25 ENCOUNTER — Ambulatory Visit: Payer: Medicare Other | Admitting: Cardiovascular Disease

## 2021-06-25 VITALS — BP 160/68 | HR 58 | Ht 61.0 in | Wt 139.0 lb

## 2021-06-25 DIAGNOSIS — M25552 Pain in left hip: Secondary | ICD-10-CM

## 2021-06-25 DIAGNOSIS — E785 Hyperlipidemia, unspecified: Secondary | ICD-10-CM

## 2021-06-25 DIAGNOSIS — M25551 Pain in right hip: Secondary | ICD-10-CM

## 2021-06-25 DIAGNOSIS — I2510 Atherosclerotic heart disease of native coronary artery without angina pectoris: Secondary | ICD-10-CM

## 2021-06-25 MED ORDER — NITROGLYCERIN 0.4 MG SL SUBL
0.4000 mg | SUBLINGUAL_TABLET | SUBLINGUAL | 3 refills | Status: DC | PRN
Start: 2021-06-25 — End: 2023-01-12

## 2021-06-25 NOTE — Patient Instructions (Addendum)
Medication Instructions:  *If you need a refill on your cardiac medications before your next appointment, please call your pharmacy*  Lab Work: Your physician recommends that you return for lab work in: RF, ESR, ANA, CRP, CPK, LFT  If you have labs (blood work) drawn today and your tests are completely normal, you will receive your results only by: [place] (if you have [place]) OR A paper copy in the mail If you have any lab test that is abnormal or we need to change your treatment, we will call you to review the results.  Testing/Procedures: None ordered today.  Follow-Up: At [facility], you and your health needs are our priority.  As part of our continuing mission to provide you with exceptional heart care, we have created designated Provider Care Teams.  These Care Teams include your primary Cardiologist (physician) and Advanced Practice Providers (APPs -  Physician Assistants and Nurse Practitioners) who all work together to provide you with the care you need, when you need it.  We recommend signing up for the patient portal called "MyChart".  Sign up information is provided on this After Visit Summary.  MyChart is used to connect with patients for Virtual Visits (Telemedicine).  Patients are able to view lab/test results, encounter notes, upcoming appointments, etc.  Non-urgent messages can be sent to your provider as well.   To learn more about what you can do with MyChart, go to [email or web address].    Your next appointment:   1 year(s)  The format for your next appointment:   In Person  Provider:   You may see Dr. Johnsie Cancel or one of the following Advanced Practice Providers on your designated Care Team:   Cecilie Kicks, NP

## 2021-06-26 LAB — HEPATIC FUNCTION PANEL
ALT: 21 IU/L (ref 0–32)
AST: 29 IU/L (ref 0–40)
Albumin: 4.6 g/dL (ref 3.8–4.8)
Alkaline Phosphatase: 113 IU/L (ref 44–121)
Bilirubin Total: 0.5 mg/dL (ref 0.0–1.2)
Bilirubin, Direct: 0.17 mg/dL (ref 0.00–0.40)
Total Protein: 6.7 g/dL (ref 6.0–8.5)

## 2021-06-26 LAB — ANA: Anti Nuclear Antibody (ANA): NEGATIVE

## 2021-06-26 LAB — CK: Total CK: 91 U/L (ref 32–182)

## 2021-06-26 LAB — SEDIMENTATION RATE: Sed Rate: 4 mm/hr (ref 0–40)

## 2021-06-26 LAB — RHEUMATOID FACTOR: Rheumatoid fact SerPl-aCnc: <10 IU/mL (ref <14.0)

## 2021-06-26 LAB — C-REACTIVE PROTEIN: CRP: <1 mg/L (ref 0–10)

## 2021-07-01 ENCOUNTER — Ambulatory Visit (AMBULATORY_SURGERY_CENTER): Payer: Medicare Other | Admitting: Internal Medicine

## 2021-07-01 ENCOUNTER — Encounter: Payer: Self-pay | Admitting: Internal Medicine

## 2021-07-01 VITALS — BP 133/75 | HR 79 | Temp 96.6°F | Resp 15 | Ht 61.0 in | Wt 139.0 lb

## 2021-07-01 DIAGNOSIS — R195 Other fecal abnormalities: Secondary | ICD-10-CM

## 2021-07-01 DIAGNOSIS — K648 Other hemorrhoids: Secondary | ICD-10-CM

## 2021-07-01 DIAGNOSIS — D649 Anemia, unspecified: Secondary | ICD-10-CM

## 2021-07-01 DIAGNOSIS — K5730 Diverticulosis of large intestine without perforation or abscess without bleeding: Secondary | ICD-10-CM | POA: Diagnosis not present

## 2021-07-01 DIAGNOSIS — D509 Iron deficiency anemia, unspecified: Secondary | ICD-10-CM

## 2021-07-01 DIAGNOSIS — K219 Gastro-esophageal reflux disease without esophagitis: Secondary | ICD-10-CM

## 2021-07-01 MED ORDER — SODIUM CHLORIDE 0.9 % IV SOLN: 500.0000 mL | Freq: Once | INTRAVENOUS | Status: DC

## 2021-07-01 NOTE — Progress Notes (Signed)
HISTORY OF PRESENT ILLNESS:  Brenda Lara is a 70 y.o. female who was evaluated in the office May 24, 2021 regarding mild iron deficiency anemia and Hemoccult positive stool.  See that dictation for details.  There have been no interval changes.  She is now for colonoscopy and upper endoscopy  REVIEW OF SYSTEMS:  All non-GI ROS negative.  Past Medical History:  Diagnosis Date   Allergy    Anemia    Angina pectoris    Anxiety    Cataract    Cervical cancer screening 01/31/2016   Menarche at 12 Regular and moderate flow No history of abnormal pap in past G1P1, s/p 1 SVD No history of abnormal MGM No concerns today gyn surgeries b/l tubal ligation and b/y oophorectomy   Colon cancer ([facility]) 2010   Coronary artery disease 2001/2005   S/P stenting to RCA and LAD   GERD (gastroesophageal reflux disease)    Hearing loss 08/05/2015   History of chicken pox    HTN (hypertension)    Menopause    Preventative health care 01/31/2016   Pure hypercholesterolemia    Vitamin D deficiency 06/01/2017    Past Surgical History:  Procedure Laterality Date   APPENDECTOMY  02/21/2009   BACK SURGERY  1978, 1988   multiple   BILATERAL OOPHORECTOMY  2010   CHOLECYSTECTOMY  11/09   COLON SURGERY  2010   right colectomy   COLONOSCOPY  01/26/2014   NORMAL    CORONARY STENT PLACEMENT  2001/2005   RCA   CYSTECTOMY     left ovary    Social History [employer]  reports that she has never smoked. She has never used smokeless tobacco. She reports that she does not drink alcohol and does not use drugs.  family history includes Alcohol abuse in her father; Arthritis in her father; Colon polyps in her mother; Coronary artery disease in an other family member; Heart attack in her father and mother; Heart disease in her father and mother; Hypertension in her mother; Leukemia in her mother; Prostate cancer in her father; Thyroid disease in her daughter; Transient ischemic attack in her mother.  No  Known Allergies     PHYSICAL EXAMINATION:  Vital signs: BP (!) 158/77 (Patient Position: Sitting)   Pulse 70   Temp (!) 96.6 F (35.9 C)   Ht 5\' 1"  (1.549 m)   Wt 139 lb (63 kg)   SpO2 98%   BMI 26.26 kg/m  General: Well-developed, well-nourished, no acute distress HEENT: Sclerae are anicteric, conjunctiva pink. Oral mucosa intact Lungs: Clear Heart: Regular Abdomen: soft, nontender, nondistended, no obvious ascites, no peritoneal signs, normal bowel sounds. No organomegaly. Extremities: No edema Psychiatric: alert and oriented x3. Cooperative     ASSESSMENT:  1.  Mild iron deficiency anemia.  Rule out GI mucosal lesion 2.  Heme positive stool.  Rule out significant GI mucosal lesion. 3.  GERD.  Normal EGD 2 years ago 4.  Personal history of colon cancer and adenomatous colon polyps..  Last colonoscopy June 2020 with small adenoma   PLAN:  1.  Colonoscopy and upper endoscopy

## 2021-07-01 NOTE — Progress Notes (Signed)
CHECK-IN-AM  V/S-DT 

## 2021-07-01 NOTE — Patient Instructions (Signed)
Resume previous diet and continue present medications. Start taking a multivitamin with iron daily. Repeat Colonoscopy in 5 years for surveillance.  YOU HAD AN ENDOSCOPIC PROCEDURE TODAY AT [facility]:   Refer to the procedure report that was given to you for any specific questions about what was found during the examination.  If the procedure report does not answer your questions, please call your gastroenterologist to clarify.  If you requested that your care partner not be given the details of your procedure findings, then the procedure report has been included in a sealed envelope for you to review at your convenience later.  YOU SHOULD EXPECT: Some feelings of bloating in the abdomen. Passage of more gas than usual.  Walking can help get rid of the air that was put into your GI tract during the procedure and reduce the bloating. If you had a lower endoscopy (such as a colonoscopy or flexible sigmoidoscopy) you may notice spotting of blood in your stool or on the toilet paper. If you underwent a bowel prep for your procedure, you may not have a normal bowel movement for a few days.  Please Note:  You might notice some irritation and congestion in your nose or some drainage.  This is from the oxygen used during your procedure.  There is no need for concern and it should clear up in a day or so.  SYMPTOMS TO REPORT IMMEDIATELY:  Following lower endoscopy (colonoscopy or flexible sigmoidoscopy):  Excessive amounts of blood in the stool  Significant tenderness or worsening of abdominal pains  Swelling of the abdomen that is new, acute  Fever of 100F or higher  Following upper endoscopy (EGD)  Vomiting of blood or coffee ground material  New chest pain or pain under the shoulder blades  Painful or persistently difficult swallowing  New shortness of breath  Fever of 100F or higher  Black, tarry-looking stools  For urgent or emergent issues, a gastroenterologist can be  reached at any hour by calling [phone]. Do not use MyChart messaging for urgent concerns.    DIET:  We do recommend a small meal at first, but then you may proceed to your regular diet.  Drink plenty of fluids but you should avoid alcoholic beverages for 24 hours.  ACTIVITY:  You should plan to take it easy for the rest of today and you should NOT DRIVE or use heavy machinery until tomorrow (because of the sedation medicines used during the test).    FOLLOW UP: Our staff will call the number listed on your records 48-72 hours following your procedure to check on you and address any questions or concerns that you may have regarding the information given to you following your procedure. If we do not reach you, we will leave a message.  We will attempt to reach you two times.  During this call, we will ask if you have developed any symptoms of COVID 19. If you develop any symptoms (ie: fever, flu-like symptoms, shortness of breath, cough etc.) before then, please call [phone].  If you test positive for Covid 19 in the 2 weeks post procedure, please call and report this information to [place].    If any biopsies were taken you will be contacted by phone or by letter within the next 1-3 weeks.  Please call us at [phone] if you have not heard about the biopsies in 3 weeks.    SIGNATURES/CONFIDENTIALITY: You and/or your care partner have signed paperwork which will  be entered into your electronic medical record.  These signatures attest to the fact that that the information above on your After Visit Summary has been reviewed and is understood.  Full responsibility of the confidentiality of this discharge information lies with you and/or your care-partner.

## 2021-07-01 NOTE — Op Note (Signed)
[facility] Patient Name: Ethelda Deangelo Procedure Date: 07/01/2021 2:21 PM MRN: [id number] Endoscopist: Docia Chuck. Henrene Pastor , MD Age: 70 Referring MD:  Date of Birth: [birth year] Gender: Female Account #: [id number] Procedure:                Colonoscopy Indications:              Heme positive stool, Iron deficiency anemia. Also a                            personal history of colon cancer status post right                            hemicolectomy 2010. Negative surveillance                            colonoscopy 2012 and 2015. Last colonoscopy June                            2020 with small tubular adenoma. Medicines:                Monitored Anesthesia Care Procedure:                Pre-Anesthesia Assessment:                           - Prior to the procedure, a History and Physical                            was performed, and patient medications and                            allergies were reviewed. The patient's tolerance of                            previous anesthesia was also reviewed. The risks                            and benefits of the procedure and the sedation                            options and risks were discussed with the patient.                            All questions were answered, and informed consent                            was obtained. Prior Anticoagulants: The patient has                            taken no previous anticoagulant or antiplatelet                            agents. ASA Grade Assessment: II - A patient with  mild systemic disease. After reviewing the risks                            and benefits, the patient was deemed in                            satisfactory condition to undergo the procedure.                           After obtaining informed consent, the colonoscope                            was passed under direct vision. Throughout the                            procedure, the patient's blood  pressure, pulse, and                            oxygen saturations were monitored continuously. The                            Olympus PCF-H190DL ([id number]) Colonoscope was                            introduced through the anus and advanced to the the                            cecum, identified by appendiceal orifice and                            ileocecal valve. The terminal ileum and the rectum                            were photographed. The quality of the bowel                            preparation was excellent. The colonoscopy was                            performed without difficulty. The patient tolerated                            the procedure well. The bowel preparation used was                            SUPREP via split dose instruction. Scope In: 2:28:22 PM Scope Out: 2:37:38 PM Scope Withdrawal Time: 0 hours 7 minutes 5 seconds  Total Procedure Duration: 0 hours 9 minutes 16 seconds  Findings:                 Diverticula were found in the sigmoid colon.                           Internal hemorrhoids were found during retroflexion.  There was evidence of prior right hemicolectomy.                            The surgical anastomosis revealed slight                            superficial ulceration. The new ileum was normal.                            The exam was otherwise without abnormality on                            direct and retroflexion views status post right                            hemicolectomy.. Complications:            No immediate complications. Estimated blood loss:                            None. Estimated Blood Loss:     Estimated blood loss: none. Impression:               - Diverticulosis in the sigmoid colon. Status post                            right hemicolectomy.                           - Internal hemorrhoids. Mild ulceration at the                            anastomosis. This may explain Hemoccult positive                             stool.                           - The examination was otherwise normal on direct                            and retroflexion views.                           - No specimens collected. Recommendation:           - Repeat colonoscopy in 5 years for surveillance.                           - Patient has a contact number available for                            emergencies. The signs and symptoms of potential                            delayed complications were discussed with the  patient. Return to normal activities tomorrow.                            Written discharge instructions were provided to the                            patient.                           - Resume previous diet.                           - Continue present medications.                           - Multivitamin with iron daily                           - EGD today. Please see report Docia Chuck. Henrene Pastor, MD 07/01/2021 2:45:18 PM This report has been signed electronically.

## 2021-07-01 NOTE — Progress Notes (Signed)
Sedate, gd SR, tolerated procedure well, VSS, report to RN 

## 2021-07-01 NOTE — Op Note (Signed)
[facility] Patient Name: Laurin Paulo Procedure Date: 07/01/2021 2:12 PM MRN: [id number] Endoscopist: Docia Chuck. Henrene Pastor , MD Age: 70 Referring MD:  Date of Birth: 12-03-1950 Gender: Female Account #: [id number] Procedure:                Upper GI endoscopy Indications:              Iron deficiency anemia, Heme positive stool Medicines:                Monitored Anesthesia Care Procedure:                Pre-Anesthesia Assessment:                           - Prior to the procedure, a History and Physical                            was performed, and patient medications and                            allergies were reviewed. The patient's tolerance of                            previous anesthesia was also reviewed. The risks                            and benefits of the procedure and the sedation                            options and risks were discussed with the patient.                            All questions were answered, and informed consent                            was obtained. Prior Anticoagulants: The patient has                            taken no previous anticoagulant or antiplatelet                            agents. ASA Grade Assessment: II - A patient with                            mild systemic disease. After reviewing the risks                            and benefits, the patient was deemed in                            satisfactory condition to undergo the procedure.                           After obtaining informed consent, the endoscope was  passed under direct vision. This was advanced to                            the third duodenum. Throughout the procedure, the                            patient's blood pressure, pulse, and oxygen                            saturations were monitored continuously. The GIF                            [id number] #[id number] was introduced through the mouth,                            and advanced to  the third part of duodenum. The                            upper GI endoscopy was accomplished without                            difficulty. The patient tolerated the procedure                            well. Scope In: Scope Out: Findings:                 The esophagus was normal.                           The stomach was normal.                           The examined duodenum was normal. Complications:            No immediate complications. Estimated Blood Loss:     Estimated blood loss: none. Impression:               - Normal esophagus.                           - Normal stomach.                           - Normal examined duodenum.                           - No specimens collected. Recommendation:           1. Multivitamin daily                           2. See colonoscopy report. Docia Chuck. Henrene Pastor, MD 07/01/2021 2:57:37 PM This report has been signed electronically.

## 2021-07-03 ENCOUNTER — Telehealth: Payer: Self-pay | Admitting: *Deleted

## 2021-07-03 NOTE — Telephone Encounter (Signed)
  Follow up Call-  Call back number 07/01/2021 05/19/2019 03/14/2019  Post procedure Call Back phone  # [phone]  Permission to leave phone message Yes Yes Yes  Some recent data might be hidden     Patient questions:  Message left to call us if necessary.

## 2021-07-03 NOTE — Telephone Encounter (Signed)
  Follow up Call-  Call back number 07/01/2021 05/19/2019 03/14/2019  Post procedure Call Back phone  # [phone]  Permission to leave phone message Yes Yes Yes  Some recent data might be hidden     Patient questions:  Do you have a fever, pain , or abdominal swelling? No. Pain Score  0 *  Have you tolerated food without any problems? Yes.    Have you been able to return to your normal activities? Yes.    Do you have any questions about your discharge instructions: Diet   No. Medications  No. Follow up visit  No.  Do you have questions or concerns about your Care? No.  Actions: * If pain score is 4 or above: No action needed, pain <4.  Have you developed a fever since your procedure? no  2.   Have you had an respiratory symptoms (SOB or cough) since your procedure? no  3.   Have you tested positive for COVID 19 since your procedure no  4.   Have you had any family members/close contacts diagnosed with the COVID 19 since your procedure?  no   If yes to any of these questions please route to Joylene John, RN and Joella Prince, RN   Pt states, "I have developed 2 new fever blisters- both upper lip, one on the outside of my lip and on the inside."  She states she has been putting Abreva on it.  She does get a fever blister break out "a few times a year."  I told her I would make note of this in her chart and to let us know if she has any issues with this.

## 2021-07-07 ENCOUNTER — Other Ambulatory Visit: Payer: Self-pay | Admitting: Family Medicine

## 2021-07-25 ENCOUNTER — Other Ambulatory Visit: Payer: Self-pay | Admitting: Cardiovascular Disease

## 2021-08-02 ENCOUNTER — Ambulatory Visit (INDEPENDENT_AMBULATORY_CARE_PROVIDER_SITE_OTHER): Payer: Medicare Other

## 2021-08-02 VITALS — Ht 61.0 in | Wt 136.0 lb

## 2021-08-02 DIAGNOSIS — Z0000 Encounter for general adult medical examination without abnormal findings: Secondary | ICD-10-CM

## 2021-08-02 NOTE — Patient Instructions (Signed)
Brenda Lara , Thank you for taking time to complete your Medicare Wellness Visit. I appreciate your ongoing commitment to your health goals. Please review the following plan we discussed and let me know if I can assist you in the future.   Screening recommendations/referrals: Colonoscopy: Completed 07/01/2021-Due 07/01/2026 Mammogram: Completed 04/29/2021-Due 04/29/2022 Bone Density: Completed 02/02/2020-Due 02/01/2022 Recommended yearly ophthalmology/optometry visit for glaucoma screening and checkup Recommended yearly dental visit for hygiene and checkup  Vaccinations: Influenza vaccine: Due-May obtain vaccine at our office or your local pharmacy. Pneumococcal vaccine: Up to date Tdap vaccine: Discuss with pharmacy Shingles vaccine: Discuss with pharmacy   Covid-19:Up to date  Advanced directives: Please bring a copy of Living Will and/or [place] for your chart once completed.   Conditions/risks identified: See problem list  Next appointment: Follow up in one year for your annual wellness visit 08/04/2022 @ 8:20   Preventive Care 70 Years and Older, Female Preventive care refers to lifestyle choices and visits with your health care provider that can promote health and wellness. What does preventive care include? A yearly physical exam. This is also called an annual well check. Dental exams once or twice a year. Routine eye exams. Ask your health care provider how often you should have your eyes checked. Personal lifestyle choices, including: Daily care of your teeth and gums. Regular physical activity. Eating a healthy diet. Avoiding tobacco and drug use. Limiting alcohol use. Practicing safe sex. Taking low-dose aspirin every day. Taking vitamin and mineral supplements as recommended by your health care provider. What happens during an annual well check? The services and screenings done by your health care provider during your annual well check will depend  on your age, overall health, lifestyle risk factors, and family history of disease. Counseling  Your health care provider may ask you questions about your: Alcohol use. Tobacco use. Drug use. Emotional well-being. Home and relationship well-being. Sexual activity. Eating habits. History of falls. Memory and ability to understand (cognition). Work and work Statistician. Reproductive health. Screening  You may have the following tests or measurements: Height, weight, and BMI. Blood pressure. Lipid and cholesterol levels. These may be checked every 5 years, or more frequently if you are over 80 years old. Skin check. Lung cancer screening. You may have this screening every year starting at age 70 if you have a 30-pack-year history of smoking and currently smoke or have quit within the past 15 years. Fecal occult blood test (FOBT) of the stool. You may have this test every year starting at age 70. Flexible sigmoidoscopy or colonoscopy. You may have a sigmoidoscopy every 5 years or a colonoscopy every 10 years starting at age 43. Hepatitis C blood test. Hepatitis B blood test. Sexually transmitted disease (STD) testing. Diabetes screening. This is done by checking your blood sugar (glucose) after you have not eaten for a while (fasting). You may have this done every 1-3 years. Bone density scan. This is done to screen for osteoporosis. You may have this done starting at age 70. Mammogram. This may be done every 1-2 years. Talk to your health care provider about how often you should have regular mammograms. Talk with your health care provider about your test results, treatment options, and if necessary, the need for more tests. Vaccines  Your health care provider may recommend certain vaccines, such as: Influenza vaccine. This is recommended every year. Tetanus, diphtheria, and acellular pertussis (Tdap, Td) vaccine. You may need a Td booster every 10 years. Zoster  vaccine. You may need  this after age 25. Pneumococcal 13-valent conjugate (PCV13) vaccine. One dose is recommended after age 70. Pneumococcal polysaccharide (PPSV23) vaccine. One dose is recommended after age 70. Talk to your health care provider about which screenings and vaccines you need and how often you need them. This information is not intended to replace advice given to you by your health care provider. Make sure you discuss any questions you have with your health care provider. Document Released: 09/28/2015 Document Revised: 05/21/2016 Document Reviewed: 07/03/2015 Elsevier Interactive Patient Education  2017 [employer] Prevention in the Home Falls can cause injuries. They can happen to people of all ages. There are many things you can do to make your home safe and to help prevent falls. What can I do on the outside of my home? Regularly fix the edges of walkways and driveways and fix any cracks. Remove anything that might make you trip as you walk through a door, such as a raised step or threshold. Trim any bushes or trees on the path to your home. Use bright outdoor lighting. Clear any walking paths of anything that might make someone trip, such as rocks or tools. Regularly check to see if handrails are loose or broken. Make sure that both sides of any steps have handrails. Any raised decks and porches should have guardrails on the edges. Have any leaves, snow, or ice cleared regularly. Use sand or salt on walking paths during winter. Clean up any spills in your garage right away. This includes oil or grease spills. What can I do in the bathroom? Use night lights. Install grab bars by the toilet and in the tub and shower. Do not use towel bars as grab bars. Use non-skid mats or decals in the tub or shower. If you need to sit down in the shower, use a plastic, non-slip stool. Keep the floor dry. Clean up any water that spills on the floor as soon as it happens. Remove soap buildup in the tub  or shower regularly. Attach bath mats securely with double-sided non-slip rug tape. Do not have throw rugs and other things on the floor that can make you trip. What can I do in the bedroom? Use night lights. Make sure that you have a light by your bed that is easy to reach. Do not use any sheets or blankets that are too big for your bed. They should not hang down onto the floor. Have a firm chair that has side arms. You can use this for support while you get dressed. Do not have throw rugs and other things on the floor that can make you trip. What can I do in the kitchen? Clean up any spills right away. Avoid walking on wet floors. Keep items that you use a lot in easy-to-reach places. If you need to reach something above you, use a strong step stool that has a grab bar. Keep electrical cords out of the way. Do not use floor polish or wax that makes floors slippery. If you must use wax, use non-skid floor wax. Do not have throw rugs and other things on the floor that can make you trip. What can I do with my stairs? Do not leave any items on the stairs. Make sure that there are handrails on both sides of the stairs and use them. Fix handrails that are broken or loose. Make sure that handrails are as long as the stairways. Check any carpeting to make sure that it  is firmly attached to the stairs. Fix any carpet that is loose or worn. Avoid having throw rugs at the top or bottom of the stairs. If you do have throw rugs, attach them to the floor with carpet tape. Make sure that you have a light switch at the top of the stairs and the bottom of the stairs. If you do not have them, ask someone to add them for you. What else can I do to help prevent falls? Wear shoes that: Do not have high heels. Have rubber bottoms. Are comfortable and fit you well. Are closed at the toe. Do not wear sandals. If you use a stepladder: Make sure that it is fully opened. Do not climb a closed stepladder. Make  sure that both sides of the stepladder are locked into place. Ask someone to hold it for you, if possible. Clearly mark and make sure that you can see: Any grab bars or handrails. First and last steps. Where the edge of each step is. Use tools that help you move around (mobility aids) if they are needed. These include: Canes. Walkers. Scooters. Crutches. Turn on the lights when you go into a dark area. Replace any light bulbs as soon as they burn out. Set up your furniture so you have a clear path. Avoid moving your furniture around. If any of your floors are uneven, fix them. If there are any pets around you, be aware of where they are. Review your medicines with your doctor. Some medicines can make you feel dizzy. This can increase your chance of falling. Ask your doctor what other things that you can do to help prevent falls. This information is not intended to replace advice given to you by your health care provider. Make sure you discuss any questions you have with your health care provider. Document Released: 06/28/2009 Document Revised: 02/07/2016 Document Reviewed: 10/06/2014 Elsevier Interactive Patient Education  2017 [employer].

## 2021-08-02 NOTE — Progress Notes (Signed)
Subjective:   Brenda Lara is a 70 y.o. female who presents for Medicare Annual (Subsequent) preventive examination.  I connected with Savonna today by telephone and verified that I am speaking with the correct person using two identifiers. Location patient: home Location provider: work Persons participating in the virtual visit: patient, Marine scientist.    I discussed the limitations, risks, security and privacy concerns of performing an evaluation and management service by telephone and the availability of in person appointments. I also discussed with the patient that there may be a patient responsible charge related to this service. The patient expressed understanding and verbally consented to this telephonic visit.    Interactive audio and video telecommunications were attempted between this provider and patient, however failed, due to patient having technical difficulties OR patient did not have access to video capability.  We continued and completed visit with audio only.  Some vital signs may be absent or patient reported.   Time Spent with patient on telephone encounter: 30 minutes   Review of Systems     Cardiac Risk Factors include: advanced age (>60men, >51 women);dyslipidemia;hypertension     Objective:    Today's Vitals   08/02/21 0902  Weight: 136 lb (61.7 kg)  Height: 5\' 1"  (1.549 m)   Body mass index is 25.7 kg/m.  Advanced Directives 08/02/2021 01/19/2020 01/17/2019 03/08/2014 01/12/2014  Does Patient Have a Medical Advance Directive? No No No Patient does not have advance directive Patient does not have advance directive  Does patient want to make changes to medical advance directive? - No - Patient declined - - -  Would patient like information on creating a medical advance directive? No - Patient declined - Yes (MAU/Ambulatory/Procedural Areas - Information given) - -  Pre-existing out of facility DNR order (yellow form or pink MOST form) - - - - No    Current  Medications (verified) Outpatient Encounter Medications as of 08/02/2021  Medication Sig   ASPIRIN LOW DOSE 81 MG EC tablet TAKE 1 TABLET (81 MG TOTAL) BY MOUTH DAILY. SWALLOW WHOLE.   atenolol (TENORMIN) 50 MG tablet TAKE 1 TABLET BY MOUTH TWICE A DAY   atorvastatin (LIPITOR) 40 MG tablet TAKE 1 TABLET BY MOUTH EVERY DAY   irbesartan (AVAPRO) 75 MG tablet TAKE 1 TABLET BY MOUTH EVERY DAY   Multiple Vitamin (MULTIVITAMIN) tablet Take 1 tablet by mouth daily.   nitroGLYCERIN (NITROSTAT) 0.4 MG SL tablet Place 1 tablet (0.4 mg total) under the tongue every 5 (five) minutes as needed for chest pain.   omeprazole (PRILOSEC) 40 MG capsule TAKE 1 CAPSULE (40 MG TOTAL) BY MOUTH DAILY.   topiramate (TOPAMAX) 100 MG tablet Take 100 mg by mouth daily.    famotidine (PEPCID) 20 MG tablet TAKE 0.5-2 TABLETS (10-40 MG TOTAL) BY MOUTH AT BEDTIME AS NEEDED FOR HEARTBURN OR INDIGESTION. (Patient not taking: Reported on 07/01/2021)   ferrous fumarate (HEMOCYTE - 106 MG FE) 325 (106 Fe) MG TABS tablet Take 1 tablet (106 mg of iron total) by mouth daily. (Patient not taking: Reported on 08/02/2021)   meclizine (ANTIVERT) 25 MG tablet Take 1 tablet (25 mg total) by mouth 3 (three) times daily as needed for dizziness. (Patient not taking: Reported on 07/01/2021)   [DISCONTINUED] hyoscyamine (LEVSIN SL) 0.125 MG SL tablet Place 1 tablet (0.125 mg total) under the tongue every 4 (four) hours as needed. (Patient not taking: Reported on 06/25/2021)   No facility-administered encounter medications on file as of 08/02/2021.  Allergies (verified) Patient has no known allergies.   History: Past Medical History:  Diagnosis Date   Allergy    Anemia    Angina pectoris    Anxiety    Cataract    Cervical cancer screening 01/31/2016   Menarche at 12 Regular and moderate flow No history of abnormal pap in past G1P1, s/p 1 SVD No history of abnormal MGM No concerns today gyn surgeries b/l tubal ligation and b/y  oophorectomy   Colon cancer ([place]) 2010   Coronary artery disease 2001/2005   S/P stenting to RCA and LAD   GERD (gastroesophageal reflux disease)    Hearing loss 08/05/2015   History of chicken pox    HTN (hypertension)    Menopause    Preventative health care 01/31/2016   Pure hypercholesterolemia    Vitamin D deficiency 06/01/2017   Past Surgical History:  Procedure Laterality Date   APPENDECTOMY  02/21/2009   BACK SURGERY  1978, 1988   multiple   BILATERAL OOPHORECTOMY  2010   CHOLECYSTECTOMY  11/09   COLON SURGERY  2010   right colectomy   COLONOSCOPY  01/26/2014   NORMAL    CORONARY STENT PLACEMENT  2001/2005   RCA   CYSTECTOMY     left ovary   Family History  Problem Relation Age of Onset   Colon polyps Mother    Hypertension Mother    Transient ischemic attack Mother    Heart attack Mother        age 82   Heart disease Mother    Leukemia Mother    Heart attack Father        72's   Heart disease Father    Alcohol abuse Father    Arthritis Father        mva   Prostate cancer Father    Thyroid disease Daughter    Coronary artery disease Other    Colon cancer Neg Hx    Pancreatic cancer Neg Hx    Rectal cancer Neg Hx    Stomach cancer Neg Hx    Esophageal cancer Neg Hx    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[employer], no dietary restrictions  Other Topics Concern   Not on file  Social History Narrative   Not on file   Social Determinants of Health   Financial Resource  Strain: Low Risk    Difficulty of Paying Living Expenses: Not hard at all  Food Insecurity: No Food Insecurity   Worried About Charity fundraiser in the Last Year: Never true   Arboriculturist in the Last Year: Never true  Transportation Needs: No Transportation Needs   Lack of Transportation (Medical): No   Lack of Transportation (Non-Medical): No  Physical Activity: Inactive   Days of Exercise per Week: 0 days   Minutes of Exercise per Session: 0 min  Stress: No Stress Concern Present  Feeling of Stress : Only a little  Social Connections: Engineer, building services of Communication with Friends and Family: More than three times a week   Frequency of Social Gatherings with Friends and Family: More than three times a week   Attends Religious Services: More than 4 times per year   Active Member of [employer] or Organizations: Yes   Attends Music therapist: More than 4 times per year   Marital Status: Married    Tobacco Counseling Counseling given: Not Answered   Clinical Intake:  Pre-visit preparation completed: Yes        BMI - recorded: 25.7 Nutritional Status: BMI 25 -29 Overweight Nutritional Risks: None Diabetes: No  How often do you need to have someone help you when you read instructions, pamphlets, or other written materials from your doctor or pharmacy?: 1 - Never  Diabetic?No  Interpreter Needed?: No  Information entered by :: Caroleen Hamman LPN   Activities of Daily Living In your present state of health, do you have any difficulty performing the following activities: 08/02/2021 04/01/2021  Hearing? N N  Vision? N N  Difficulty concentrating or making decisions? N N  Walking or climbing stairs? N N  Dressing or bathing? N N  Doing errands, shopping? N N  Preparing Food and eating ? N -  Using the Toilet? N -  In the past six months, have you accidently leaked urine? N -  Do you have problems with loss of bowel control? N -   Managing your Medications? N -  Managing your Finances? N -  Housekeeping or managing your Housekeeping? N -  Some recent data might be hidden    Patient Care Team: Mosie Lukes, MD as PCP - General (Family Medicine) Ladell Pier, MD as Consulting Physician (Oncology)  Indicate any recent Medical Services you may have received from other than [facility] providers in the past year (date may be approximate).     Assessment:   This is a routine wellness examination for Baljit.  Hearing/Vision screen Hearing Screening - Comments:: No issues Vision Screening - Comments:: Last eye exam-2022  Dietary issues and exercise activities discussed: Current Exercise Habits: The patient does not participate in regular exercise at present, Exercise limited by: None identified   Goals Addressed             This Visit's Progress    maintain healthy lifestyle   On track      Depression Screen PHQ 2/9 Scores 08/02/2021 04/01/2021 01/19/2020 01/17/2019 06/01/2017  PHQ - 2 Score 0 0 0 0 0    Fall Risk Fall Risk  08/02/2021 04/01/2021 01/19/2020 01/17/2019 06/01/2017  Falls in the past year? 0 0 0 0 No  Number falls in past yr: 0 0 0 - -  Injury with Fall? 0 0 0 - -  Risk for fall due to : - No Fall Risks - - -  Follow up Falls prevention discussed Falls evaluation completed - - -    FALL RISK PREVENTION PERTAINING TO THE HOME:  Any stairs in or around the home? No  Home free of loose throw rugs in walkways, pet beds, electrical cords, etc? Yes  Adequate lighting in your home to reduce risk of falls? Yes   ASSISTIVE DEVICES UTILIZED TO PREVENT FALLS:  Life alert? No  Use of a cane, walker or w/c? No  Grab bars in the bathroom? No  Shower chair or bench in shower? No  Elevated toilet  seat or a handicapped toilet? Yes   TIMED UP AND GO:  Was the test performed? No . Phone visit   Cognitive Function:Normal cognitive status assessed by this Nurse Health Advisor. No abnormalities found.           Immunizations Immunization History  Administered Date(s) Administered   Fluad Quad(high Dose 65+) 06/17/2019   Influenza Split 06/27/2011   Influenza, High Dose Seasonal PF 06/17/2018, 06/27/2020   Influenza-Unspecified 06/17/2017   Moderna Sars-Covid-2 Vaccination 10/29/2019, 11/26/2019   Pfizer Covid-19 Vaccine Bivalent Booster 24yrs & up 06/11/2021   Pneumococcal Conjugate-13 06/01/2017   Pneumococcal Polysaccharide-23 06/17/2019   Tdap 07/02/2011    TDAP status: Due, Education has been provided regarding the importance of this vaccine. Advised may receive this vaccine at local pharmacy or Health Dept. Aware to provide a copy of the vaccination record if obtained from local pharmacy or Health Dept. Verbalized acceptance and understanding.  Flu Vaccine status: Due, Education has been provided regarding the importance of this vaccine. Advised may receive this vaccine at local pharmacy or Health Dept. Aware to provide a copy of the vaccination record if obtained from local pharmacy or Health Dept. Verbalized acceptance and understanding.  Pneumococcal vaccine status: Up to date  Covid-19 vaccine status: Completed vaccines  Qualifies for Shingles Vaccine? Yes   Zostavax completed No   Shingrix Completed?: No.    Education has been provided regarding the importance of this vaccine. Patient has been advised to call insurance company to determine out of pocket expense if they have not yet received this vaccine. Advised may also receive vaccine at local pharmacy or Health Dept. Verbalized acceptance and understanding.  Screening Tests Health Maintenance  Topic Date Due   Zoster Vaccines- Shingrix (1 of 2) Never done   INFLUENZA VACCINE  04/15/2021   TETANUS/TDAP  07/01/2021   COVID-19 Vaccine (4 - Booster) 08/06/2021   MAMMOGRAM  04/29/2022   COLONOSCOPY (Pts 45-7yrs Insurance coverage will need to be confirmed)  07/01/2026   Pneumonia Vaccine 1+ Years old  Completed    DEXA SCAN  Completed   Hepatitis C Screening  Completed   HPV VACCINES  Aged Out    Health Maintenance  Health Maintenance Due  Topic Date Due   Zoster Vaccines- Shingrix (1 of 2) Never done   INFLUENZA VACCINE  04/15/2021   TETANUS/TDAP  07/01/2021   COVID-19 Vaccine (4 - Booster) 08/06/2021    Colorectal cancer screening: Type of screening: Colonoscopy. Completed 07/01/2021. Repeat every 5 years  Mammogram status: Completed bilateral 04/29/2021. Repeat every year  Bone Density status: Completed 02/02/2020. Results reflect: Bone density results: OSTEOPOROSIS. Repeat every 2 years.  Lung Cancer Screening: (Low Dose CT Chest recommended if Age 23-80 years, 30 pack-year currently smoking OR have quit w/in 15years.) does not qualify.     Additional Screening:  Hepatitis C Screening: Completed 04/01/2021  Vision Screening: Recommended annual ophthalmology exams for early detection of glaucoma and other disorders of the eye. Is the patient up to date with their annual eye exam?  Yes  Who is the provider or what is the name of the office in which the patient attends annual eye exams? Dr. Wynetta Emery   Dental Screening: Recommended annual dental exams for proper oral hygiene  Community Resource Referral / Chronic Care Management: CRR required this visit?  No   CCM required this visit?  No      Plan:     I have personally reviewed and noted the following in the patient's  chart:   Medical and social history Use of alcohol, tobacco or illicit drugs  Current medications and supplements including opioid prescriptions.  Functional ability and status Nutritional status Physical activity Advanced directives List of other physicians Hospitalizations, surgeries, and ER visits in previous 12 months Vitals Screenings to include cognitive, depression, and falls Referrals and appointments  In addition, I have reviewed and discussed with patient certain preventive protocols, quality  metrics, and best practice recommendations. A written personalized care plan for preventive services as well as general preventive health recommendations were provided to patient.   Due to this being a telephonic visit, the after visit summary with patients personalized plan was offered to patient via mail or my-chart.  Patient would like to access on my-chart.   Marta Antu, LPN   [id number]  Nurse Health Advisor  Nurse Notes: None

## 2021-09-05 ENCOUNTER — Other Ambulatory Visit: Payer: Self-pay | Admitting: Cardiovascular Disease

## 2021-09-10 ENCOUNTER — Encounter: Payer: Self-pay | Admitting: Family Medicine

## 2021-09-10 ENCOUNTER — Other Ambulatory Visit: Payer: Self-pay

## 2021-09-10 DIAGNOSIS — I10 Essential (primary) hypertension: Secondary | ICD-10-CM

## 2021-09-10 MED ORDER — AMLODIPINE BESYLATE 2.5 MG PO TABS: 2.5000 mg | ORAL_TABLET | Freq: Every day | ORAL | 1 refills | Status: DC

## 2021-09-10 NOTE — Telephone Encounter (Signed)
Pt aware, medication sent, pt scheduled

## 2021-09-24 ENCOUNTER — Ambulatory Visit (INDEPENDENT_AMBULATORY_CARE_PROVIDER_SITE_OTHER): Payer: Medicare Other

## 2021-09-24 DIAGNOSIS — I10 Essential (primary) hypertension: Secondary | ICD-10-CM

## 2021-09-24 NOTE — Progress Notes (Signed)
Pt here for Blood pressure check per Dr Charlett Blake  Pt currently takes: Amlodipine 2.5 mg nightly (stared 09/10/21), Atenolol 50 mg  -She says she has had some lightheadedness since starting the Amlodipine 2.5   Pt reports compliance with medication.  BP today @ = 170/ 80 HR = 64  Pt advised per Dr Charlett Blake:  No changes made in HTN medications d/t optimal home readings and complaints of lightheadedness. Reassured that the pt is staying hydrated with 60-80 oz of water. Pt does agree to have a VV visit between now and her 10/31/21 appointment to discuss her increased Anxiety.   Per Dr Charlett Blake

## 2021-10-02 ENCOUNTER — Other Ambulatory Visit: Payer: Self-pay | Admitting: Family Medicine

## 2021-10-02 DIAGNOSIS — I10 Essential (primary) hypertension: Secondary | ICD-10-CM

## 2021-10-04 ENCOUNTER — Telehealth (INDEPENDENT_AMBULATORY_CARE_PROVIDER_SITE_OTHER): Payer: Medicare Other | Admitting: Family Medicine

## 2021-10-04 ENCOUNTER — Encounter: Payer: Self-pay | Admitting: Family Medicine

## 2021-10-04 VITALS — BP 137/70 | HR 56

## 2021-10-04 DIAGNOSIS — D649 Anemia, unspecified: Secondary | ICD-10-CM | POA: Diagnosis not present

## 2021-10-04 DIAGNOSIS — I10 Essential (primary) hypertension: Secondary | ICD-10-CM | POA: Diagnosis not present

## 2021-10-04 DIAGNOSIS — E559 Vitamin D deficiency, unspecified: Secondary | ICD-10-CM

## 2021-10-04 DIAGNOSIS — R739 Hyperglycemia, unspecified: Secondary | ICD-10-CM | POA: Diagnosis not present

## 2021-10-04 DIAGNOSIS — F419 Anxiety disorder, unspecified: Secondary | ICD-10-CM

## 2021-10-04 MED ORDER — ALPRAZOLAM 0.25 MG PO TABS: 0.1250 mg | ORAL_TABLET | Freq: Two times a day (BID) | ORAL | 0 refills | Status: DC | PRN

## 2021-10-04 NOTE — Progress Notes (Signed)
Virtual telephone visit    Virtual Visit via Telephone Note   This visit type was conducted due to national recommendations for restrictions regarding the COVID-19 Pandemic (e.g. social distancing) in an effort to limit this patient's exposure and mitigate transmission in our community. Due to her co-morbid illnesses, this patient is at least at moderate risk for complications without adequate follow up. This format is felt to be most appropriate for this patient at this time. The patient did not have access to video technology or had technical difficulties with video requiring transitioning to audio format only (telephone). Physical exam was limited to content and character of the telephone converstion. S Chism, CMA was able to get the patient set up on a telephone visit.   Patient location: home Patient and provider in visit Provider location: Office  I discussed the limitations of evaluation and management by telemedicine and the availability of in person appointments. The patient expressed understanding and agreed to proceed.   Visit Date: 10/04/2021  Today's healthcare provider: Penni Homans, MD     Subjective:    Patient ID: Brenda Lara, female    DOB: [birth year], 71 y.o.   MRN: [id number]  Chief Complaint  Patient presents with   Anxiety    HPI Patient is in today for evaluation of anxiety and hypertension. She notes a long history of intermittent anxiety dating back to her 71s.  She notes needing to be treated in her 71s and again in 2012.  She had been doing well until December 2022 when she turned to 71 and they redid their wills.  She notes she feels that triggered her and she has been feeling more anxious shaky and having trouble sleeping since then.  No other triggers are obvious.  She is used alprazolam in the past with good results.  She has 1 to a couple episodes a day where she feels the symptoms and they last 15 to 20 minutes. No recent febrile illness or  hospitalizations. She feels like the Amlodipine is contributing to her anxiety. She is not taking her Irbesartan. Denies CP/SOB/HA/congestion/fevers/GI or GU c/o. Taking meds as prescribed   Past Medical History:  Diagnosis Date   Allergy    Anemia    Angina pectoris    Anxiety    Cataract    Cervical cancer screening 01/31/2016   Menarche at 12 Regular and moderate flow No history of abnormal pap in past G1P1, s/p 1 SVD No history of abnormal MGM No concerns today gyn surgeries b/l tubal ligation and b/y oophorectomy   Colon cancer ([place]) 2010   Coronary artery disease 2001/2005   S/P stenting to RCA and LAD   GERD (gastroesophageal reflux disease)    Hearing loss 08/05/2015   History of chicken pox    HTN (hypertension)    Menopause    Preventative health care 01/31/2016   Pure hypercholesterolemia    Vitamin D deficiency 06/01/2017    Past Surgical History:  Procedure Laterality Date   APPENDECTOMY  02/21/2009   BACK SURGERY  1978, 1988   multiple   BILATERAL OOPHORECTOMY  2010   CHOLECYSTECTOMY  11/09   COLON SURGERY  2010   right colectomy   COLONOSCOPY  01/26/2014   NORMAL    CORONARY STENT PLACEMENT  2001/2005   RCA   CYSTECTOMY     left ovary    Family History  Problem Relation Age of Onset   Colon polyps Mother    Hypertension Mother  Transient ischemic attack Mother    Heart attack Mother        age 54   Heart disease Mother    Leukemia Mother    Heart attack Father        10's   Heart disease Father    Alcohol abuse Father    Arthritis Father        mva   Prostate cancer Father    Thyroid disease Daughter    Coronary artery disease Other    Colon cancer Neg Hx    Pancreatic cancer Neg Hx    Rectal cancer Neg Hx    Stomach cancer Neg Hx    Esophageal cancer Neg Hx     Social History   Socioeconomic History   Marital status: Married    Spouse name: Not on file   Number of children: Not on file   Years of education: Not on file   Highest  education level: Not on file  Occupational History   Not on file  Tobacco Use   Smoking status: Never   Smokeless tobacco: Never  Vaping Use   Vaping Use: Never used  Substance and Sexual Activity   Alcohol use: No   Drug use: No   Sexual activity: Yes    Birth control/protection: Post-menopausal    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, works Producer, 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[employer], no dietary restrictions  Other Topics Concern   Not on file  Social History Narrative   Not on file   Social Determinants of Health   Financial Resource Strain: Low Risk    Difficulty of Paying Living Expenses: Not hard at all  Food Insecurity: No Food Insecurity   Worried About Charity fundraiser in the Last Year: Never true   Arboriculturist in the Last Year: Never true  Transportation Needs: No Transportation Needs   Lack of Transportation (Medical): No   Lack of Transportation (Non-Medical): No  Physical Activity: Inactive   Days of Exercise per Week: 0 days   Minutes of Exercise per Session: 0 min  Stress: No Stress Concern Present   Feeling of Stress : Only a little  Social Connections: Engineer, building services of Communication with Friends and Family: More than three times a week   Frequency of Social Gatherings with Friends and Family: More than three times a week   Attends Religious Services: More than 4 times per year   Active Member of [employer] or Organizations: Yes   Attends Music therapist: More than 4 times per year   Marital Status: Married  Human resources officer Violence: Not At Risk   Fear of Current or Ex-Partner: No   Emotionally Abused: No   Physically Abused: No   Sexually Abused: No    Outpatient Medications Prior to Visit  Medication Sig Dispense Refill   ASPIRIN LOW DOSE 81 MG EC tablet TAKE 1 TABLET (81 MG TOTAL) BY MOUTH DAILY. SWALLOW WHOLE. 90 tablet 0   atenolol (TENORMIN) 50 MG tablet TAKE 1 TABLET BY MOUTH TWICE A DAY 180 tablet 1   atorvastatin (LIPITOR) 40 MG tablet TAKE 1  TABLET BY MOUTH EVERY DAY 90 tablet 3   famotidine (PEPCID) 20 MG tablet TAKE 0.5-2 TABLETS (10-40 MG TOTAL) BY MOUTH AT BEDTIME AS NEEDED FOR HEARTBURN OR INDIGESTION. 180 tablet 1   ferrous fumarate (HEMOCYTE - 106 MG FE) 325 (106 Fe) MG TABS tablet Take 1 tablet (106 mg of iron total) by mouth daily. 30 tablet 2  meclizine (ANTIVERT) 25 MG tablet Take 1 tablet (25 mg total) by mouth 3 (three) times daily as needed for dizziness. 90 tablet 0   Multiple Vitamin (MULTIVITAMIN) tablet Take 1 tablet by mouth daily.     nitroGLYCERIN (NITROSTAT) 0.4 MG SL tablet Place 1 tablet (0.4 mg total) under the tongue every 5 (five) minutes as needed for chest pain. 25 tablet 3   omeprazole (PRILOSEC) 40 MG capsule TAKE 1 CAPSULE (40 MG TOTAL) BY MOUTH DAILY. 90 capsule 1   topiramate (TOPAMAX) 100 MG tablet Take 100 mg by mouth daily.      amLODipine (NORVASC) 2.5 MG tablet TAKE 1 TABLET BY MOUTH EVERY DAY 90 tablet 0   irbesartan (AVAPRO) 75 MG tablet TAKE 1 TABLET BY MOUTH EVERY DAY 90 tablet 1   No facility-administered medications prior to visit.    No Known Allergies  Review of Systems  Constitutional:  Positive for malaise/fatigue. Negative for fever.  HENT:  Negative for congestion.   Eyes:  Negative for blurred vision.  Respiratory:  Negative for shortness of breath.   Cardiovascular:  Negative for chest pain, palpitations and leg swelling.  Gastrointestinal:  Negative for abdominal pain, blood in stool and nausea.  Genitourinary:  Negative for dysuria and frequency.  Musculoskeletal:  Negative for falls.  Skin:  Negative for rash.  Neurological:  Positive for tremors. Negative for dizziness, loss of consciousness and headaches.  Endo/Heme/Allergies:  Negative for environmental allergies.  Psychiatric/Behavioral:  Negative for depression. The patient is nervous/anxious and has insomnia.       Objective:    Physical Exam unable to obtain via phone  BP 137/70    Pulse (!) 56  Wt  Readings from Last 3 Encounters:  08/02/21 136 lb (61.7 kg)  07/01/21 139 lb (63 kg)  06/25/21 139 lb (63 kg)    Diabetic Foot Exam - Simple   No data filed    Lab Results  Component Value Date   WBC 6.5 05/10/2021   HGB 12.2 05/10/2021   HCT 37.7 05/10/2021   PLT 265.0 05/10/2021   GLUCOSE 92 04/01/2021   CHOL 128 04/01/2021   TRIG 110.0 04/01/2021   HDL 60.00 04/01/2021   LDLCALC 46 04/01/2021   ALT 21 06/25/2021   AST 29 06/25/2021   NA 136 04/01/2021   K 3.8 04/01/2021   CL 103 04/01/2021   CREATININE 0.70 04/01/2021   BUN 21 04/01/2021   CO2 25 04/01/2021   TSH 1.58 04/01/2021   INR 0.95 05/13/2011   HGBA1C 6.0 04/01/2021    Lab Results  Component Value Date   TSH 1.58 04/01/2021   Lab Results  Component Value Date   WBC 6.5 05/10/2021   HGB 12.2 05/10/2021   HCT 37.7 05/10/2021   MCV 85.6 05/10/2021   PLT 265.0 05/10/2021   Lab Results  Component Value Date   NA 136 04/01/2021   K 3.8 04/01/2021   CO2 25 04/01/2021   GLUCOSE 92 04/01/2021   BUN 21 04/01/2021   CREATININE 0.70 04/01/2021   BILITOT 0.5 06/25/2021   ALKPHOS 113 06/25/2021   AST 29 06/25/2021   ALT 21 06/25/2021   PROT 6.7 06/25/2021   ALBUMIN 4.6 06/25/2021   CALCIUM 9.3 04/01/2021   GFR 88.13 04/01/2021   Lab Results  Component Value Date   CHOL 128 04/01/2021   Lab Results  Component Value Date   HDL 60.00 04/01/2021   Lab Results  Component Value Date   LDLCALC 46  04/01/2021   Lab Results  Component Value Date   TRIG 110.0 04/01/2021   Lab Results  Component Value Date   CHOLHDL 2 04/01/2021   Lab Results  Component Value Date   HGBA1C 6.0 04/01/2021       Assessment & Plan:   Problem List Items Addressed This Visit     Essential hypertension    She feels the amlodipine 2.5 mg has contributed to her anxiety and her blood pressures have been running between 10 8-1 69 systolic over 51 to 70J diastolic.  Most numbers in the 10 8-1 30 range.  We will stop  amlodipine for now.  Currently she is taking atenolol 50 mg twice daily and is not taking her irbesartan.  We will hold off on restarting that till we see her numbers moving forward.      Anxiety    She notes a long history of intermittent anxiety dating back to her 60s.  She notes needing to be treated in her 72s and again in 2012.  She had been doing well until December 2022 when she turned to 67 and they redid their wills.  She notes she feels that triggered her and she has been feeling more anxious shaky and having trouble sleeping since then.  No other triggers are obvious.  She is used alprazolam in the past with good results.  She has 1 to a couple episodes a day where she feels the symptoms and they last 15 to 20 minutes.  We will start with alprazolam as needed but if she needs it frequently or it is not helpful we will change medications when we meet next month or sooner as needed.  She is advised not to drive on it and that it may affect her differently than it did in her 7s and to consider an SSRI or BuSpar if symptoms persist, discussed current start and plan of care then documented for 30 minutes      Relevant Medications   ALPRAZolam (XANAX) 0.25 MG tablet    I have discontinued Hassan Rowan A. Dieu's irbesartan and amLODipine. I am also having her start on ALPRAZolam. Additionally, I am having her maintain her topiramate, meclizine, multivitamin, famotidine, ferrous fumarate, atenolol, nitroGLYCERIN, omeprazole, atorvastatin, and Aspirin Low Dose.  Meds ordered this encounter  Medications   ALPRAZolam (XANAX) 0.25 MG tablet    Sig: Take 0.5-1 tablets (0.125-0.25 mg total) by mouth 2 (two) times daily as needed for anxiety.    Dispense:  40 tablet    Refill:  0     I discussed the assessment and treatment plan with the patient. The patient was provided an opportunity to ask questions and all were answered. The patient agreed with the plan and demonstrated an understanding of the  instructions.   The patient was advised to call back or seek an in-person evaluation if the symptoms worsen or if the condition fails to improve as anticipated.  I provided 30 minutes of non-face-to-face time during this encounter.   Penni Homans, MD [facility] at [facility] [phone] (phone) [phone] (fax)  [place]

## 2021-10-04 NOTE — Assessment & Plan Note (Addendum)
She notes a long history of intermittent anxiety dating back to her 56s.  She notes needing to be treated in her 85s and again in 2012.  She had been doing well until December 2022 when she turned to 63 and they redid their wills.  She notes she feels that triggered her and she has been feeling more anxious shaky and having trouble sleeping since then.  No other triggers are obvious.  She is used alprazolam in the past with good results.  She has 1 to a couple episodes a day where she feels the symptoms and they last 15 to 20 minutes.  We will start with alprazolam as needed but if she needs it frequently or it is not helpful we will change medications when we meet next month or sooner as needed.  She is advised not to drive on it and that it may affect her differently than it did in her 10s and to consider an SSRI or BuSpar if symptoms persist, discussed current start and plan of care then documented for 30 minutes. Will arrange for labwork soon to evaluate thyroid etc.

## 2021-10-04 NOTE — Assessment & Plan Note (Signed)
She feels the amlodipine 2.5 mg has contributed to her anxiety and her blood pressures have been running between 10 8-1 69 systolic over 51 to 03J diastolic.  Most numbers in the 10 8-1 30 range.  We will stop amlodipine for now.  Currently she is taking atenolol 50 mg twice daily and is not taking her irbesartan.  We will hold off on restarting that till we see her numbers moving forward.

## 2021-10-16 ENCOUNTER — Other Ambulatory Visit (INDEPENDENT_AMBULATORY_CARE_PROVIDER_SITE_OTHER): Payer: Medicare Other

## 2021-10-16 DIAGNOSIS — I10 Essential (primary) hypertension: Secondary | ICD-10-CM

## 2021-10-16 DIAGNOSIS — D649 Anemia, unspecified: Secondary | ICD-10-CM | POA: Diagnosis not present

## 2021-10-16 DIAGNOSIS — R739 Hyperglycemia, unspecified: Secondary | ICD-10-CM | POA: Diagnosis not present

## 2021-10-16 DIAGNOSIS — E559 Vitamin D deficiency, unspecified: Secondary | ICD-10-CM | POA: Diagnosis not present

## 2021-10-16 LAB — CBC WITH DIFFERENTIAL/PLATELET
Basophils Absolute: 0 10*3/uL (ref 0.0–0.1)
Basophils Relative: 0.5 % (ref 0.0–3.0)
Eosinophils Absolute: 0.1 10*3/uL (ref 0.0–0.7)
Eosinophils Relative: 1.2 % (ref 0.0–5.0)
HCT: 37.4 % (ref 36.0–46.0)
Hemoglobin: 12.3 g/dL (ref 12.0–15.0)
Lymphocytes Relative: 23.9 % (ref 12.0–46.0)
Lymphs Abs: 1.2 10*3/uL (ref 0.7–4.0)
MCHC: 33 g/dL (ref 30.0–36.0)
MCV: 86.7 fl (ref 78.0–100.0)
Monocytes Absolute: 0.4 10*3/uL (ref 0.1–1.0)
Monocytes Relative: 8.1 % (ref 3.0–12.0)
Neutro Abs: 3.3 10*3/uL (ref 1.4–7.7)
Neutrophils Relative %: 66.3 % (ref 43.0–77.0)
Platelets: 241 10*3/uL (ref 150.0–400.0)
RBC: 4.31 Mil/uL (ref 3.87–5.11)
RDW: 13.5 % (ref 11.5–15.5)
WBC: 4.9 10*3/uL (ref 4.0–10.5)

## 2021-10-16 LAB — COMPREHENSIVE METABOLIC PANEL
ALT: 17 U/L (ref 0–35)
AST: 21 U/L (ref 0–37)
Albumin: 4.3 g/dL (ref 3.5–5.2)
Alkaline Phosphatase: 94 U/L (ref 39–117)
BUN: 16 mg/dL (ref 6–23)
CO2: 30 mEq/L (ref 19–32)
Calcium: 9.5 mg/dL (ref 8.4–10.5)
Chloride: 103 mEq/L (ref 96–112)
Creatinine, Ser: 0.73 mg/dL (ref 0.40–1.20)
GFR: 83.48 mL/min (ref >60.00)
Glucose, Bld: 87 mg/dL (ref 70–99)
Potassium: 4.2 mEq/L (ref 3.5–5.1)
Sodium: 141 mEq/L (ref 135–145)
Total Bilirubin: 0.6 mg/dL (ref 0.2–1.2)
Total Protein: 6.7 g/dL (ref 6.0–8.3)

## 2021-10-16 LAB — LIPID PANEL
Cholesterol: 131 mg/dL (ref 0–200)
HDL: 55.4 mg/dL (ref >39.00)
LDL Cholesterol: 52 mg/dL (ref 0–99)
NonHDL: 76.06
Total CHOL/HDL Ratio: 2
Triglycerides: 120 mg/dL (ref 0.0–149.0)
VLDL: 24 mg/dL (ref 0.0–40.0)

## 2021-10-16 LAB — TSH: TSH: 3.17 u[IU]/mL (ref 0.35–5.50)

## 2021-10-16 LAB — HEMOGLOBIN A1C: Hgb A1c MFr Bld: 5.6 % (ref 4.6–6.5)

## 2021-10-19 LAB — VITAMIN D 1,25 DIHYDROXY
Vitamin D 1, 25 (OH)2 Total: 25 pg/mL (ref 18–72)
Vitamin D2 1, 25 (OH)2: <8 pg/mL
Vitamin D3 1, 25 (OH)2: 25 pg/mL

## 2021-10-31 ENCOUNTER — Ambulatory Visit: Payer: Medicare Other | Admitting: Family Medicine

## 2021-11-04 ENCOUNTER — Ambulatory Visit (INDEPENDENT_AMBULATORY_CARE_PROVIDER_SITE_OTHER): Payer: Medicare Other | Admitting: Medical

## 2021-11-04 VITALS — BP 114/62 | HR 60 | Temp 98.2°F | Resp 18 | Ht 61.0 in | Wt 137.0 lb

## 2021-11-04 DIAGNOSIS — R739 Hyperglycemia, unspecified: Secondary | ICD-10-CM

## 2021-11-04 DIAGNOSIS — E7800 Pure hypercholesterolemia, unspecified: Secondary | ICD-10-CM | POA: Diagnosis not present

## 2021-11-04 DIAGNOSIS — I10 Essential (primary) hypertension: Secondary | ICD-10-CM

## 2021-11-04 DIAGNOSIS — F419 Anxiety disorder, unspecified: Secondary | ICD-10-CM | POA: Diagnosis not present

## 2021-11-04 DIAGNOSIS — Z79899 Other long term (current) drug therapy: Secondary | ICD-10-CM | POA: Diagnosis not present

## 2021-11-04 NOTE — Patient Instructions (Addendum)
Htn- bp well controlled at your home. Continue atenolol daily. Check bp every other day at home to confirm bp controlled. Also can do nurse visit in one week  and bring your machine to verify giving correct readings.  Elevated blood sugar in the past but recent A1c shows you are not in the prediabetic range.  Do recommend continue with low sugar diet.  Hyperlipidemia-recent cholesterol level early February well-controlled.  Continue current cholesterol medication.  Anxiety recently.  Urine drug screen given today.  We will also have the sign controlled medication contract in the event that you need refill of xanax prescribed recently.  Follow-up in 6 months or sooner if needed.

## 2021-11-04 NOTE — Progress Notes (Signed)
Subjective:    Patient ID: Brenda Lara, female    DOB: 05/05/1951, 71 y.o.   MRN: [id number]  HPI  Pt in for 6 month follow up.   High cholesterol- on atorvastatin. Early in month. Your lipid panel shows normal cholsterol levels.    Pt sugar has been elevated in the past. A1c was 5.6.  Gerd-controlled with omeprazole.  Vit d on lower end normal. On vit D otc.  Recent metabolic panel shows good kidney function and normal liver enzymes.    Pt has anxiety. She got xanax recently. Hx of anxiety in 2012. UDS done today. 2012 only got one prescription and never took all tabs. Anxiety decreased and just recently returned around [place]. Pt not sure if will need xanax chronically.   Last week bp over past week all 120-130/70. Last reading 114/62(AT HOME)   Today I got 140/80  Review of Systems  Constitutional:  Negative for chills, fatigue and fever.  HENT:  Negative for congestion and drooling.   Respiratory:  Negative for cough, chest tightness, shortness of breath and wheezing.   Cardiovascular:  Negative for chest pain and palpitations.  Gastrointestinal:  Negative for abdominal pain.  Musculoskeletal:  Negative for back pain.  Hematological:  Negative for adenopathy. Does not bruise/bleed easily.  Psychiatric/Behavioral:  Negative for behavioral problems and confusion. The patient is nervous/anxious.     Past Medical History:  Diagnosis Date   Allergy    Anemia    Angina pectoris    Anxiety    Cataract    Cervical cancer screening 01/31/2016   Menarche at 12 Regular and moderate flow No history of abnormal pap in past G1P1, s/p 1 SVD No history of abnormal MGM No concerns today gyn surgeries b/l tubal ligation and b/y oophorectomy   Colon cancer ([place]) 2010   Coronary artery disease 2001/2005   S/P stenting to RCA and LAD   GERD (gastroesophageal reflux disease)    Hearing loss 08/05/2015   History of chicken pox    HTN (hypertension)    Menopause     Preventative health care 01/31/2016   Pure hypercholesterolemia    Vitamin D deficiency 06/01/2017     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[employer], no dietary restrictions  Other Topics Concern   Not on file  Social History Narrative   Not on file   Social Determinants of Health   Financial Resource Strain: Low Risk    Difficulty of Paying Living Expenses: Not hard at all  Food Insecurity: No Food Insecurity   Worried About Charity fundraiser in the Last Year: Never true   Arboriculturist in the Last Year: Never true  Transportation Needs: No Transportation Needs   Lack of Transportation (Medical): No   Lack of Transportation (Non-Medical): No  Physical Activity: Inactive   Days of Exercise per Week: 0 days  Minutes of Exercise per Session: 0 min  Stress: No Stress Concern Present   Feeling of Stress : Only a little  Social Connections: Engineer, building services of Communication with Friends and Family: More than three times a week   Frequency of Social Gatherings with Friends and Family: More than three times a week   Attends Religious Services: More than 4 times per year   Active Member of Clubs or Organizations: Yes   Attends Music therapist: More than 4 times per year   Marital Status: Married  Human resources officer Violence: Not At Risk   Fear of Current or Ex-Partner: No   Emotionally Abused: No   Physically Abused: No   Sexually Abused: No    Past Surgical History:   Procedure Laterality Date   APPENDECTOMY  02/21/2009   BACK SURGERY  1978, 1988   multiple   BILATERAL OOPHORECTOMY  2010   CHOLECYSTECTOMY  11/09   COLON SURGERY  2010   right colectomy   COLONOSCOPY  01/26/2014   NORMAL    CORONARY STENT PLACEMENT  2001/2005   RCA   CYSTECTOMY     left ovary    Family History  Problem Relation Age of Onset   Colon polyps Mother    Hypertension Mother    Transient ischemic attack Mother    Heart attack Mother        age 44   Heart disease Mother    Leukemia Mother    Heart attack Father        50's   Heart disease Father    Alcohol abuse Father    Arthritis Father        mva   Prostate cancer Father    Thyroid disease Daughter    Coronary artery disease Other    Colon cancer Neg Hx    Pancreatic cancer Neg Hx    Rectal cancer Neg Hx    Stomach cancer Neg Hx    Esophageal cancer Neg Hx     No Known Allergies  Current Outpatient Medications on File Prior to Visit  Medication Sig Dispense Refill   ALPRAZolam (XANAX) 0.25 MG tablet Take 0.5-1 tablets (0.125-0.25 mg total) by mouth 2 (two) times daily as needed for anxiety. 40 tablet 0   ASPIRIN LOW DOSE 81 MG EC tablet TAKE 1 TABLET (81 MG TOTAL) BY MOUTH DAILY. SWALLOW WHOLE. 90 tablet 0   atenolol (TENORMIN) 50 MG tablet TAKE 1 TABLET BY MOUTH TWICE A DAY 180 tablet 1   atorvastatin (LIPITOR) 40 MG tablet TAKE 1 TABLET BY MOUTH EVERY DAY 90 tablet 3   famotidine (PEPCID) 20 MG tablet TAKE 0.5-2 TABLETS (10-40 MG TOTAL) BY MOUTH AT BEDTIME AS NEEDED FOR HEARTBURN OR INDIGESTION. 180 tablet 1   ferrous fumarate (HEMOCYTE - 106 MG FE) 325 (106 Fe) MG TABS tablet Take 1 tablet (106 mg of iron total) by mouth daily. 30 tablet 2   meclizine (ANTIVERT) 25 MG tablet Take 1 tablet (25 mg total) by mouth 3 (three) times daily as needed for dizziness. 90 tablet 0   Multiple Vitamin (MULTIVITAMIN) tablet Take 1 tablet by mouth daily.     nitroGLYCERIN (NITROSTAT) 0.4 MG SL tablet Place 1  tablet (0.4 mg total) under the tongue every 5 (five) minutes as needed for chest pain. 25 tablet 3   omeprazole (PRILOSEC) 40 MG capsule TAKE 1 CAPSULE (40 MG TOTAL) BY MOUTH DAILY. 90 capsule 1   topiramate (TOPAMAX) 100 MG  tablet Take 100 mg by mouth daily.      No current facility-administered medications on file prior to visit.    BP (!) 160/66    Pulse 60    Temp 98.2 F (36.8 C)    Resp 18    Ht 5\' 1"  (1.549 m)    Wt 137 lb (62.1 kg)    SpO2 99%    BMI 25.89 kg/m       Objective:   Physical Exam  General Mental Status- Alert. General Appearance- Not in acute distress.   Skin General: Color- Normal Color. Moisture- Normal Moisture.  Neck Carotid Arteries- Normal color. Moisture- Normal Moisture. No carotid bruits. No JVD.  Chest and Lung Exam Auscultation: Breath Sounds:-Normal.  Cardiovascular Auscultation:Rythm- Regular. Murmurs & Other Heart Sounds:Auscultation of the heart reveals- No Murmurs.  Abdomen Inspection:-Inspeection Normal. Palpation/Percussion:Note:No mass. Palpation and Percussion of the abdomen reveal- Non Tender, Non Distended + BS, no rebound or guarding.   Neurologic Cranial Nerve exam:- CN III-XII intact(No nystagmus), symmetric smile. Strength:- 5/5 equal and symmetric strength both upper and lower extremities.       Assessment & Plan:   Patient Instructions  Htn- bp well controlled at your home. Continue atenolol daily. Check bp every other day at home to confirm bp controlled. Also can do nurse visit in one week  and bring your machine to verify giving correct readings.  Elevated blood sugar in the past but recent A1c shows you are not in the prediabetic range.  Do recommend continue with low sugar diet.  Hyperlipidemia-recent cholesterol level early February well-controlled.  Continue current cholesterol medication.  Anxiety recently.  Urine drug screen given today.  We will also have the sign controlled medication contract in the  event that you need refill of xanax prescribed recently.  Follow-up in 6 months or sooner if needed.   Mackie Pai, PA-C

## 2021-11-06 LAB — DRUG MONITORING PANEL 376104, URINE
Amphetamines: NEGATIVE ng/mL (ref <500)
Barbiturates: NEGATIVE ng/mL (ref <300)
Benzodiazepines: NEGATIVE ng/mL (ref <100)
Cocaine Metabolite: NEGATIVE ng/mL (ref <150)
Desmethyltramadol: NEGATIVE ng/mL (ref <100)
Opiates: NEGATIVE ng/mL (ref <100)
Oxycodone: NEGATIVE ng/mL (ref <100)
Tramadol: NEGATIVE ng/mL (ref <100)

## 2021-11-06 LAB — DM TEMPLATE

## 2021-11-12 ENCOUNTER — Ambulatory Visit (INDEPENDENT_AMBULATORY_CARE_PROVIDER_SITE_OTHER): Payer: Medicare Other | Admitting: Family Medicine

## 2021-11-12 VITALS — BP 162/82 | HR 59

## 2021-11-12 DIAGNOSIS — I10 Essential (primary) hypertension: Secondary | ICD-10-CM

## 2021-11-12 NOTE — Progress Notes (Signed)
Pt here for Blood pressure check per Mosie Lukes, MD  Pt currently takes: atenolol 50 mg twice daily Pt reports compliance with medication.   BP Readings from Last 3 Encounters:  11/04/21 114/62  10/04/21 137/70  07/01/21 133/75     BP today:  160/90 L arm ,  162/82 after sitting for 5 minutes   HR: 59  Pt brought at home readings  109/60, 116/60  Compared pt's home BP machine, consistent with office readings today.  Pt advised on DASH diet, continue current medications and follow up in about 3 months. Appointment made for 03/09/22.

## 2021-11-22 DIAGNOSIS — G43719 Chronic migraine without aura, intractable, without status migrainosus: Secondary | ICD-10-CM | POA: Diagnosis not present

## 2021-11-22 DIAGNOSIS — R519 Headache, unspecified: Secondary | ICD-10-CM | POA: Diagnosis not present

## 2021-11-22 DIAGNOSIS — G43019 Migraine without aura, intractable, without status migrainosus: Secondary | ICD-10-CM | POA: Diagnosis not present

## 2021-11-25 DIAGNOSIS — H11153 Pinguecula, bilateral: Secondary | ICD-10-CM | POA: Diagnosis not present

## 2021-11-25 DIAGNOSIS — D3131 Benign neoplasm of right choroid: Secondary | ICD-10-CM | POA: Diagnosis not present

## 2021-12-04 ENCOUNTER — Other Ambulatory Visit: Payer: Self-pay | Admitting: Cardiovascular Disease

## 2021-12-09 ENCOUNTER — Other Ambulatory Visit: Payer: Self-pay | Admitting: Family Medicine

## 2021-12-27 ENCOUNTER — Other Ambulatory Visit: Payer: Self-pay | Admitting: Family Medicine

## 2022-01-15 IMAGING — MG MM DIGITAL SCREENING BILAT W/ TOMO AND CAD
8 series · 9 of 24 positions shown · non-contrast
Comparison: Previous exam(s).

CLINICAL DATA: Screening.

EXAM:
DIGITAL SCREENING BILATERAL MAMMOGRAM WITH TOMOSYNTHESIS AND CAD
TECHNIQUE: Bilateral screening digital craniocaudal and mediolateral oblique
mammograms were obtained. Bilateral screening digital breast
tomosynthesis was performed. The images were evaluated with
computer-aided detection.

[L MLO synth-2D]
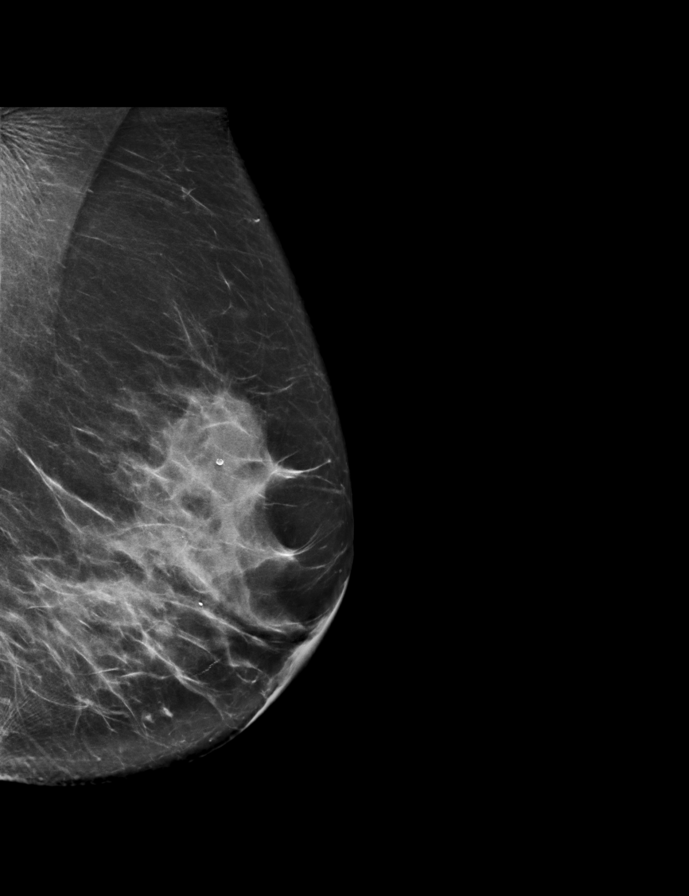

[L CC synth-2D]
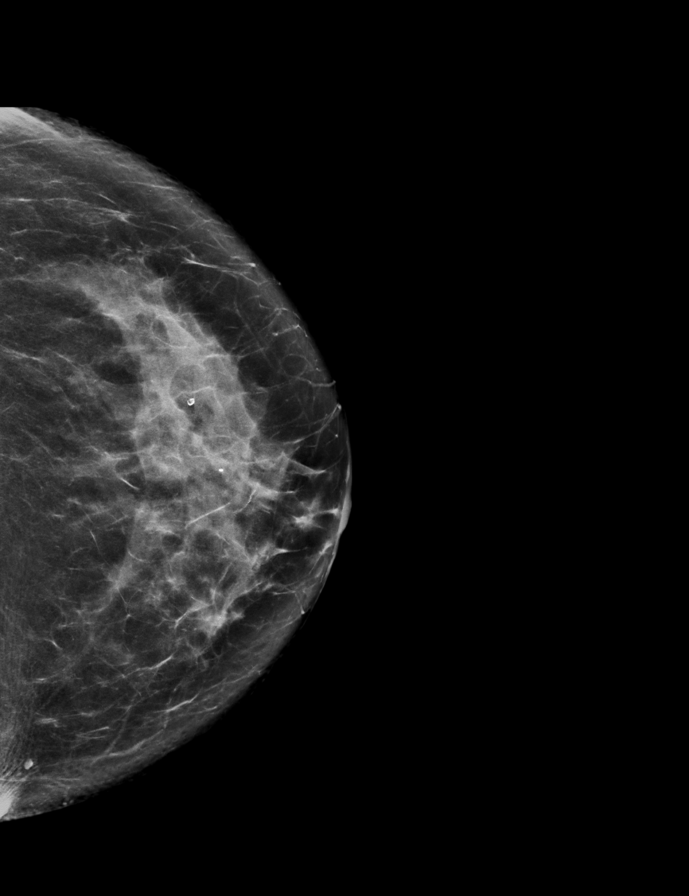

[R CC synth-2D]
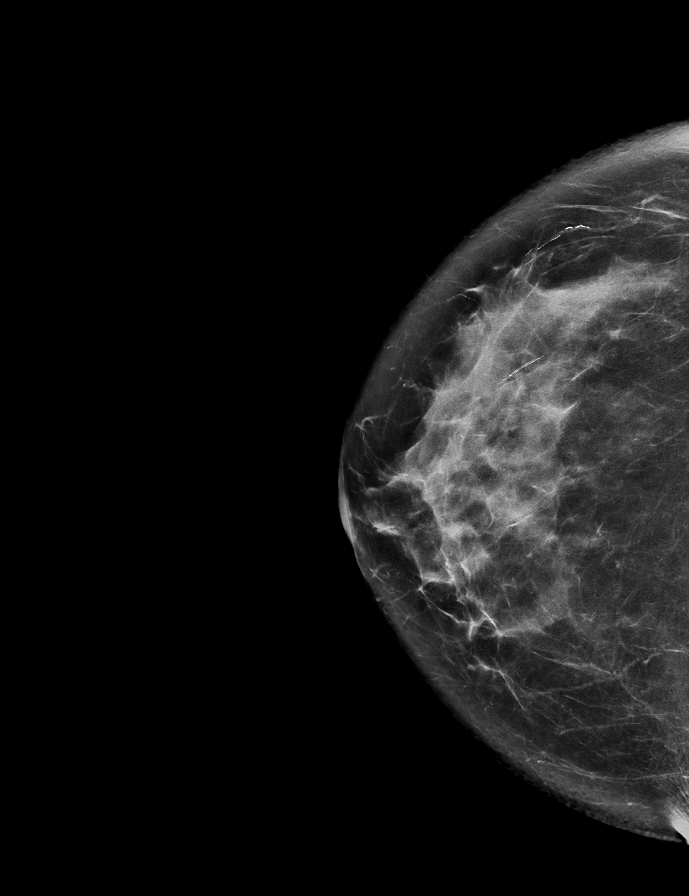

[R MLO synth-2D]
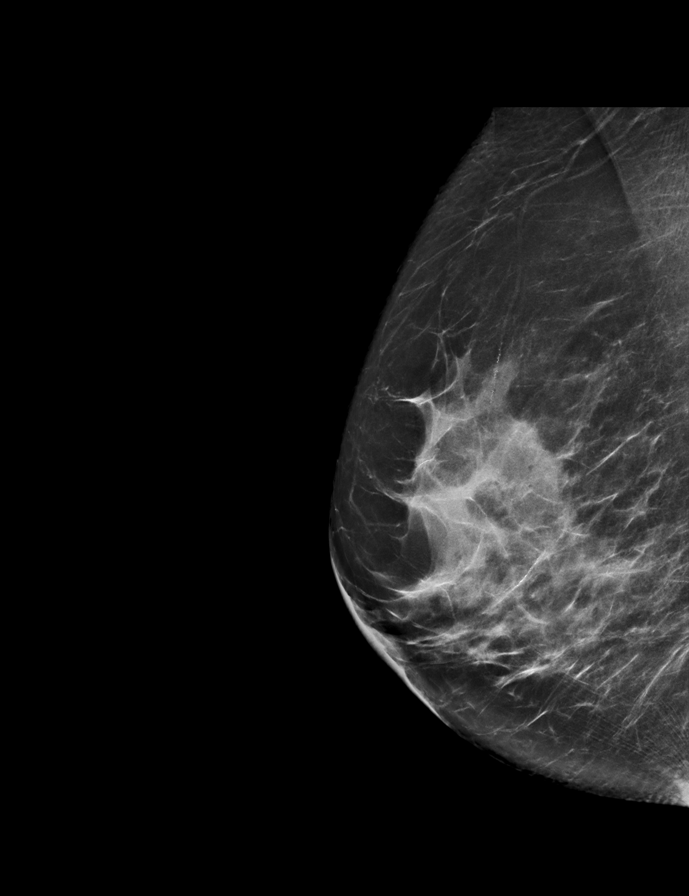

[L MLO tomo · 2 of 77 frames shown]
[frame 25/77]
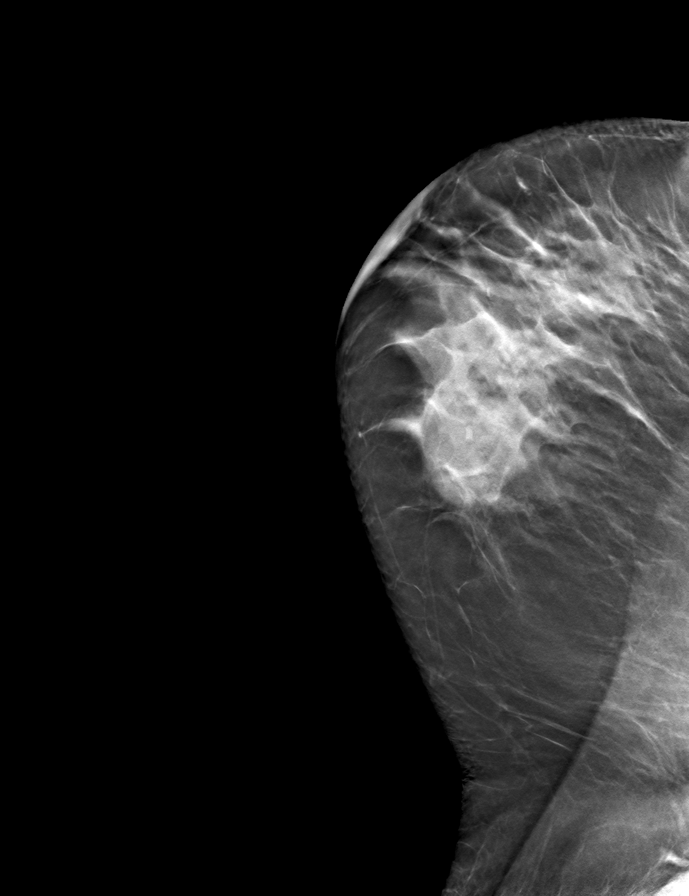
[frame 39/77]
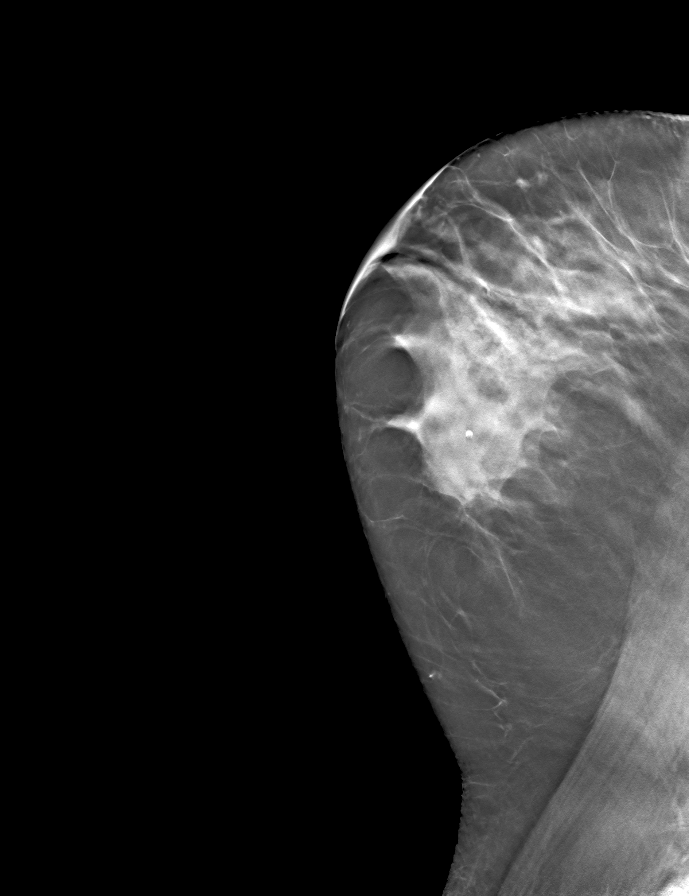

[R MLO tomo · tomo slice 37/73.0]
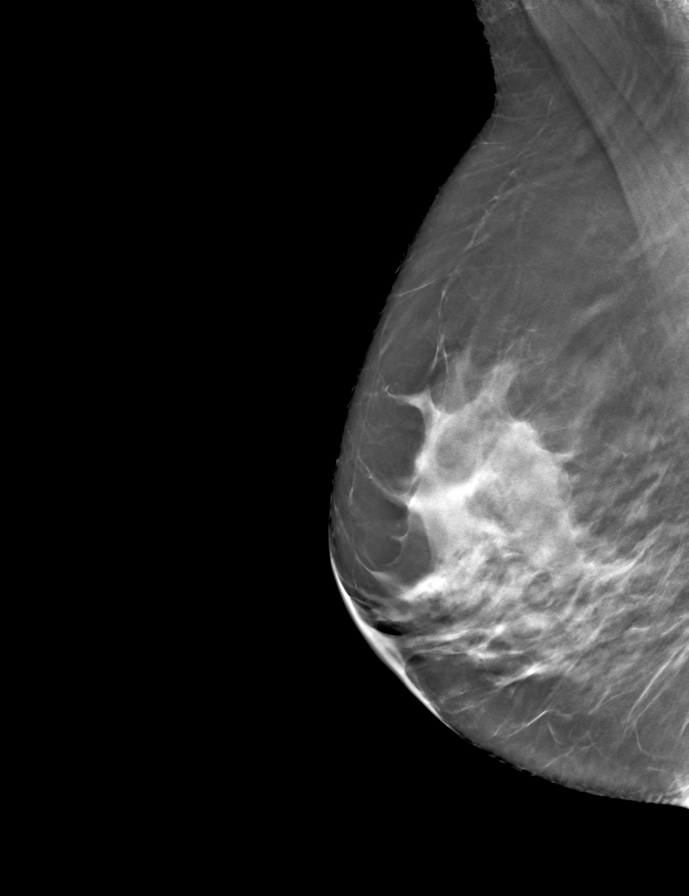

[R CC tomo · tomo slice 39/76.0]
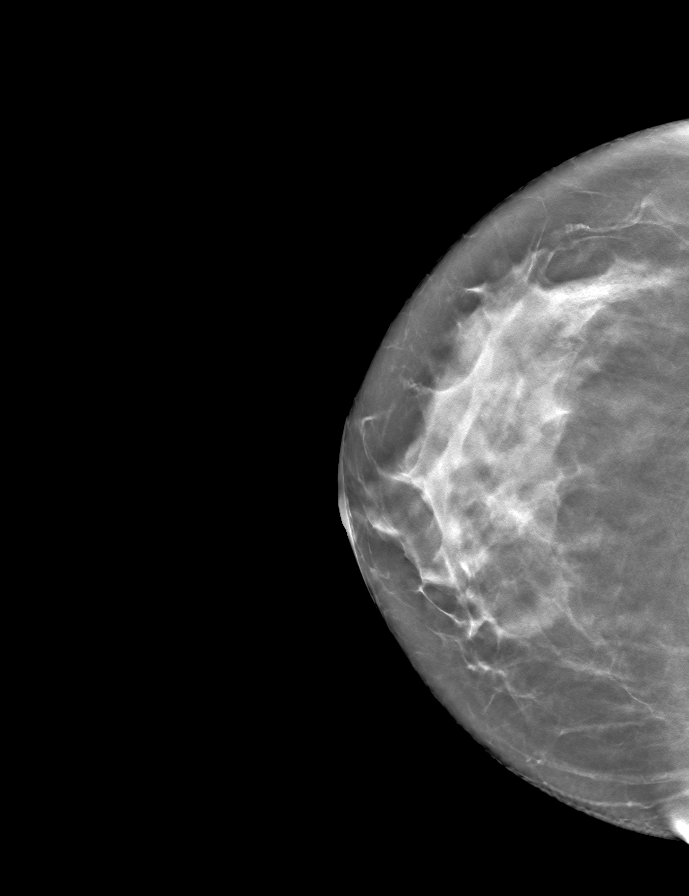

[L CC tomo · tomo slice 38/75.0]
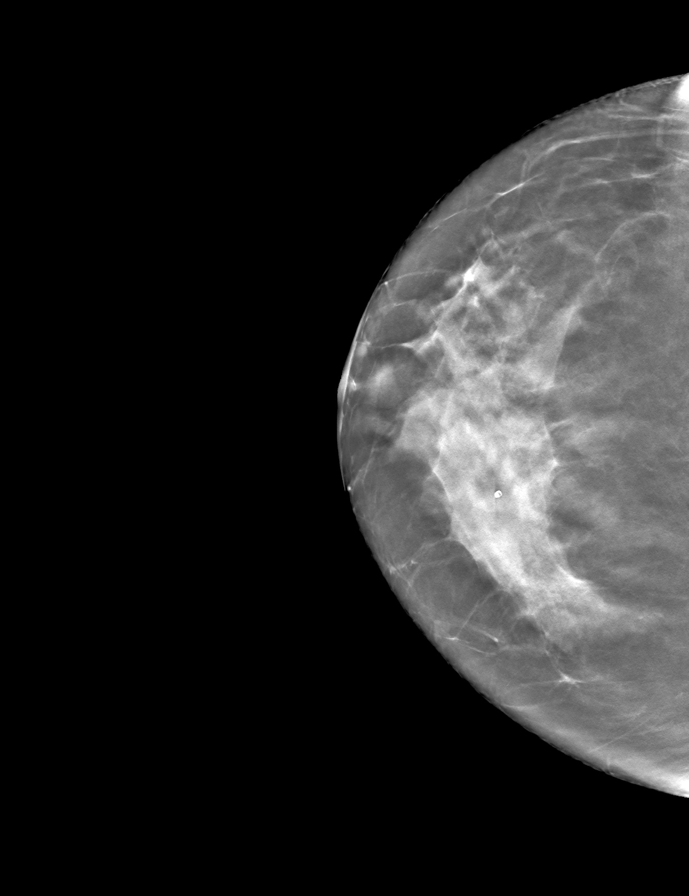

[9 of 24 positions shown; findings below may reference images not displayed]

ACR Breast Density Category c: The breast tissue is heterogeneously
dense, which may obscure small masses.
FINDINGS: There are no findings suspicious for malignancy.
IMPRESSION: No mammographic evidence of malignancy. A result letter of this
screening mammogram will be mailed directly to the patient.

RECOMMENDATION:
Screening mammogram in one year. (Code:Q3-W-BC3)

BI-RADS CATEGORY  1: Negative.

## 2022-01-15 IMAGING — US US ABDOMEN COMPLETE
1 series · 14 of 25 positions shown · non-contrast
Comparison: 12/05/2013

CLINICAL DATA: Hepatomegaly

EXAM:
ABDOMEN ULTRASOUND COMPLETE

[Series 1: us abdomen complete · 14 of 55 slices shown]
[im 1/55]
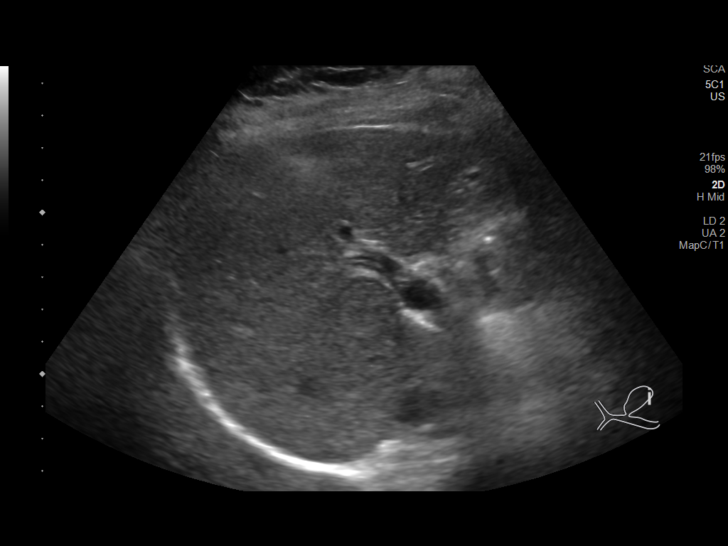
[im 5/55]
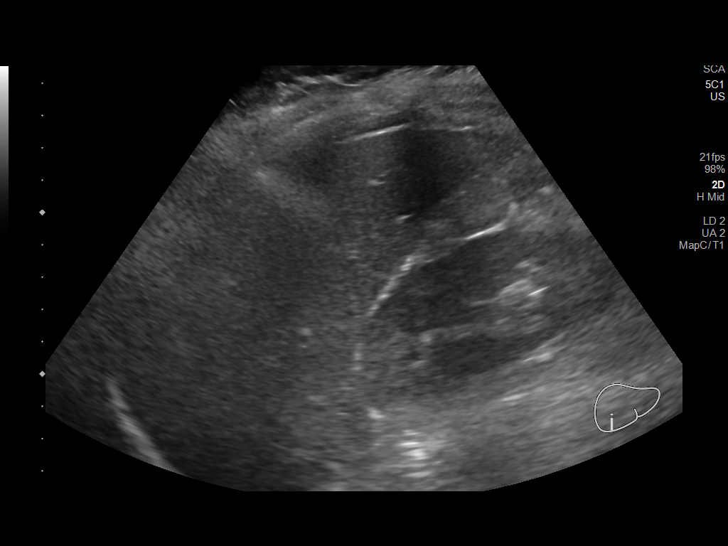
[im 10/55]
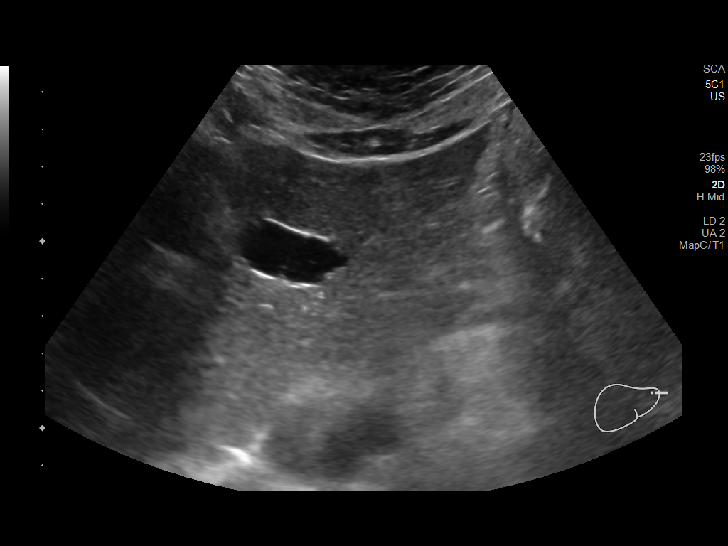
[im 14/55]
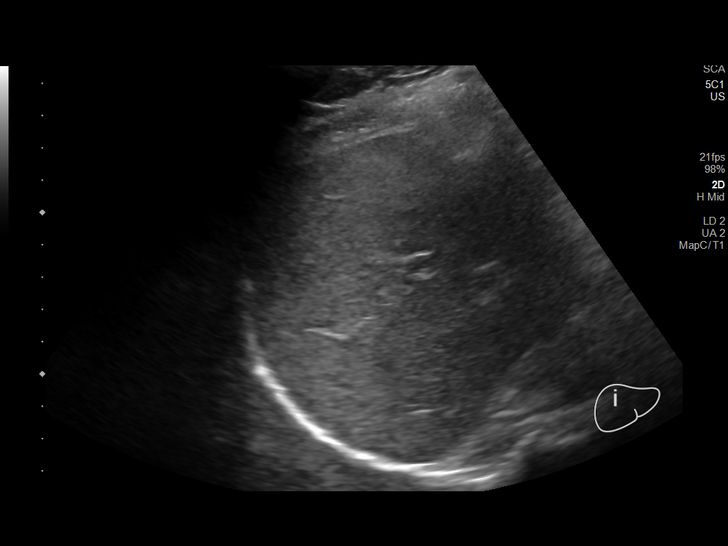
[im 19/55]
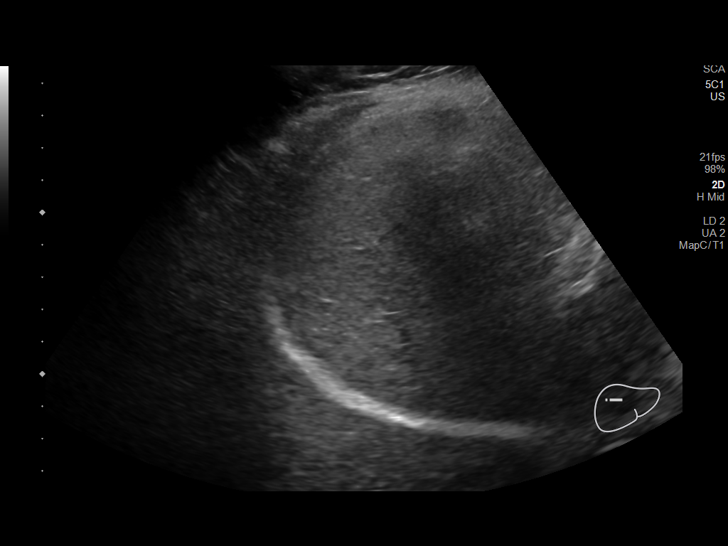
[im 21/55]
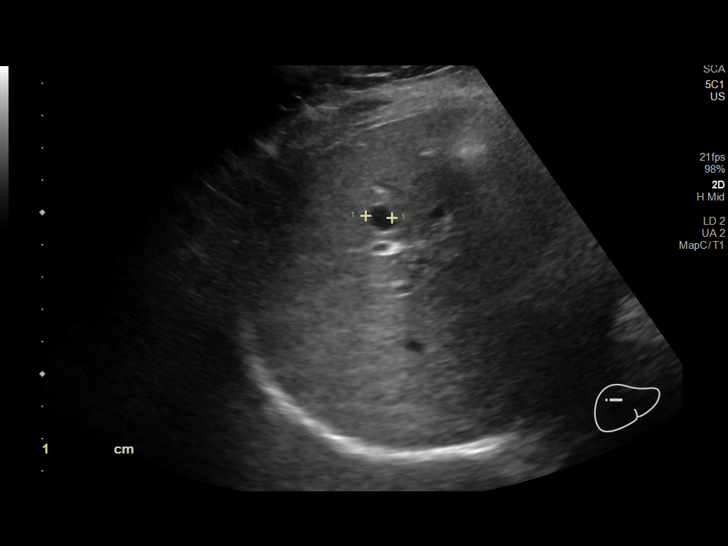
[im 25/55]
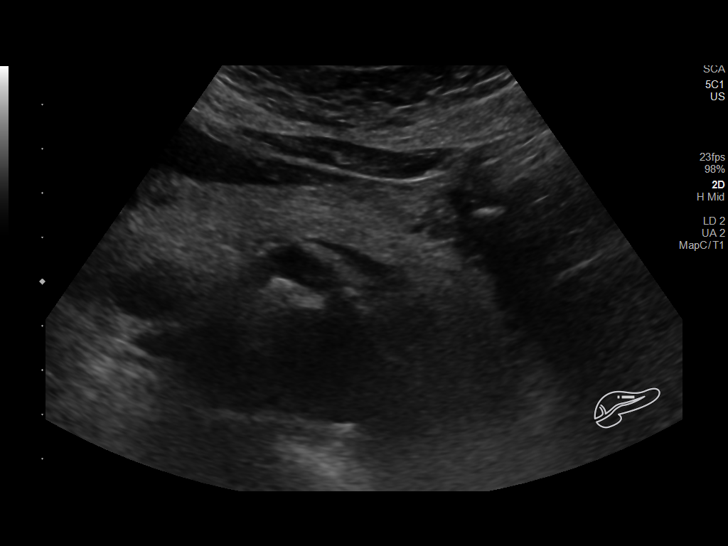
[im 30/55]
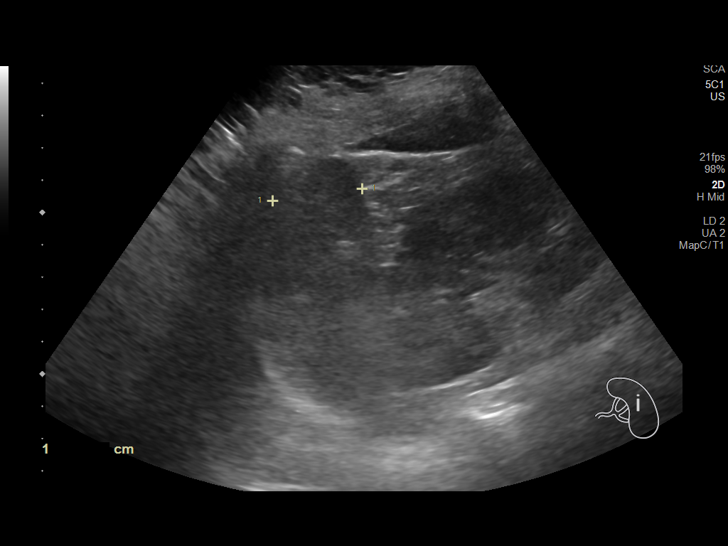
[im 34/55]
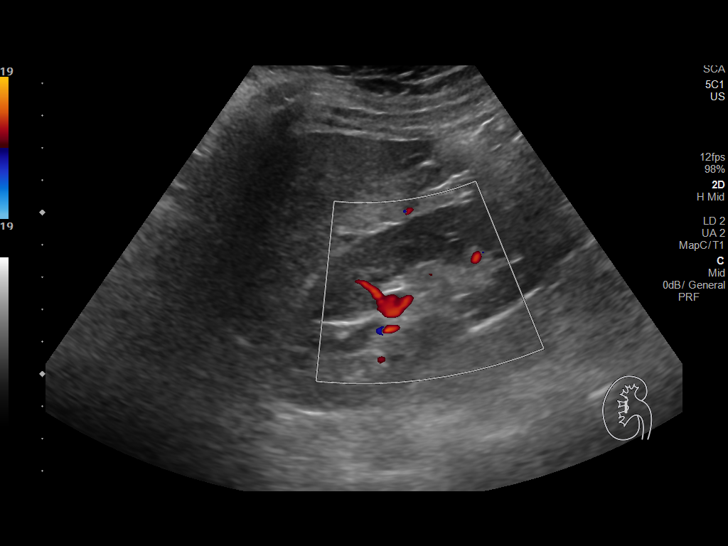
[im 37/55]
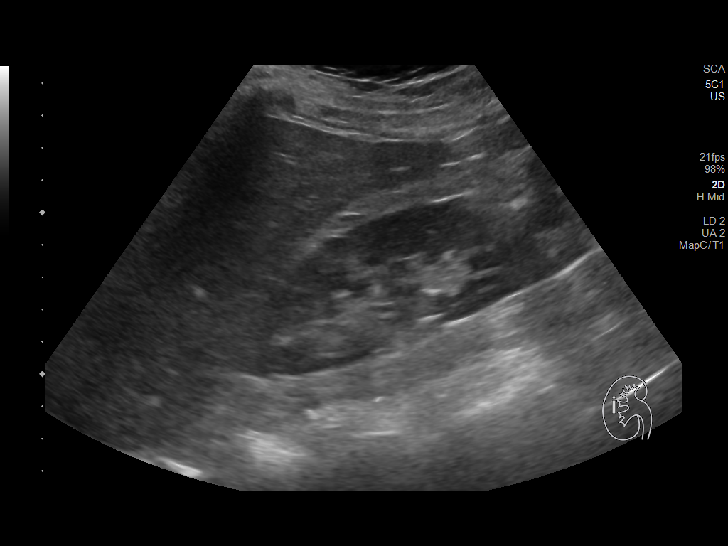
[im 41/55]
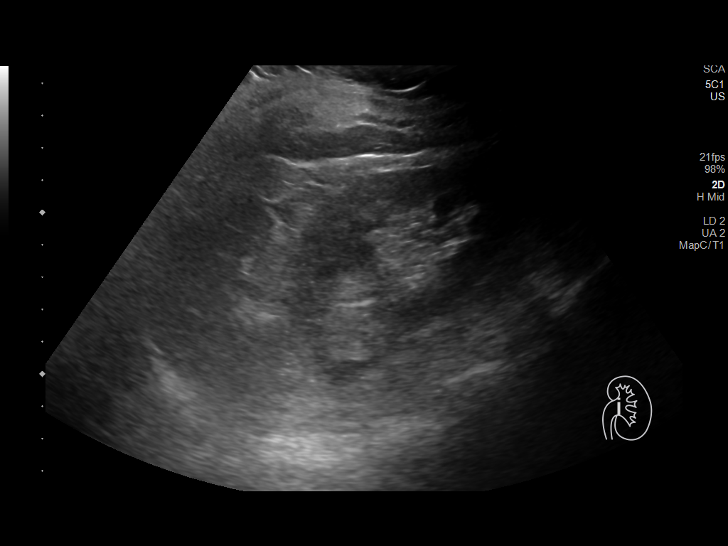
[im 46/55]
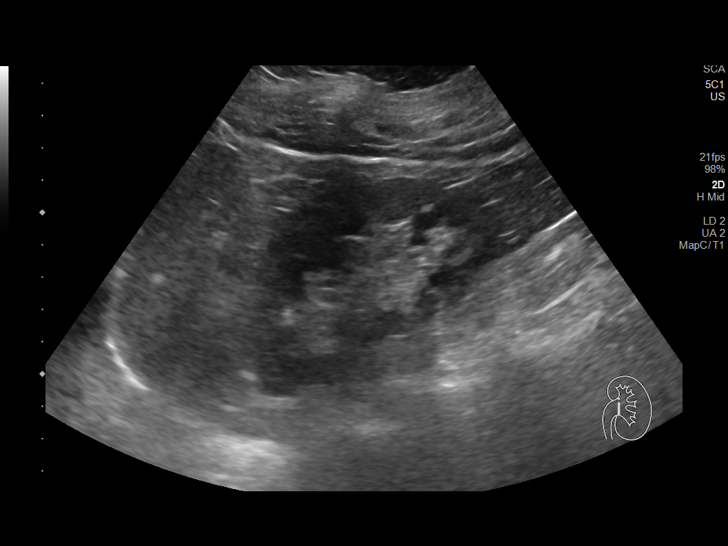
[im 50/55]
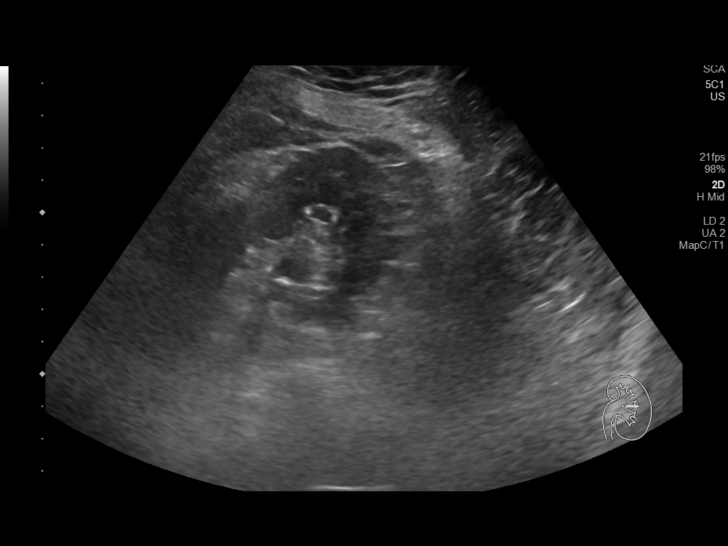
[im 55/55]
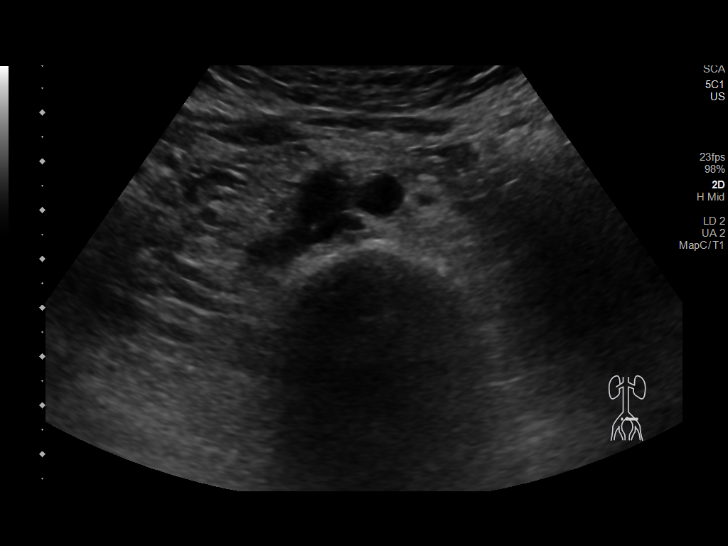

[14 of 25 positions shown; findings below may reference images not displayed]

FINDINGS: Gallbladder: Surgically absent

Common bile duct: Diameter: 2 mm

Liver: Grossly normal liver size and echotexture. Multiple hepatic
cysts are identified, largest in the left lobe measuring up to
cm. No intrahepatic duct dilation. Portal vein is patent on color
Doppler imaging with normal direction of blood flow towards the
liver.

IVC: No abnormality visualized.

Pancreas: Visualized portion unremarkable.

Spleen: Size and appearance within normal limits.

Right Kidney: Length: 9.5 cm. Echogenicity within normal limits. No
mass or hydronephrosis visualized.

Left Kidney: Length: 9.6 cm. Echogenicity within normal limits. No
mass or hydronephrosis visualized.

Abdominal aorta: No evidence of aneurysm.  Diffuse atherosclerosis.

Other findings: None.
IMPRESSION: 1. Stable hepatic cysts.
2. Atherosclerosis.
3. Status post cholecystectomy.

## 2022-02-04 DIAGNOSIS — L814 Other melanin hyperpigmentation: Secondary | ICD-10-CM | POA: Diagnosis not present

## 2022-02-04 DIAGNOSIS — D2310 Other benign neoplasm of skin of unspecified eyelid, including canthus: Secondary | ICD-10-CM | POA: Diagnosis not present

## 2022-02-04 DIAGNOSIS — D224 Melanocytic nevi of scalp and neck: Secondary | ICD-10-CM | POA: Diagnosis not present

## 2022-02-04 DIAGNOSIS — D1801 Hemangioma of skin and subcutaneous tissue: Secondary | ICD-10-CM | POA: Diagnosis not present

## 2022-02-04 DIAGNOSIS — D225 Melanocytic nevi of trunk: Secondary | ICD-10-CM | POA: Diagnosis not present

## 2022-02-04 DIAGNOSIS — L570 Actinic keratosis: Secondary | ICD-10-CM | POA: Diagnosis not present

## 2022-02-04 DIAGNOSIS — D0371 Melanoma in situ of right lower limb, including hip: Secondary | ICD-10-CM | POA: Diagnosis not present

## 2022-02-04 DIAGNOSIS — D2261 Melanocytic nevi of right upper limb, including shoulder: Secondary | ICD-10-CM | POA: Diagnosis not present

## 2022-02-04 DIAGNOSIS — D4989 Neoplasm of unspecified behavior of other specified sites: Secondary | ICD-10-CM | POA: Diagnosis not present

## 2022-02-04 DIAGNOSIS — L821 Other seborrheic keratosis: Secondary | ICD-10-CM | POA: Diagnosis not present

## 2022-02-04 DIAGNOSIS — D485 Neoplasm of uncertain behavior of skin: Secondary | ICD-10-CM | POA: Diagnosis not present

## 2022-02-27 ENCOUNTER — Ambulatory Visit: Payer: Medicare Other | Admitting: Family Medicine

## 2022-03-01 ENCOUNTER — Other Ambulatory Visit: Payer: Self-pay | Admitting: Cardiovascular Disease

## 2022-03-05 ENCOUNTER — Encounter: Payer: Self-pay | Admitting: Family Medicine

## 2022-03-05 DIAGNOSIS — D0371 Melanoma in situ of right lower limb, including hip: Secondary | ICD-10-CM | POA: Diagnosis not present

## 2022-03-05 DIAGNOSIS — L988 Other specified disorders of the skin and subcutaneous tissue: Secondary | ICD-10-CM | POA: Diagnosis not present

## 2022-03-05 DIAGNOSIS — D487 Neoplasm of uncertain behavior of other specified sites: Secondary | ICD-10-CM | POA: Diagnosis not present

## 2022-03-05 DIAGNOSIS — D485 Neoplasm of uncertain behavior of skin: Secondary | ICD-10-CM | POA: Diagnosis not present

## 2022-03-05 HISTORY — PX: MELANOMA EXCISION: SHX5266

## 2022-03-17 ENCOUNTER — Encounter: Payer: Self-pay | Admitting: Family Medicine

## 2022-03-17 ENCOUNTER — Ambulatory Visit (INDEPENDENT_AMBULATORY_CARE_PROVIDER_SITE_OTHER): Payer: Medicare Other | Admitting: Family Medicine

## 2022-03-17 VITALS — BP 139/68 | HR 59 | Ht 61.0 in | Wt 136.2 lb

## 2022-03-17 DIAGNOSIS — I10 Essential (primary) hypertension: Secondary | ICD-10-CM | POA: Diagnosis not present

## 2022-03-17 NOTE — Patient Instructions (Signed)
Nice to meet you today!  Your blood pressure recheck was much better, so I suspect there may be some "white coat syndrome" playing a role. Continue monitoring occasionally at home and let us know if consistently >140/80. Focus on a heart healthy diet and exercise as tolerated.

## 2022-03-17 NOTE — Progress Notes (Signed)
Established Patient Office Visit  Subjective   Patient ID: Brenda Lara, female    DOB: 09-Jan-1951  Age: 71 y.o. MRN: [id number]  CC: HTN f/u    HPI  Patient is here for HTN. Reports she has been having some high readings at recent appointments.   HYPERTENSION: - Medications: atenolol 50 mg BID - Compliance: good - Checking BP at home: occasionally; recently at derm for a procedure (melanoma removed from right leg) and BP was 158/70, not normally that high, seems to be higher at doctor's appointments  - Denies any SOB, CP, vision changes, LE edema, dizziness, palpitations, or medication side effects. - Diet: regular - Exercise: not recently due to restrictions related to dermatology procedure; otherwise usually fairly active       BP Readings from Last 3 Encounters:  03/17/22 139/68  11/12/21 (!) 162/82  11/04/21 114/62          ROS All review of systems negative except what is listed in the HPI    Objective:     BP 139/68   Pulse (!) 59   Ht '5\' 1"'$  (1.549 m)   Wt 136 lb 3.2 oz (61.8 kg)   BMI 25.73 kg/m    Physical Exam Vitals reviewed.  Constitutional:      Appearance: Normal appearance.  HENT:     Head: Normocephalic and atraumatic.  Cardiovascular:     Rate and Rhythm: Normal rate and regular rhythm.     Pulses: Normal pulses.     Heart sounds: Normal heart sounds.  Pulmonary:     Effort: Pulmonary effort is normal.     Breath sounds: Normal breath sounds.  Musculoskeletal:     Cervical back: Normal range of motion and neck supple.     Right lower leg: No edema.     Left lower leg: No edema.  Skin:    General: Skin is warm and dry.  Neurological:     General: No focal deficit present.     Mental Status: She is alert and oriented to person, place, and time. Mental status is at baseline.  Psychiatric:        Mood and Affect: Mood normal.        Behavior: Behavior normal.        Thought Content: Thought content normal.         Judgment: Judgment normal.      No results found for any visits on 03/17/22.    The 10-year ASCVD risk score (Arnett DK, et al., 2019) is: 13%    Assessment & Plan:   Problem List Items Addressed This Visit       Cardiovascular and Mediastinum   Essential hypertension - Primary    Your blood pressure recheck was much better, so I suspect there may be some "white coat syndrome" playing a role. Continue monitoring occasionally at home and let us know if consistently >140/80. Focus on a heart healthy diet and exercise as tolerated.  Continue atenolol. - BP goal <130/80 - monitor and log blood pressures at home - check around the same time each day in a relaxed setting - Limit salt to <2000 mg/day - Follow DASH eating plan (heart healthy diet) - limit alcohol to 2 standard drinks per day for men and 1 per day for women - avoid tobacco products - get at least 2 hours of regular aerobic exercise weekly Patient aware of signs/symptoms requiring further/urgent evaluation.         Return  in about 6 months (around 09/17/2022) for routine PCP f/u .    Terrilyn Saver, NP

## 2022-03-17 NOTE — Assessment & Plan Note (Signed)
Your blood pressure recheck was much better, so I suspect there may be some "white coat syndrome" playing a role. Continue monitoring occasionally at home and let us know if consistently >140/80. Focus on a heart healthy diet and exercise as tolerated.  Continue atenolol. - BP goal <130/80 - monitor and log blood pressures at home - check around the same time each day in a relaxed setting - Limit salt to <2000 mg/day - Follow DASH eating plan (heart healthy diet) - limit alcohol to 2 standard drinks per day for men and 1 per day for women - avoid tobacco products - get at least 2 hours of regular aerobic exercise weekly Patient aware of signs/symptoms requiring further/urgent evaluation.

## 2022-03-28 ENCOUNTER — Other Ambulatory Visit (HOSPITAL_BASED_OUTPATIENT_CLINIC_OR_DEPARTMENT_OTHER): Payer: Self-pay | Admitting: Family Medicine

## 2022-03-28 DIAGNOSIS — Z1231 Encounter for screening mammogram for malignant neoplasm of breast: Secondary | ICD-10-CM

## 2022-05-07 ENCOUNTER — Ambulatory Visit (HOSPITAL_BASED_OUTPATIENT_CLINIC_OR_DEPARTMENT_OTHER)
Admission: RE | Admit: 2022-05-07 | Discharge: 2022-05-07 | Disposition: A | Discharge status: Home / Self Care | Payer: Medicare Other | Source: Ambulatory Visit | Attending: Family Medicine | Admitting: Family Medicine

## 2022-05-07 ENCOUNTER — Encounter (HOSPITAL_BASED_OUTPATIENT_CLINIC_OR_DEPARTMENT_OTHER): Payer: Self-pay

## 2022-05-07 DIAGNOSIS — Z1231 Encounter for screening mammogram for malignant neoplasm of breast: Secondary | ICD-10-CM | POA: Insufficient documentation

## 2022-05-09 ENCOUNTER — Other Ambulatory Visit: Payer: Self-pay | Admitting: Family Medicine

## 2022-05-09 DIAGNOSIS — R928 Other abnormal and inconclusive findings on diagnostic imaging of breast: Secondary | ICD-10-CM

## 2022-05-22 ENCOUNTER — Other Ambulatory Visit: Payer: Self-pay | Admitting: Family Medicine

## 2022-05-22 ENCOUNTER — Ambulatory Visit
Admission: RE | Admit: 2022-05-22 | Discharge: 2022-05-22 | Disposition: A | Discharge status: Home / Self Care | Payer: Medicare Other | Source: Ambulatory Visit | Attending: Family Medicine | Admitting: Family Medicine

## 2022-05-22 DIAGNOSIS — G43019 Migraine without aura, intractable, without status migrainosus: Secondary | ICD-10-CM | POA: Diagnosis not present

## 2022-05-22 DIAGNOSIS — N6314 Unspecified lump in the right breast, lower inner quadrant: Secondary | ICD-10-CM | POA: Diagnosis not present

## 2022-05-22 DIAGNOSIS — N6310 Unspecified lump in the right breast, unspecified quadrant: Secondary | ICD-10-CM

## 2022-05-22 DIAGNOSIS — R928 Other abnormal and inconclusive findings on diagnostic imaging of breast: Secondary | ICD-10-CM

## 2022-05-27 ENCOUNTER — Ambulatory Visit
Admission: RE | Admit: 2022-05-27 | Discharge: 2022-05-27 | Disposition: A | Discharge status: Home / Self Care | Payer: Medicare Other | Source: Ambulatory Visit | Attending: Family Medicine | Admitting: Family Medicine

## 2022-05-27 ENCOUNTER — Other Ambulatory Visit: Payer: Self-pay | Admitting: Family Medicine

## 2022-05-27 DIAGNOSIS — R928 Other abnormal and inconclusive findings on diagnostic imaging of breast: Secondary | ICD-10-CM | POA: Diagnosis not present

## 2022-05-27 DIAGNOSIS — N6001 Solitary cyst of right breast: Secondary | ICD-10-CM | POA: Diagnosis not present

## 2022-05-27 DIAGNOSIS — N6310 Unspecified lump in the right breast, unspecified quadrant: Secondary | ICD-10-CM

## 2022-06-02 ENCOUNTER — Other Ambulatory Visit: Payer: Self-pay | Admitting: Family Medicine

## 2022-07-10 ENCOUNTER — Encounter: Payer: Self-pay | Admitting: Nurse Practitioner

## 2022-07-10 ENCOUNTER — Ambulatory Visit: Payer: Medicare Other | Attending: Nurse Practitioner | Admitting: Nurse Practitioner

## 2022-07-10 VITALS — BP 160/78 | HR 58 | Ht 61.0 in | Wt 139.0 lb

## 2022-07-10 DIAGNOSIS — I2510 Atherosclerotic heart disease of native coronary artery without angina pectoris: Secondary | ICD-10-CM | POA: Diagnosis not present

## 2022-07-10 DIAGNOSIS — I10 Essential (primary) hypertension: Secondary | ICD-10-CM

## 2022-07-10 DIAGNOSIS — E785 Hyperlipidemia, unspecified: Secondary | ICD-10-CM | POA: Diagnosis not present

## 2022-07-10 NOTE — Patient Instructions (Signed)
Medication Instructions:   Your physician recommends that you continue on your current medications as directed. Please refer to the Current Medication list given to you today.   *If you need a refill on your cardiac medications before your next appointment, please call your pharmacy*   Lab Work:  Your physician recommends that you return for a FASTING lipid profile/CMET. Wednesday, March 27. You can come in on the day of your appointment anytime between 7:30-4:30 fasting from midnight the night before.   If you have labs (blood work) drawn today and your tests are completely normal, you will receive your results only by: [place] (if you have MyChart) OR A paper copy in the mail If you have any lab test that is abnormal or we need to change your treatment, we will call you to review the results.   Testing/Procedures:  None ordered.   Follow-Up: At [facility], you and your health needs are our priority.  As part of our continuing mission to provide you with exceptional heart care, we have created designated Provider Care Teams.  These Care Teams include your primary Cardiologist (physician) and Advanced Practice Providers (APPs -  Physician Assistants and Nurse Practitioners) who all work together to provide you with the care you need, when you need it.  We recommend signing up for the patient portal called "MyChart".  Sign up information is provided on this After Visit Summary.  MyChart is used to connect with patients for Virtual Visits (Telemedicine).  Patients are able to view lab/test results, encounter notes, upcoming appointments, etc.  Non-urgent messages can be sent to your provider as well.   To learn more about what you can do with MyChart, go to [email or web address].    Your next appointment:   6 month(s)  The format for your next appointment:   In Person  Provider:   Verlin Dike.    Other Instructions  Your physician wants you to follow-up  in: 6 months with Dr. Johnsie Cancel. You will receive a reminder letter in the mail two months in advance. If you don't receive a letter, please call our office to schedule the follow-up appointment.  Adopting a Healthy Lifestyle.   Weight: Know what a healthy weight is for you (roughly BMI <25) and aim to maintain this. You can calculate your body mass index on your smart phone  Diet: Aim for 7+ servings of fruits and vegetables daily Limit animal fats in diet for cholesterol and heart health - choose grass fed whenever available Avoid highly processed foods (fast food burgers, tacos, fried chicken, pizza, hot dogs, french fries)  Saturated fat comes in the form of butter, lard, coconut oil, margarine, partially hydrogenated oils, and fat in meat. These increase your risk of cardiovascular disease.  Use healthy plant oils, such as olive, canola, soy, corn, sunflower and peanut.  Whole foods such as fruits, vegetables and whole grains have fiber  Men need > 38 grams of fiber per day Women need > 25 grams of fiber per day  Load up on vegetables and fruits - one-half of your plate: Aim for color and variety, and remember that potatoes dont count. Go for whole grains - one-quarter of your plate: Whole wheat, barley, wheat berries, quinoa, oats, brown rice, and foods made with them. If you want pasta, go with whole wheat pasta. Protein power - one-quarter of your plate: Fish, chicken, beans, and nuts are all healthy, versatile protein sources. Limit red meat. You need carbohydrates for energy!  The type of carbohydrate is more important than the amount. Choose carbohydrates such as vegetables, fruits, whole grains, beans, and nuts in the place of white rice, white pasta, potatoes (baked or fried), macaroni and cheese, cakes, cookies, and donuts.  If youre thirsty, drink water. Coffee and tea are good in moderation, but skip sugary drinks and limit milk and dairy products to one or two daily servings. Keep  sugar intake at 6 teaspoons or 24 grams or LESS       Exercise: Aim for 150 min of moderate intensity exercise weekly for heart health, and weights twice weekly for bone health Stay active - any steps are better than no steps! Aim for 7-9 hours of sleep daily      Mediterranean Diet A Mediterranean diet refers to food and lifestyle choices that are based on the traditions of countries located on the [employer]. It focuses on eating more fruits, vegetables, whole grains, beans, nuts, seeds, and heart-healthy fats, and eating less dairy, meat, eggs, and processed foods with added sugar, salt, and fat. This way of eating has been shown to help prevent certain conditions and improve outcomes for people who have chronic diseases, like kidney disease and heart disease. What are tips for following this plan? Reading food labels Check the serving size of packaged foods. For foods such as rice and pasta, the serving size refers to the amount of cooked product, not dry. Check the total fat in packaged foods. Avoid foods that have saturated fat or trans fats. Check the ingredient list for added sugars, such as corn syrup. Shopping  Buy a variety of foods that offer a balanced diet, including: Fresh fruits and vegetables (produce). Grains, beans, nuts, and seeds. Some of these may be available in unpackaged forms or large amounts (in bulk). Fresh seafood. Poultry and eggs. Low-fat dairy products. Buy whole ingredients instead of prepackaged foods. Buy fresh fruits and vegetables in-season from local farmers markets. Buy plain frozen fruits and vegetables. If you do not have access to quality fresh seafood, buy precooked frozen shrimp or canned fish, such as tuna, salmon, or sardines. Stock your pantry so you always have certain foods on hand, such as olive oil, canned tuna, canned tomatoes, rice, pasta, and beans. Cooking Cook foods with extra-virgin olive oil instead of using butter or  other vegetable oils. Have meat as a side dish, and have vegetables or grains as your main dish. This means having meat in small portions or adding small amounts of meat to foods like pasta or stew. Use beans or vegetables instead of meat in common dishes like chili or lasagna. Experiment with different cooking methods. Try roasting, broiling, steaming, and sauting vegetables. Add frozen vegetables to soups, stews, pasta, or rice. Add nuts or seeds for added healthy fats and plant protein at each meal. You can add these to yogurt, salads, or vegetable dishes. Marinate fish or vegetables using olive oil, lemon juice, garlic, and fresh herbs. Meal planning Plan to eat one vegetarian meal one day each week. Try to work up to two vegetarian meals, if possible. Eat seafood two or more times a week. Have healthy snacks readily available, such as: Vegetable sticks with hummus. Greek yogurt. Fruit and nut trail mix. Eat balanced meals throughout the week. This includes: Fruit: 2-3 servings a day. Vegetables: 4-5 servings a day. Low-fat dairy: 2 servings a day. Fish, poultry, or lean meat: 1 serving a day. Beans and legumes: 2 or more servings a week. Nuts  and seeds: 1-2 servings a day. Whole grains: 6-8 servings a day. Extra-virgin olive oil: 3-4 servings a day. Limit red meat and sweets to only a few servings a month. Lifestyle  Cook and eat meals together with your family, when possible. Drink enough fluid to keep your urine pale yellow. Be physically active every day. This includes: Aerobic exercise like running or swimming. Leisure activities like gardening, walking, or housework. Get 7-8 hours of sleep each night. If recommended by your health care provider, drink red wine in moderation. This means 1 glass a day for nonpregnant women and 2 glasses a day for men. A glass of wine equals 5 oz (150 mL). What foods should I eat? Fruits Apples. Apricots. Avocado. Berries. Bananas.  Cherries. Dates. Figs. Grapes. Lemons. Melon. Oranges. Peaches. Plums. Pomegranate. Vegetables Artichokes. Beets. Broccoli. Cabbage. Carrots. Eggplant. Green beans. Chard. Kale. Spinach. Onions. Leeks. Peas. Squash. Tomatoes. Peppers. Radishes. Grains Whole-grain pasta. Brown rice. Bulgur wheat. Polenta. Couscous. Whole-wheat bread. [place]. Meats and other proteins Beans. Almonds. Sunflower seeds. Pine nuts. Peanuts. [place]ath. Salmon. Scallops. Shrimp. Bono. Tilapia. Clams. Oysters. Eggs. Poultry without skin. Dairy Low-fat milk. Cheese. Greek yogurt. Fats and oils Extra-virgin olive oil. Avocado oil. Grapeseed oil. Beverages Water. Red wine. Herbal tea. Sweets and desserts Greek yogurt with honey. Baked apples. Poached pears. Trail mix. Seasonings and condiments Basil. Cilantro. Coriander. Cumin. Mint. Parsley. Sage. Rosemary. Tarragon. Garlic. Oregano. Thyme. Pepper. Balsamic vinegar. Tahini. Hummus. Tomato sauce. Olives. Mushrooms. The items listed above may not be a complete list of foods and beverages you can eat. Contact a dietitian for more information. What foods should I limit? This is a list of foods that should be eaten rarely or only on special occasions. Fruits Fruit canned in syrup. Vegetables Deep-fried potatoes (french fries). Grains Prepackaged pasta or rice dishes. Prepackaged cereal with added sugar. Prepackaged snacks with added sugar. Meats and other proteins Beef. Pork. Lamb. Poultry with skin. Hot dogs. Berniece Salines. Dairy Ice cream. Sour cream. Whole milk. Fats and oils Butter. Canola oil. Vegetable oil. Beef fat (tallow). Lard. Beverages Juice. Sugar-sweetened soft drinks. Beer. Liquor and spirits. Sweets and desserts Cookies. Cakes. Pies. Candy. Seasonings and condiments Mayonnaise. Pre-made sauces and marinades. The items listed above may not be a complete list of foods and beverages you should limit. Contact a dietitian for more  information. Summary The Mediterranean diet includes both food and lifestyle choices. Eat a variety of fresh fruits and vegetables, beans, nuts, seeds, and whole grains. Limit the amount of red meat and sweets that you eat. If recommended by your health care provider, drink red wine in moderation. This means 1 glass a day for nonpregnant women and 2 glasses a day for men. A glass of wine equals 5 oz (150 mL). This information is not intended to replace advice given to you by your health care provider. Make sure you discuss any questions you have with your health care provider. Document Revised: 10/07/2019 Document Reviewed: 08/04/2019 Else[employer] Patient Education  [place]

## 2022-07-10 NOTE — Progress Notes (Signed)
Cardiology Office Note:    Date:  07/10/2022   ID:  Brenda Lara, DOB 01/18/1951, MRN [id number]  PCP:  Mosie Lukes, MD   [facility] Providers Cardiologist:  Jenkins Rouge, MD     Referring MD: Mosie Lukes, MD   Chief Complaint: annual follow-up  History of Present Illness:    Brenda Lara is a very pleasant 71 y.o. female with a hx of CAD s/p stenting of RCA in 2006 and LAD in 2005, chronic back pain, white coat hypertension, hyperlipidemia, and colon cancer  Last cath in 2010 revealed widely patent stents.  Exercise treadmill test 07/10/20 was normal.  Statin changed to Lipitor in February 2019 with target LDL reached.  Colon cancer in remission post colectomy.  Prior history of EF 55% with some apical hypokinesis but no recent echocardiogram.  She has maintained consistent follow-up for many years and was last seen in clinic by Dr. Johnsie Cancel on 06/25/21 at which time no changes were made to her treatment plan and she was advised to return in 1 year for follow-up.  Today, she is here alone for follow-up. Reports she is overall doing well. Has a lot of neck and back and shoulder issues and sometimes wonders if she is unaware of chest pain. Bilateral arm pain was previous symptom of angina at age 66 when she had initial stent to LAD. She had passed multiple stress tests prior to that and finally underwent cardiac cath which revealed 80% blockage in LAD.  Was having a nuclear stress test and 2006 when blockage to RCA was noted. She was asymptomatic at that time. Doing chair exercise program at church. Walking does not aggravate her neck and back pain but she is no longer walking on a consistent basis. No chest pain, shortness of breath, arm pain, or other concerns with activity. Occasionally gets dizzy when bending pulling weeds. Home BP typically 118-130 over 62. She denies lower extremity edema, fatigue, palpitations, melena, hematuria, hemoptysis, diaphoresis, weakness,  presyncope, syncope, orthopnea, and PND.   Past Medical History:  Diagnosis Date   Allergy    Anemia    Angina pectoris    Anxiety    Cataract    Cervical cancer screening 01/31/2016   Menarche at 12 Regular and moderate flow No history of abnormal pap in past G1P1, s/p 1 SVD No history of abnormal MGM No concerns today gyn surgeries b/l tubal ligation and b/y oophorectomy   Colon cancer ([place]) 2010   Coronary artery disease 2001/2005   S/P stenting to RCA and LAD   GERD (gastroesophageal reflux disease)    Hearing loss 08/05/2015   History of chicken pox    HTN (hypertension)    Menopause    Preventative health care 01/31/2016   Pure hypercholesterolemia    Vitamin D deficiency 06/01/2017    Past Surgical History:  Procedure Laterality Date   APPENDECTOMY  02/21/2009   BACK SURGERY  1978, 1988   multiple   BILATERAL OOPHORECTOMY  09/15/2008   CHOLECYSTECTOMY  07/16/2008   COLON SURGERY  09/15/2008   right colectomy   COLONOSCOPY  01/26/2014   NORMAL    CORONARY STENT PLACEMENT  2001/2005   RCA   CYSTECTOMY     left ovary   MELANOMA EXCISION Right 03/05/2022   right lower leg lesion removed    Current Medications: Current Meds  Medication Sig   ASPIRIN LOW DOSE 81 MG tablet TAKE 1 TABLET (81 MG TOTAL) BY MOUTH DAILY.  SWALLOW WHOLE.   atenolol (TENORMIN) 50 MG tablet TAKE 1 TABLET BY MOUTH TWICE A DAY   atorvastatin (LIPITOR) 40 MG tablet TAKE 1 TABLET BY MOUTH EVERY DAY   meclizine (ANTIVERT) 25 MG tablet Take 1 tablet (25 mg total) by mouth 3 (three) times daily as needed for dizziness.   Multiple Vitamin (MULTIVITAMIN) tablet Take 1 tablet by mouth daily.   nitroGLYCERIN (NITROSTAT) 0.4 MG SL tablet Place 1 tablet (0.4 mg total) under the tongue every 5 (five) minutes as needed for chest pain.   omeprazole (PRILOSEC) 40 MG capsule TAKE 1 CAPSULE (40 MG TOTAL) BY MOUTH DAILY.   topiramate (TOPAMAX) 100 MG tablet Take 100 mg by mouth daily.      Allergies:    Patient has no known allergies.   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, no dietary restrictions  Other Topics Concern   Not on file  Social History Narrative   Not on file   Social Determinants of Health   Financial Resource Strain: [place]  (08/02/2021)   Overall Financial Resource Strain (CARDIA)    Difficulty of Paying Living Expenses: Not hard at all  Food Insecurity: No Food Insecurity (08/02/2021)   Hunger Vital Sign    Worried About Running Out of Food in the Last Year: Never true    [place] in the Last Year: Never true  Transportation Needs: No Transportation Needs (08/02/2021)   PRAPARE - Hydrologist (Medical): No    Lack of Transportation (Non-Medical): No  Physical Activity: Inactive (08/02/2021)   Exercise Vital Sign    Days of Exercise per Week: 0 days    Minutes of Exercise per Session: 0 min  Stress: No Stress Concern Present (08/02/2021)   [place]    Feeling of Stress : Only a little  Social Connections: Socially Integrated (08/02/2021)   Social Connection and Isolation Panel [NHANES]    Frequency of Communication with Friends and Family: More than three times a week    Frequency of Social Gatherings with Friends and Family: More than three times a week    Attends Religious Services: More than 4 times per year    Active Member of [employer] or Organizations: Yes    Attends  Music therapist: More than 4 times per year    Marital Status: Married     Family History: The patient's family history includes Alcohol abuse in her father; Arthritis in her father; Colon polyps in her mother; Coronary artery disease in an other family member; Heart attack in her father and mother; Heart disease in her father and mother; Hypertension in her mother; Leukemia in her mother; Prostate cancer in her father; Thyroid disease in her daughter; Transient ischemic attack in her mother. There is no history of Colon cancer, Pancreatic cancer, Rectal cancer, Stomach  cancer, or Esophageal cancer.  ROS:   Please see the history of present illness.    + chronic neck/back/shoulder pain All other systems reviewed and are negative.  Labs/Other Studies Reviewed:    The following studies were reviewed today:  ETT 07/10/20  Fair exercise capacity, achieved 6.3 METS Hypertensive response to exercise, peak BP 207/78 No stress EKG changes to suggest ischemia  Cardiac Cath 05/2009  Widely patent stents  Recent Labs: 10/16/2021: ALT 17; BUN 16; Creatinine, Ser 0.73; Hemoglobin 12.3; Platelets 241.0; Potassium 4.2; Sodium 141; TSH 3.17  Recent Lipid Panel    Component Value Date/Time   CHOL 131 10/16/2021 0753   CHOL 119 01/20/2018 0834   TRIG 120.0 10/16/2021 0753   HDL 55.40 10/16/2021 0753   HDL 61 01/20/2018 0834   CHOLHDL 2 10/16/2021 0753   VLDL 24.0 10/16/2021 0753   LDLCALC 52 10/16/2021 0753   LDLCALC 43 01/20/2018 0834     Risk Assessment/Calculations:      Physical Exam:    VS:  BP (!) 160/78   Pulse (!) 58   Ht '5\' 1"'  (1.549 m)   Wt 139 lb (63 kg)   SpO2 98%   BMI 26.26 kg/m     Wt Readings from Last 3 Encounters:  07/10/22 139 lb (63 kg)  03/17/22 136 lb 3.2 oz (61.8 kg)  11/04/21 137 lb (62.1 kg)     GEN:  Well nourished, well developed in no acute distress HEENT: Normal NECK: No JVD; No carotid bruits CARDIAC: RRR, no murmurs, rubs,  gallops RESPIRATORY:  Clear to auscultation without rales, wheezing or rhonchi  ABDOMEN: Soft, non-tender, non-distended MUSCULOSKELETAL:  No edema; No deformity. 2+ pedal pulses, equal bilaterally SKIN: Warm and dry NEUROLOGIC:  Alert and oriented x 3 PSYCHIATRIC:  Normal affect   EKG:  EKG is ordered today.  The ekg ordered today demonstrates sinus bradycardia at 58 bpm, no ST abnormality  HYPERTENSION CONTROL Vitals:   07/10/22 0849 07/10/22 0943  BP: (!) 164/76 (!) 160/78    The patient's blood pressure is elevated above target today.  In order to address the patient's elevated BP: The blood pressure is usually elevated in clinic.  Blood pressures monitored at home have been optimal.      Diagnoses:    1. Coronary artery disease involving native coronary artery of native heart without angina pectoris   2. Hyperlipidemia LDL goal <70   3. Essential hypertension   4. White coat syndrome with diagnosis of hypertension    Assessment and Plan:     CAD without angina: Prior stenting in LAD 2005, RCA 2006 with patent stents identified on cath 2010 with 20-30% stenosis edge of LAD stent and 30% discrete stenosis RCA at that time. She denies chest pain, dyspnea, or other symptoms concerning for angina.  No indication for further ischemic evaluation at this time.  No bleeding problems on aspirin.  Continue aspirin, atenolol, atorvastatin.  Hyperlipidemia LDL goal < 70: LDL 52 on 10/16/21.  We will recheck fasting lipid panel and 6 months. Continue atorvastatin. Encouraged heart healthy, mostly plant based diet and 150 minutes moderate intensity exercise each week. Encouraged her to aim to get 8000-10000 steps each day.   Hypertension/White coat hypertension: BP is elevated today. She has consistently higher readings at appointments.  Home BP very well controlled. Advised her to continue to monitor on a routine basis and to report concerns to [place] prior to next office visit. Low sodium diet  encouraged.  Disposition: 6 months with Dr. Johnsie Cancel with fasting labs prior  Medication Adjustments/Labs and Tests Ordered: Current medicines are reviewed at length with the patient today.  Concerns regarding medicines are outlined above.  Orders Placed This Encounter  Procedures   Lipid Profile   Comp Met (CMET)   EKG 12-Lead   No orders of the defined types were placed in this encounter.   Patient Instructions  Medication Instructions:   Your physician recommends that you continue on your current medications as directed. Please refer to the Current Medication list given to you today.   *If you need a refill on your cardiac medications before your next appointment, please call your pharmacy*   Lab Work:  Your physician recommends that you return for a FASTING lipid profile/CMET. Wednesday, March 27. You can come in on the day of your appointment anytime between 7:30-4:30 fasting from midnight the night before.   If you have labs (blood work) drawn today and your tests are completely normal, you will receive your results only by: [place] (if you have MyChart) OR A paper copy in the mail If you have any lab test that is abnormal or we need to change your treatment, we will call you to review the results.   Testing/Procedures:  None ordered.   Follow-Up: At [facility], you and your health needs are our priority.  As part of our continuing mission to provide you with exceptional heart care, we have created designated Provider Care Teams.  These Care Teams include your primary Cardiologist (physician) and Advanced Practice Providers (APPs -  Physician Assistants and Nurse Practitioners) who all work together to provide you with the care you need, when you need it.  We recommend signing up for the patient portal called "MyChart".  Sign up information is provided on this After Visit Summary.  MyChart is used to connect with patients for Virtual Visits  (Telemedicine).  Patients are able to view lab/test results, encounter notes, upcoming appointments, etc.  Non-urgent messages can be sent to your provider as well.   To learn more about what you can do with MyChart, go to [email or web address].    Your next appointment:   6 month(s)  The format for your next appointment:   In Person  Provider:   Verlin Dike.    Other Instructions  Your physician wants you to follow-up in: 6 months with Dr. Johnsie Cancel. You will receive a reminder letter in the mail two months in advance. If you don't receive a letter, please call our office to schedule the follow-up appointment.  Adopting a Healthy Lifestyle.   Weight: Know what a healthy weight is for you (roughly BMI <25) and aim to maintain this. You can calculate your body mass index on your smart phone  Diet: Aim for 7+ servings of fruits and vegetables daily Limit animal fats in diet for cholesterol and heart health - choose grass fed whenever available Avoid highly processed foods (fast food burgers, tacos, fried chicken, pizza, hot dogs, french fries)  Saturated fat comes in the form of butter, lard, coconut oil, margarine, partially hydrogenated oils, and fat in meat. These increase your risk of cardiovascular disease.  Use healthy plant oils, such as olive, canola, soy, corn, sunflower and peanut.  Whole foods such as fruits, vegetables and whole grains have fiber  Men need > 38 grams of fiber per day Women need > 25 grams of fiber per day  Load up on vegetables and fruits - one-half of  your plate: Aim for color and variety, and remember that potatoes dont count. Go for whole grains - one-quarter of your plate: Whole wheat, barley, wheat berries, quinoa, oats, brown rice, and foods made with them. If you want pasta, go with whole wheat pasta. Protein power - one-quarter of your plate: Fish, chicken, beans, and nuts are all healthy, versatile protein sources. Limit red meat. You need  carbohydrates for energy! The type of carbohydrate is more important than the amount. Choose carbohydrates such as vegetables, fruits, whole grains, beans, and nuts in the place of white rice, white pasta, potatoes (baked or fried), macaroni and cheese, cakes, cookies, and donuts.  If youre thirsty, drink water. Coffee and tea are good in moderation, but skip sugary drinks and limit milk and dairy products to one or two daily servings. Keep sugar intake at 6 teaspoons or 24 grams or LESS       Exercise: Aim for 150 min of moderate intensity exercise weekly for heart health, and weights twice weekly for bone health Stay active - any steps are better than no steps! Aim for 7-9 hours of sleep daily      Mediterranean Diet A Mediterranean diet refers to food and lifestyle choices that are based on the traditions of countries located on the [employer]. It focuses on eating more fruits, vegetables, whole grains, beans, nuts, seeds, and heart-healthy fats, and eating less dairy, meat, eggs, and processed foods with added sugar, salt, and fat. This way of eating has been shown to help prevent certain conditions and improve outcomes for people who have chronic diseases, like kidney disease and heart disease. What are tips for following this plan? Reading food labels Check the serving size of packaged foods. For foods such as rice and pasta, the serving size refers to the amount of cooked product, not dry. Check the total fat in packaged foods. Avoid foods that have saturated fat or trans fats. Check the ingredient list for added sugars, such as corn syrup. Shopping  Buy a variety of foods that offer a balanced diet, including: Fresh fruits and vegetables (produce). Grains, beans, nuts, and seeds. Some of these may be available in unpackaged forms or large amounts (in bulk). Fresh seafood. Poultry and eggs. Low-fat dairy products. Buy whole ingredients instead of prepackaged foods. Buy  fresh fruits and vegetables in-season from local farmers markets. Buy plain frozen fruits and vegetables. If you do not have access to quality fresh seafood, buy precooked frozen shrimp or canned fish, such as tuna, salmon, or sardines. Stock your pantry so you always have certain foods on hand, such as olive oil, canned tuna, canned tomatoes, rice, pasta, and beans. Cooking Cook foods with extra-virgin olive oil instead of using butter or other vegetable oils. Have meat as a side dish, and have vegetables or grains as your main dish. This means having meat in small portions or adding small amounts of meat to foods like pasta or stew. Use beans or vegetables instead of meat in common dishes like chili or lasagna. Experiment with different cooking methods. Try roasting, broiling, steaming, and sauting vegetables. Add frozen vegetables to soups, stews, pasta, or rice. Add nuts or seeds for added healthy fats and plant protein at each meal. You can add these to yogurt, salads, or vegetable dishes. Marinate fish or vegetables using olive oil, lemon juice, garlic, and fresh herbs. Meal planning Plan to eat one vegetarian meal one day each week. Try to work up to two vegetarian meals,  if possible. Eat seafood two or more times a week. Have healthy snacks readily available, such as: Vegetable sticks with hummus. Greek yogurt. Fruit and nut trail mix. Eat balanced meals throughout the week. This includes: Fruit: 2-3 servings a day. Vegetables: 4-5 servings a day. Low-fat dairy: 2 servings a day. Fish, poultry, or lean meat: 1 serving a day. Beans and legumes: 2 or more servings a week. Nuts and seeds: 1-2 servings a day. Whole grains: 6-8 servings a day. Extra-virgin olive oil: 3-4 servings a day. Limit red meat and sweets to only a few servings a month. Lifestyle  Cook and eat meals together with your family, when possible. Drink enough fluid to keep your urine pale yellow. Be physically  active every day. This includes: Aerobic exercise like running or swimming. Leisure activities like gardening, walking, or housework. Get 7-8 hours of sleep each night. If recommended by your health care provider, drink red wine in moderation. This means 1 glass a day for nonpregnant women and 2 glasses a day for men. A glass of wine equals 5 oz (150 mL). What foods should I eat? Fruits Apples. Apricots. Avocado. Berries. Bananas. Cherries. Dates. Figs. Grapes. Lemons. Melon. Oranges. Peaches. Plums. Pomegranate. Vegetables Artichokes. Beets. Broccoli. Cabbage. Carrots. Eggplant. Green beans. Chard. Kale. Spinach. Onions. Leeks. Peas. Squash. Tomatoes. Peppers. Radishes. Grains Whole-grain pasta. Brown rice. Bulgur wheat. Polenta. Couscous. Whole-wheat bread. [place]. Meats and other proteins Beans. Almonds. Sunflower seeds. Pine nuts. Peanuts. [place]. Salmon. Scallops. Shrimp. [place]. Tilapia. Clams. Oysters. Eggs. Poultry without skin. Dairy Low-fat milk. Cheese. Greek yogurt. Fats and oils Extra-virgin olive oil. Avocado oil. Grapeseed oil. Beverages Water. Red wine. Herbal tea. Sweets and desserts Greek yogurt with honey. Baked apples. Poached pears. Trail mix. Seasonings and condiments Basil. Cilantro. Coriander. Cumin. Mint. Parsley. Sage. Rosemary. Tarragon. Garlic. Oregano. Thyme. Pepper. Balsamic vinegar. Tahini. Hummus. Tomato sauce. Olives. Mushrooms. The items listed above may not be a complete list of foods and beverages you can eat. Contact a dietitian for more information. What foods should I limit? This is a list of foods that should be eaten rarely or only on special occasions. Fruits Fruit canned in syrup. Vegetables Deep-fried potatoes (french fries). Grains Prepackaged pasta or rice dishes. Prepackaged cereal with added sugar. Prepackaged snacks with added sugar. Meats and other proteins Beef. Pork. Lamb. Poultry with skin. Hot dogs. Bernie[place] Salines. Dairy Ice cream.  Sour cream. Whole milk. Fats and oils Butter. Canola oil. Vegetable oil. Beef fat (tallow). Lard. Beverages Juice. Sugar-sweetened soft drinks. Beer. Liquor and spirits. Sweets and desserts Cookies. Cakes. Pies. Candy. Seasonings and condiments Mayonnaise. Pre-made sauces and marinades. The items listed above may not be a complete list of foods and beverages you should limit. Contact a dietitian for more information. Summary The Mediterranean diet includes both food and lifestyle choices. Eat a variety of fresh fruits and vegetables, beans, nuts, seeds, and whole grains. Limit the amount of red meat and sweets that you eat. If recommended by your health care provider, drink red wine in moderation. This means 1 glass a day for nonpregnant women and 2 glasses a day for men. A glass of wine equals 5 oz (150 mL). This information is not intended to replace advice given to you by your health care provider. Make sure you discuss any questions you have with your health care provider. Document Revised: 10/07/2019 Document Reviewed: 08/04/2019 Elsevier Patient Education  [place]  Signed, Emmaline Life, NP  07/10/2022 9:47 AM    [place]

## 2022-07-15 ENCOUNTER — Other Ambulatory Visit: Payer: Self-pay | Admitting: Cardiovascular Disease

## 2022-07-18 ENCOUNTER — Other Ambulatory Visit: Payer: Self-pay | Admitting: Cardiovascular Disease

## 2022-07-28 ENCOUNTER — Other Ambulatory Visit: Payer: Self-pay | Admitting: Family Medicine

## 2022-08-04 ENCOUNTER — Ambulatory Visit (INDEPENDENT_AMBULATORY_CARE_PROVIDER_SITE_OTHER): Payer: Medicare Other | Admitting: *Deleted

## 2022-08-04 DIAGNOSIS — Z0000 Encounter for general adult medical examination without abnormal findings: Secondary | ICD-10-CM

## 2022-08-04 NOTE — Patient Instructions (Signed)
Brenda Lara , Thank you for taking time to come for your Medicare Wellness Visit. I appreciate your ongoing commitment to your health goals. Please review the following plan we discussed and let me know if I can assist you in the future.   These are the goals we discussed:  Goals      maintain healthy lifestyle        This is a list of the screening recommended for you and due dates:  Health Maintenance  Topic Date Due   COVID-19 Vaccine (4 - Mixed Product risk series) 08/06/2021   Zoster (Shingles) Vaccine (2 of 2) 09/15/2022   Mammogram  05/08/2023   Medicare Annual Wellness Visit  08/05/2023   Colon Cancer Screening  07/01/2026   Pneumonia Vaccine  Completed   Flu Shot  Completed   DEXA scan (bone density measurement)  Completed   Hepatitis C Screening: USPSTF Recommendation to screen - Ages 18-79 yo.  Completed   HPV Vaccine  Aged Out     Next appointment: Follow up in one year for your annual wellness visit.   Preventive Care 71 Years and Older, Female Preventive care refers to lifestyle choices and visits with your health care provider that can promote health and wellness. What does preventive care include? A yearly physical exam. This is also called an annual well check. Dental exams once or twice a year. Routine eye exams. Ask your health care provider how often you should have your eyes checked. Personal lifestyle choices, including: Daily care of your teeth and gums. Regular physical activity. Eating a healthy diet. Avoiding tobacco and drug use. Limiting alcohol use. Practicing safe sex. Taking low-dose aspirin every day. Taking vitamin and mineral supplements as recommended by your health care provider. What happens during an annual well check? The services and screenings done by your health care provider during your annual well check will depend on your age, overall health, lifestyle risk factors, and family history of disease. Counseling  Your health care  provider may ask you questions about your: Alcohol use. Tobacco use. Drug use. Emotional well-being. Home and relationship well-being. Sexual activity. Eating habits. History of falls. Memory and ability to understand (cognition). Work and work Statistician. Reproductive health. Screening  You may have the following tests or measurements: Height, weight, and BMI. Blood pressure. Lipid and cholesterol levels. These may be checked every 5 years, or more frequently if you are over 71 years old. Skin check. Lung cancer screening. You may have this screening every year starting at age 71 if you have a 30-pack-year history of smoking and currently smoke or have quit within the past 15 years. Fecal occult blood test (FOBT) of the stool. You may have this test every year starting at age 71. Flexible sigmoidoscopy or colonoscopy. You may have a sigmoidoscopy every 5 years or a colonoscopy every 10 years starting at age 71. Hepatitis C blood test. Hepatitis B blood test. Sexually transmitted disease (STD) testing. Diabetes screening. This is done by checking your blood sugar (glucose) after you have not eaten for a while (fasting). You may have this done every 1-3 years. Bone density scan. This is done to screen for osteoporosis. You may have this done starting at age 71. Mammogram. This may be done every 1-2 years. Talk to your health care provider about how often you should have regular mammograms. Talk with your health care provider about your test results, treatment options, and if necessary, the need for more tests. Vaccines  Your  health care provider may recommend certain vaccines, such as: Influenza vaccine. This is recommended every year. Tetanus, diphtheria, and acellular pertussis (Tdap, Td) vaccine. You may need a Td booster every 10 years. Zoster vaccine. You may need this after age 35. Pneumococcal 13-valent conjugate (PCV13) vaccine. One dose is recommended after age  71. Pneumococcal polysaccharide (PPSV23) vaccine. One dose is recommended after age 72. Talk to your health care provider about which screenings and vaccines you need and how often you need them. This information is not intended to replace advice given to you by your health care provider. Make sure you discuss any questions you have with your health care provider. Document Released: 09/28/2015 Document Revised: 05/21/2016 Document Reviewed: 07/03/2015 Elsevier Interactive Patient Education  2017 [employer] Prevention in the Home Falls can cause injuries. They can happen to people of all ages. There are many things you can do to make your home safe and to help prevent falls. What can I do on the outside of my home? Regularly fix the edges of walkways and driveways and fix any cracks. Remove anything that might make you trip as you walk through a door, such as a raised step or threshold. Trim any bushes or trees on the path to your home. Use bright outdoor lighting. Clear any walking paths of anything that might make someone trip, such as rocks or tools. Regularly check to see if handrails are loose or broken. Make sure that both sides of any steps have handrails. Any raised decks and porches should have guardrails on the edges. Have any leaves, snow, or ice cleared regularly. Use sand or salt on walking paths during winter. Clean up any spills in your garage right away. This includes oil or grease spills. What can I do in the bathroom? Use night lights. Install grab bars by the toilet and in the tub and shower. Do not use towel bars as grab bars. Use non-skid mats or decals in the tub or shower. If you need to sit down in the shower, use a plastic, non-slip stool. Keep the floor dry. Clean up any water that spills on the floor as soon as it happens. Remove soap buildup in the tub or shower regularly. Attach bath mats securely with double-sided non-slip rug tape. Do not have throw  rugs and other things on the floor that can make you trip. What can I do in the bedroom? Use night lights. Make sure that you have a light by your bed that is easy to reach. Do not use any sheets or blankets that are too big for your bed. They should not hang down onto the floor. Have a firm chair that has side arms. You can use this for support while you get dressed. Do not have throw rugs and other things on the floor that can make you trip. What can I do in the kitchen? Clean up any spills right away. Avoid walking on wet floors. Keep items that you use a lot in easy-to-reach places. If you need to reach something above you, use a strong step stool that has a grab bar. Keep electrical cords out of the way. Do not use floor polish or wax that makes floors slippery. If you must use wax, use non-skid floor wax. Do not have throw rugs and other things on the floor that can make you trip. What can I do with my stairs? Do not leave any items on the stairs. Make sure that there are handrails  on both sides of the stairs and use them. Fix handrails that are broken or loose. Make sure that handrails are as long as the stairways. Check any carpeting to make sure that it is firmly attached to the stairs. Fix any carpet that is loose or worn. Avoid having throw rugs at the top or bottom of the stairs. If you do have throw rugs, attach them to the floor with carpet tape. Make sure that you have a light switch at the top of the stairs and the bottom of the stairs. If you do not have them, ask someone to add them for you. What else can I do to help prevent falls? Wear shoes that: Do not have high heels. Have rubber bottoms. Are comfortable and fit you well. Are closed at the toe. Do not wear sandals. If you use a stepladder: Make sure that it is fully opened. Do not climb a closed stepladder. Make sure that both sides of the stepladder are locked into place. Ask someone to hold it for you, if  possible. Clearly mark and make sure that you can see: Any grab bars or handrails. First and last steps. Where the edge of each step is. Use tools that help you move around (mobility aids) if they are needed. These include: Canes. Walkers. Scooters. Crutches. Turn on the lights when you go into a dark area. Replace any light bulbs as soon as they burn out. Set up your furniture so you have a clear path. Avoid moving your furniture around. If any of your floors are uneven, fix them. If there are any pets around you, be aware of where they are. Review your medicines with your doctor. Some medicines can make you feel dizzy. This can increase your chance of falling. Ask your doctor what other things that you can do to help prevent falls. This information is not intended to replace advice given to you by your health care provider. Make sure you discuss any questions you have with your health care provider. Document Released: 06/28/2009 Document Revised: 02/07/2016 Document Reviewed: 10/06/2014 Elsevier Interactive Patient Education  2017 [employer].

## 2022-08-04 NOTE — Progress Notes (Signed)
Subjective:   Brenda Lara is a 71 y.o. female who presents for Medicare Annual (Subsequent) preventive examination.  I connected with  Larena Glassman on 08/04/22 by a audio enabled telemedicine application and verified that I am speaking with the correct person using two identifiers.  Patient Location: Home  Provider Location: Office/Clinic  I discussed the limitations of evaluation and management by telemedicine. The patient expressed understanding and agreed to proceed.   Review of Systems    Defer to PCP Cardiac Risk Factors include: advanced age (>20mn, >>17women);hypertension;dyslipidemia     Objective:    There were no vitals filed for this visit. There is no height or weight on file to calculate BMI.     08/04/2022    8:22 AM 08/02/2021    9:08 AM 01/19/2020    8:05 AM 01/17/2019    8:17 AM 03/08/2014    3:15 PM 01/12/2014    7:48 AM  Advanced Directives  Does Patient Have a Medical Advance Directive? Yes No No No Patient does not have advance directive Patient does not have advance directive  Type of [place]ive [place]       Does patient want to make changes to medical advance directive? No - Patient declined  No - Patient declined     Copy of HConcordin Chart? No - copy requested       Would patient like information on creating a medical advance directive?  No - Patient declined  Yes (MAU/Ambulatory/Procedural Areas - Information given)    Pre-existing out of facility DNR order (yellow form or pink MOST form)      No    Current Medications (verified) Outpatient Encounter Medications as of 08/04/2022  Medication Sig   aspirin EC (ASPIRIN LOW DOSE) 81 MG tablet TAKE 1 TABLET (81 MG TOTAL) BY MOUTH DAILY. SWALLOW WHOLE.   atenolol (TENORMIN) 50 MG tablet TAKE 1 TABLET BY MOUTH TWICE A DAY   atorvastatin (LIPITOR) 40 MG tablet TAKE 1 TABLET BY MOUTH EVERY DAY   Cholecalciferol (VITAMIN D3) 50 MCG (2000  UT) CAPS Take 1 capsule by mouth every 7 (seven) days.   meclizine (ANTIVERT) 25 MG tablet Take 1 tablet (25 mg total) by mouth 3 (three) times daily as needed for dizziness.   Multiple Vitamin (MULTIVITAMIN) tablet Take 1 tablet by mouth daily.   nitroGLYCERIN (NITROSTAT) 0.4 MG SL tablet Place 1 tablet (0.4 mg total) under the tongue every 5 (five) minutes as needed for chest pain.   omeprazole (PRILOSEC) 40 MG capsule Take 1 capsule (40 mg total) by mouth daily.   topiramate (TOPAMAX) 100 MG tablet Take 100 mg by mouth daily.    No facility-administered encounter medications on file as of 08/04/2022.    Allergies (verified) Patient has no known allergies.   History: Past Medical History:  Diagnosis Date   Allergy    Anemia    Angina pectoris    Anxiety    Cataract    Cervical cancer screening 01/31/2016   Menarche at 12 Regular and moderate flow No history of abnormal pap in past G1P1, s/p 1 SVD No history of abnormal MGM No concerns today gyn surgeries b/l tubal ligation and b/y oophorectomy   Colon cancer ([place] 2010   Coronary artery disease 2001/2005   S/P stenting to RCA and LAD   GERD (gastroesophageal reflux disease)    Hearing loss 08/05/2015   History of chicken pox  HTN (hypertension)    Menopause    Preventative health care 01/31/2016   Pure hypercholesterolemia    Vitamin D deficiency 06/01/2017   Past Surgical History:  Procedure Laterality Date   APPENDECTOMY  02/21/2009   BACK SURGERY  1978, 1988   multiple   BILATERAL OOPHORECTOMY  09/15/2008   CHOLECYSTECTOMY  07/16/2008   COLON SURGERY  09/15/2008   right colectomy   COLONOSCOPY  01/26/2014   NORMAL    CORONARY STENT PLACEMENT  2001/2005   RCA   CYSTECTOMY     left ovary   MELANOMA EXCISION Right 03/05/2022   right lower leg lesion removed   Family History  Problem Relation Age of Onset   Colon polyps Mother    Hypertension Mother    Transient ischemic attack Mother    Heart attack Mother         age 43   Heart disease Mother    Leukemia Mother    Heart attack Father        43's   Heart disease Father    Alcohol abuse Father    Arthritis Father        mva   Prostate cancer Father    Thyroid disease Daughter    Coronary artery disease Other    Colon cancer Neg Hx    Pancreatic cancer Neg Hx    Rectal cancer Neg Hx    Stomach cancer Neg Hx    Esophageal cancer Neg Hx    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, no dietary restrictions  Other Topics Concern   Not on file  Social History Narrative   Not on file   Social Determinants of Health   Financial Resource Strain: Low Risk  (08/02/2021)   Overall Financial Resource Strain (CARDIA)    Difficulty of Paying Living Expenses: Not hard at all  Food Insecurity: No Food Insecurity (08/04/2022)   Hunger Vital Sign    Worried About Running Out of Food in the Last Year: Never true    Ran Out of Food in the Last Year: Never true  Transportation Needs: No Transportation Needs (08/04/2022)   PRAPARE - Hydrologist (Medical): No    Lack of Transportation (Non-Medical): No  Physical Activity: Inactive (08/02/2021)   Exercise Vital Sign    Days of Exercise per Week: 0 days    Minutes of Exercise per Session: 0 min  Stress: No Stress Concern Present (08/02/2021)   [place]    Feeling of Stress : Only a little  Social Connections: Socially  Integrated (08/02/2021)   Social Connection and Isolation Panel [NHANES]    Frequency of Communication with Friends and Family: More than three times a week    Frequency of Social Gatherings with Friends and Family: More than three times a week    Attends Religious  Services: More than 4 times per year    Active Member of Clubs or Organizations: Yes    Attends Archivist Meetings: More than 4 times per year    Marital Status: Married    Tobacco Counseling Counseling given: Not Answered   Clinical Intake:  Pre-visit preparation completed: Yes  Pain : No/denies pain  Diabetes: No  How often do you need to have someone help you when you read instructions, pamphlets, or other written materials from your doctor or pharmacy?: 1 - Never  Activities of Daily Living    08/04/2022    8:24 AM  In your present state of health, do you have any difficulty performing the following activities:  Hearing? 0  Vision? 1  Comment cataract in right eye  Difficulty concentrating or making decisions? 0  Walking or climbing stairs? 0  Dressing or bathing? 0  Doing errands, shopping? 0  Preparing Food and eating ? N  Using the Toilet? N  In the past six months, have you accidently leaked urine? N  Do you have problems with loss of bowel control? N  Managing your Medications? N  Managing your Finances? N  Housekeeping or managing your Housekeeping? N    Patient Care Team: Mosie Lukes, MD as PCP - General (Family Medicine) Josue Hector, MD as PCP - Cardiology (Cardiology) Ladell Pier, MD as Consulting Physician (Oncology)  Indicate any recent Medical Services you may have received from other than [facility] providers in the past year (date may be approximate).     Assessment:   This is a routine wellness examination for [place].  Hearing/Vision screen No results found.  Dietary issues and exercise activities discussed: Current Exercise Habits: Home exercise routine, Type  of exercise: Other - see comments (chair exercises), Time (Minutes): 60, Frequency (Times/Week): 1, Weekly Exercise (Minutes/Week): 60, Intensity: Mild, Exercise limited by: None identified   Goals Addressed   None    Depression Screen    08/04/2022    8:23 AM 08/02/2021    9:12 AM 04/01/2021    3:07 PM 01/19/2020    8:08 AM 01/17/2019    8:19 AM 06/01/2017    2:28 PM  PHQ 2/9 Scores  PHQ - 2 Score 0 0 0 0 0 0    Fall Risk    08/04/2022    8:23 AM 08/02/2021    9:11 AM 04/01/2021    3:07 PM 01/19/2020    8:07 AM 01/17/2019    8:19 AM  [place] in the past year? 0 0 0 0 0  Number falls in past yr: 0 0 0 0   Injury with Fall? 0 0 0 0   Risk for fall due to : No Fall Risks  No Fall Risks    Follow up Falls evaluation completed Falls prevention discussed Falls evaluation completed      [place]:  Any stairs in or around the home? No  If so, are there any without handrails? No  Home free of loose throw rugs in walkways, pet beds, electrical cords, etc? Yes  Adequate lighting in your home to reduce risk of falls? Yes   ASSISTIVE DEVICES UTILIZED TO PREVENT FALLS:  Life alert? No  Use of a cane, walker or w/c? No  Grab bars in the bathroom? No  Shower chair or bench in shower? Yes  Elevated toilet seat or a handicapped toilet? Yes   TIMED UP AND GO:  Was the test performed?  No, audio visit .   Cognitive Function:        08/04/2022    8:27 AM  6CIT Screen  What Year? 0 points  What month? 0 points  What time? 0 points  Count back from 20 0 points  Months in reverse 0 points  Repeat phrase 0 points  Total Score 0 points    Immunizations Immunization History  Administered Date(s) Administered   Fluad Quad(high Dose 65+) 06/17/2019, 06/04/2022   Influenza Split 06/27/2011   Influenza, High Dose Seasonal PF 06/17/2018, 06/27/2020   Influenza-Unspecified 06/17/2017, 08/13/2021   Moderna Sars-Covid-2 Vaccination  10/29/2019, 11/26/2019   Pfizer Covid-19 Vaccine Bivalent Booster 77yr & up 06/11/2021   Pneumococcal Conjugate-13 06/01/2017   Pneumococcal Polysaccharide-23 06/17/2019   Tdap 07/02/2011    TDAP status: Up to date  Flu Vaccine status: Up to date  Pneumococcal vaccine status: Up to date  Covid-19 vaccine status: Information provided on how to obtain vaccines.   Qualifies for Shingles Vaccine? Yes   Zostavax completed No   Shingrix Completed?: No.    Education has been provided regarding the importance of this vaccine. Patient has been advised to call insurance company to determine out of pocket expense if they have not yet received this vaccine. Advised may also receive vaccine at local pharmacy or Health Dept. Verbalized acceptance and understanding.  Screening Tests Health Maintenance  Topic Date Due   COVID-19 Vaccine (4 - Mixed Product risk series) 08/06/2021   Medicare Annual Wellness (AWV)  08/02/2022   Zoster Vaccines- Shingrix (2 of 2) 09/15/2022   MAMMOGRAM  05/08/2023   COLONOSCOPY (Pts 45-489yrInsurance coverage will need to be confirmed)  07/01/2026   Pneumonia Vaccine 6538Years old  Completed   INFLUENZA VACCINE  Completed   DEXA SCAN  Completed   Hepatitis C Screening  Completed   HPV VACCINES  Aged Out    Health Maintenance  Health Maintenance Due  Topic Date Due   COVID-19 Vaccine (4 - Mixed Product risk series) 08/06/2021   Medicare Annual Wellness (AWV)  08/02/2022    Colorectal cancer screening: Type of screening: Colonoscopy. Completed 07/01/21. Repeat every 5 years  Mammogram status: Completed 05/07/22. Repeat every year  Bone Density status: Completed 02/02/20. Results reflect: Bone density results: OSTEOPOROSIS. Repeat every 2 years.  Lung Cancer Screening: (Low Dose CT Chest recommended if Age 71-80ears, 30 pack-year currently smoking OR have quit w/in 15years.) does not qualify.    Additional Screening:  Hepatitis C Screening: does  qualify; Completed 04/01/21  Vision Screening: Recommended annual ophthalmology exams for early detection of glaucoma and other disorders of the eye. Is the patient up to date with their annual eye exam?  Yes  Who is the provider or what is the name of the office in which the patient attends annual eye exams? Dr. SnRenaldo Fiddlernd Dr. BeTommy Rainwaterf pt is not established with a provider, would they like to be referred to a provider to establish care? No .   Dental Screening: Recommended annual dental exams for proper oral hygiene  Community Resource Referral / Chronic Care Management: CRR required this visit?  No   CCM required this visit?  No      Plan:     I have personally reviewed and noted the following in the patient's chart:   Medical and social history Use of alcohol, tobacco or illicit drugs  Current medications and supplements including opioid prescriptions. Patient is not currently taking  opioid prescriptions. Functional ability and status Nutritional status Physical activity Advanced directives List of other physicians Hospitalizations, surgeries, and ER visits in previous 12 months Vitals Screenings to include cognitive, depression, and falls Referrals and appointments  In addition, I have reviewed and discussed with patient certain preventive protocols, quality metrics, and best practice recommendations. A written personalized care plan for preventive services as well as general preventive health recommendations were provided to patient.   Due to this being a telephonic visit, the after visit summary with patients personalized plan was offered to patient via mail or my-chart.  Patient would like to access on my-chart.  Beatris Ship, [place]   08/04/2022   Nurse Notes: None

## 2022-08-11 DIAGNOSIS — D224 Melanocytic nevi of scalp and neck: Secondary | ICD-10-CM | POA: Diagnosis not present

## 2022-08-11 DIAGNOSIS — L821 Other seborrheic keratosis: Secondary | ICD-10-CM | POA: Diagnosis not present

## 2022-08-11 DIAGNOSIS — D2261 Melanocytic nevi of right upper limb, including shoulder: Secondary | ICD-10-CM | POA: Diagnosis not present

## 2022-08-11 DIAGNOSIS — L820 Inflamed seborrheic keratosis: Secondary | ICD-10-CM | POA: Diagnosis not present

## 2022-08-11 DIAGNOSIS — D1801 Hemangioma of skin and subcutaneous tissue: Secondary | ICD-10-CM | POA: Diagnosis not present

## 2022-08-11 DIAGNOSIS — D225 Melanocytic nevi of trunk: Secondary | ICD-10-CM | POA: Diagnosis not present

## 2022-08-11 DIAGNOSIS — D235 Other benign neoplasm of skin of trunk: Secondary | ICD-10-CM | POA: Diagnosis not present

## 2022-08-11 DIAGNOSIS — Z85820 Personal history of malignant melanoma of skin: Secondary | ICD-10-CM | POA: Diagnosis not present

## 2022-08-11 DIAGNOSIS — D485 Neoplasm of uncertain behavior of skin: Secondary | ICD-10-CM | POA: Diagnosis not present

## 2022-08-26 DIAGNOSIS — Z961 Presence of intraocular lens: Secondary | ICD-10-CM | POA: Diagnosis not present

## 2022-08-26 DIAGNOSIS — H25011 Cortical age-related cataract, right eye: Secondary | ICD-10-CM | POA: Diagnosis not present

## 2022-08-26 DIAGNOSIS — H25041 Posterior subcapsular polar age-related cataract, right eye: Secondary | ICD-10-CM | POA: Diagnosis not present

## 2022-08-26 DIAGNOSIS — H2511 Age-related nuclear cataract, right eye: Secondary | ICD-10-CM | POA: Diagnosis not present

## 2022-09-13 ENCOUNTER — Emergency Department (HOSPITAL_BASED_OUTPATIENT_CLINIC_OR_DEPARTMENT_OTHER)
Admission: EM | Admit: 2022-09-13 | Discharge: 2022-09-13 | Disposition: A | Discharge status: Home / Self Care | Payer: Medicare Other | Attending: Emergency Medicine | Admitting: Emergency Medicine

## 2022-09-13 ENCOUNTER — Encounter (HOSPITAL_BASED_OUTPATIENT_CLINIC_OR_DEPARTMENT_OTHER): Payer: Self-pay | Admitting: Emergency Medicine

## 2022-09-13 ENCOUNTER — Emergency Department (HOSPITAL_BASED_OUTPATIENT_CLINIC_OR_DEPARTMENT_OTHER): Payer: Medicare Other

## 2022-09-13 ENCOUNTER — Other Ambulatory Visit: Payer: Self-pay

## 2022-09-13 DIAGNOSIS — Z7982 Long term (current) use of aspirin: Secondary | ICD-10-CM | POA: Insufficient documentation

## 2022-09-13 DIAGNOSIS — I2510 Atherosclerotic heart disease of native coronary artery without angina pectoris: Secondary | ICD-10-CM | POA: Insufficient documentation

## 2022-09-13 DIAGNOSIS — W228XXA Striking against or struck by other objects, initial encounter: Secondary | ICD-10-CM | POA: Insufficient documentation

## 2022-09-13 DIAGNOSIS — S0990XA Unspecified injury of head, initial encounter: Secondary | ICD-10-CM

## 2022-09-13 DIAGNOSIS — S060X0A Concussion without loss of consciousness, initial encounter: Secondary | ICD-10-CM | POA: Diagnosis not present

## 2022-09-13 NOTE — ED Triage Notes (Addendum)
Pt hit head on a rafter in garage on Thurs; no LOC; she has had nausea, HA w/ light sensitivity since; she is scheduled for cataract surg on Wed and wanted to make sure everything was ok; takes baby ASA

## 2022-09-13 NOTE — ED Provider Notes (Signed)
[facility] Provider Note   CSN: [id number] Arrival date & time: 09/13/22  1223     History  Chief Complaint  Patient presents with   Head Injury    GEARLDENE FIORENZA is a 71 y.o. female.  Patient is a 70 year old female past medical history of CAD on aspirin presenting to the emergency department with headache.  Patient states that on Thursday he she was putting away Christmas decorations in the attic and hit her head on one of the rafters.  She denies any loss of consciousness.  She states that she initially had a large goose egg on top of her head but had no break in the skin.  She states that she was icing her head ever since then she has continued to have headaches with some nausea and lightheadedness.  She denies any vomiting, numbness or weakness.  She denies any vision changes.  She states that she is scheduled for cataracts on Wednesday and wanted to ensure that she is still safe to undergo surgery.  The history is provided by the patient and the spouse.  Head Injury      Home Medications Prior to Admission medications   Medication Sig Start Date End Date Taking? Authorizing Provider  aspirin EC (ASPIRIN LOW DOSE) 81 MG tablet TAKE 1 TABLET (81 MG TOTAL) BY MOUTH DAILY. SWALLOW WHOLE. 07/18/22   Josue Hector, MD  atenolol (TENORMIN) 50 MG tablet TAKE 1 TABLET BY MOUTH TWICE A DAY 06/02/22   Mosie Lukes, MD  atorvastatin (LIPITOR) 40 MG tablet TAKE 1 TABLET BY MOUTH EVERY DAY 07/15/22   Josue Hector, MD  Cholecalciferol (VITAMIN D3) 50 MCG (2000 UT) CAPS Take 1 capsule by mouth every 7 (seven) days.    [provider]  meclizine (ANTIVERT) 25 MG tablet Take 1 tablet (25 mg total) by mouth 3 (three) times daily as needed for dizziness. 12/29/17   Mosie Lukes, MD  Multiple Vitamin (MULTIVITAMIN) tablet Take 1 tablet by mouth daily.    [provider]  nitroGLYCERIN (NITROSTAT) 0.4 MG SL tablet Place 1 tablet (0.4 mg  total) under the tongue every 5 (five) minutes as needed for chest pain. 06/25/21   Josue Hector, MD  omeprazole (PRILOSEC) 40 MG capsule Take 1 capsule (40 mg total) by mouth daily. 07/28/22   Mosie Lukes, MD  topiramate (TOPAMAX) 100 MG tablet Take 100 mg by mouth daily.  02/08/13   [provider]      Allergies    Patient has no known allergies.    Review of Systems   Review of Systems  Physical Exam Updated Vital Signs BP (!) 172/80 (BP Location: Right Arm)   Pulse (!) 58   Temp 97.9 F (36.6 C) (Oral)   Resp 18   Ht '5\' 1"'$  (1.549 m)   Wt 62.1 kg   SpO2 100%   BMI 25.89 kg/m  Physical Exam Vitals and nursing note reviewed.  Constitutional:      General: She is not in acute distress.    Appearance: Normal appearance.  HENT:     Head: Normocephalic and atraumatic.     Nose: Nose normal.     Mouth/Throat:     Mouth: Mucous membranes are moist.     Pharynx: Oropharynx is clear.  Eyes:     Extraocular Movements: Extraocular movements intact.     Conjunctiva/sclera: Conjunctivae normal.     Pupils: Pupils are equal, round, and reactive  to light.  Neck:     Comments: No midline neck tenderness Cardiovascular:     Rate and Rhythm: Normal rate and regular rhythm.     Heart sounds: Normal heart sounds.  Pulmonary:     Effort: Pulmonary effort is normal.     Breath sounds: Normal breath sounds.  Abdominal:     General: Abdomen is flat.     Palpations: Abdomen is soft.     Tenderness: There is no abdominal tenderness.  Musculoskeletal:        General: Normal range of motion.     Cervical back: Normal range of motion and neck supple.  Skin:    General: Skin is warm and dry.  Neurological:     General: No focal deficit present.     Mental Status: She is alert and oriented to person, place, and time.     Cranial Nerves: No cranial nerve deficit.     Sensory: No sensory deficit.     Motor: No weakness.     Coordination: Coordination normal.   Psychiatric:        Mood and Affect: Mood normal.        Behavior: Behavior normal.     ED Results / Procedures / Treatments   Labs (all labs ordered are listed, but only abnormal results are displayed) Labs Reviewed - No data to display  EKG None  Radiology CT Head Wo Contrast  Result Date: 09/13/2022 CLINICAL DATA:  Head trauma. EXAM: CT HEAD WITHOUT CONTRAST TECHNIQUE: Contiguous axial images were obtained from the base of the skull through the vertex without intravenous contrast. RADIATION DOSE REDUCTION: This exam was performed according to the departmental dose-optimization program which includes automated exposure control, adjustment of the mA and/or kV according to patient size and/or use of iterative reconstruction technique. COMPARISON:  05/05/2011 FINDINGS: Brain: There is periventricular white matter decreased attenuation consistent with small vessel ischemic changes. Gray-white differentiation is preserved. No acute intracranial hemorrhage, mass effect or shift. No hydrocephalus. Vascular: No hyperdense vessel or unexpected calcification. Skull: Normal. Negative for fracture or focal lesion. Sinuses/Orbits: No acute finding. IMPRESSION: Periventricular white matter changes consistent with chronic small vessel ischemia. No acute intracranial process identified. Electronically Signed   By: Sammie Bench M.D.   On: 09/13/2022 12:47    Procedures Procedures    Medications Ordered in ED Medications - No data to display  ED Course/ Medical Decision Making/ A&P                           Medical Decision Making This patient presents to the ED with chief complaint(s) of headache with pertinent past medical history of CAD on aspirin which further complicates the presenting complaint. The complaint involves an extensive differential diagnosis and also carries with it a high risk of complications and morbidity.    The differential diagnosis includes due to patient's age on  aspirin, considering ICH or mass effect, she has no midline neck tenderness, full neck ROM and no neurologic deficits making cervical spine fracture unlikely, considering concussion, tension headache  Additional history obtained: Additional history obtained from spouse Records reviewed N/A  ED Course and Reassessment: Patient was initially evaluated by triage and had head CT performed that showed no acute intracranial abnormalities.  The patient's neurologic exam is within normal range.  She likely has a mild concussion and was recommended continue Tylenol as needed for her headaches.  She is medically cleared to undergo her  cataract surgeries and was recommended close primary care follow-up.  She was given strict return precautions.  Independent labs interpretation:  N/A  Independent visualization of imaging: - I independently visualized the following imaging with scope of interpretation limited to determining acute life threatening conditions related to emergency care: CTH, which revealed no acute intracranial abnormalities  Consultation: - Consulted or discussed management/test interpretation w/ external professional: N/A  Consideration for admission or further workup: Patient has no emergent conditions requiring admission or further work-up at this time and is stable for discharge home with primary care follow-up  Social Determinants of health: N/A    Amount and/or Complexity of Data Reviewed Radiology: ordered.          Final Clinical Impression(s) / ED Diagnoses Final diagnoses:  Injury of head, initial encounter  Concussion without loss of consciousness, initial encounter    Rx / DC Orders ED Discharge Orders     None         Kemper Durie, DO 09/13/22 1449

## 2022-09-13 NOTE — Discharge Instructions (Signed)
You were seen in the emergency department after your head injury.  Your head CT scan showed no abnormalities within your brain.  You likely have a mild concussion and you can continue to take Tylenol as needed for your headaches.  You should be safe to undergo your cataract surgery later this week.  You should follow-up with your primary doctor regarding your headache and dizziness symptoms and occasionally concussion symptoms can be prolonged and you may need specialized concussion treatment.  You should return to the emergency department if you are having repetitive vomiting, numbness or weakness on one side of the body compared to the other, your headaches are getting significantly worse or if you have any other new or concerning symptoms.

## 2022-09-15 HISTORY — PX: EYE SURGERY: SHX253

## 2022-09-17 DIAGNOSIS — H2511 Age-related nuclear cataract, right eye: Secondary | ICD-10-CM | POA: Diagnosis not present

## 2022-09-25 ENCOUNTER — Ambulatory Visit (INDEPENDENT_AMBULATORY_CARE_PROVIDER_SITE_OTHER): Payer: Medicare Other | Admitting: Family Medicine

## 2022-09-25 ENCOUNTER — Encounter: Payer: Self-pay | Admitting: Family Medicine

## 2022-09-25 VITALS — BP 132/78 | HR 57 | Temp 97.5°F | Resp 16 | Ht 61.0 in | Wt 139.0 lb

## 2022-09-25 DIAGNOSIS — E7800 Pure hypercholesterolemia, unspecified: Secondary | ICD-10-CM

## 2022-09-25 DIAGNOSIS — R519 Headache, unspecified: Secondary | ICD-10-CM

## 2022-09-25 DIAGNOSIS — I10 Essential (primary) hypertension: Secondary | ICD-10-CM

## 2022-09-25 DIAGNOSIS — E559 Vitamin D deficiency, unspecified: Secondary | ICD-10-CM

## 2022-09-25 DIAGNOSIS — C189 Malignant neoplasm of colon, unspecified: Secondary | ICD-10-CM

## 2022-09-25 DIAGNOSIS — D649 Anemia, unspecified: Secondary | ICD-10-CM

## 2022-09-25 DIAGNOSIS — R197 Diarrhea, unspecified: Secondary | ICD-10-CM | POA: Diagnosis not present

## 2022-09-25 LAB — COMPREHENSIVE METABOLIC PANEL
ALT: 18 U/L (ref 0–35)
AST: 21 U/L (ref 0–37)
Albumin: 4.4 g/dL (ref 3.5–5.2)
Alkaline Phosphatase: 104 U/L (ref 39–117)
BUN: 15 mg/dL (ref 6–23)
CO2: 27 mEq/L (ref 19–32)
Calcium: 9.7 mg/dL (ref 8.4–10.5)
Chloride: 103 mEq/L (ref 96–112)
Creatinine, Ser: 0.77 mg/dL (ref 0.40–1.20)
GFR: 77.79 mL/min (ref >60.00)
Glucose, Bld: 98 mg/dL (ref 70–99)
Potassium: 4.5 mEq/L (ref 3.5–5.1)
Sodium: 137 mEq/L (ref 135–145)
Total Bilirubin: 0.9 mg/dL (ref 0.2–1.2)
Total Protein: 6.9 g/dL (ref 6.0–8.3)

## 2022-09-25 LAB — CBC WITH DIFFERENTIAL/PLATELET
Basophils Absolute: 0 10*3/uL (ref 0.0–0.1)
Basophils Relative: 0.3 % (ref 0.0–3.0)
Eosinophils Absolute: 0 10*3/uL (ref 0.0–0.7)
Eosinophils Relative: 0.5 % (ref 0.0–5.0)
HCT: 39.7 % (ref 36.0–46.0)
Hemoglobin: 13.3 g/dL (ref 12.0–15.0)
Lymphocytes Relative: 18.8 % (ref 12.0–46.0)
Lymphs Abs: 1.4 10*3/uL (ref 0.7–4.0)
MCHC: 33.6 g/dL (ref 30.0–36.0)
MCV: 85.6 fl (ref 78.0–100.0)
Monocytes Absolute: 0.6 10*3/uL (ref 0.1–1.0)
Monocytes Relative: 8.1 % (ref 3.0–12.0)
Neutro Abs: 5.2 10*3/uL (ref 1.4–7.7)
Neutrophils Relative %: 72.3 % (ref 43.0–77.0)
Platelets: 279 10*3/uL (ref 150.0–400.0)
RBC: 4.64 Mil/uL (ref 3.87–5.11)
RDW: 13.2 % (ref 11.5–15.5)
WBC: 7.2 10*3/uL (ref 4.0–10.5)

## 2022-09-25 LAB — LIPID PANEL
Cholesterol: 129 mg/dL (ref 0–200)
HDL: 61.5 mg/dL (ref >39.00)
LDL Cholesterol: 44 mg/dL (ref 0–99)
NonHDL: 67.88
Total CHOL/HDL Ratio: 2
Triglycerides: 117 mg/dL (ref 0.0–149.0)
VLDL: 23.4 mg/dL (ref 0.0–40.0)

## 2022-09-25 LAB — VITAMIN D 25 HYDROXY (VIT D DEFICIENCY, FRACTURES): VITD: 30.67 ng/mL (ref 30.00–100.00)

## 2022-09-25 LAB — TSH: TSH: 1.92 u[IU]/mL (ref 0.35–5.50)

## 2022-09-25 NOTE — Assessment & Plan Note (Signed)
Encourage heart healthy diet such as MIND or DASH diet, increase exercise, avoid trans fats, simple carbohydrates and processed foods, consider a krill or fish or flaxseed oil cap daily. Tolerating Atorvastatin 

## 2022-09-25 NOTE — Progress Notes (Signed)
Subjective:   By signing my name below, I, Kellie Simmering, attest that this documentation has been prepared under the direction and in the presence of Mosie Lukes, MD., 09/25/2022.     Patient ID: Brenda Lara, female    DOB: 09/30/1950, 72 y.o.   MRN: [id number]  Chief Complaint  Patient presents with   Follow-up    Follow up   HPI Patient is in today for an office visit. She denies CP/palpitations/SOB/HA/congestion/ fevers/ chills/GI or GU symptoms.  Baker's Cyst Patient is inquiring about a possible baker's cyst behind her left knee. Denies any pain or discomfort.  Cataracts Surgery (Right Eye) Patient underwent right eye cataracts surgery on 09/17/2022 and has a follow up appointment with her ophthalmologist today. Left eye cataracts surgery was completed in 2016. The procedure went well, but she complains that she is farsighted and cannot see well up close. She further reports having headaches since the procedure which she has been managing with Tylenol.   Head Injury  Patient was admitted to the ED on 09/13/2022 after hitting her head at home and sustaining a mild concussion. She visited the ED to ensure that she was okay to undergo cataracts surgery the following week. She sustained no serious injuries and is doing well during today's visit.  Supplements She continues to take vitamin D 2000 IU daily. Past Medical History:  Diagnosis Date   Allergy    Anemia    Angina pectoris    Anxiety    Cataract    Cervical cancer screening 01/31/2016   Menarche at 12 Regular and moderate flow No history of abnormal pap in past G1P1, s/p 1 SVD No history of abnormal MGM No concerns today gyn surgeries b/l tubal ligation and b/y oophorectomy   Colon cancer ([place]) 2010   Coronary artery disease 2001/2005   S/P stenting to RCA and LAD   GERD (gastroesophageal reflux disease)    Hearing loss 08/05/2015   History of chicken pox    HTN (hypertension)    Menopause    Preventative  health care 01/31/2016   Pure hypercholesterolemia    Vitamin D deficiency 06/01/2017    Past Surgical History:  Procedure Laterality Date   APPENDECTOMY  02/21/2009   BACK SURGERY  1978, 1988   multiple   BILATERAL OOPHORECTOMY  09/15/2008   CHOLECYSTECTOMY  07/16/2008   COLON SURGERY  09/15/2008   right colectomy   COLONOSCOPY  01/26/2014   NORMAL    CORONARY STENT PLACEMENT  2001/2005   RCA   CYSTECTOMY     left ovary   EYE SURGERY Bilateral 2024   L cataract 2016, R 2024   MELANOMA EXCISION Right 03/05/2022   right lower leg lesion removed    Family History  Problem Relation Age of Onset   Colon polyps Mother    Hypertension Mother    Transient ischemic attack Mother    Heart attack Mother        age 60   Heart disease Mother    Leukemia Mother    Heart attack Father        71's   Heart disease Father    Alcohol abuse Father    Arthritis Father        mva   Prostate cancer Father    Thyroid disease Daughter    Coronary artery disease Other    Colon cancer Neg Hx    Pancreatic cancer Neg Hx    Rectal cancer Neg Hx  Stomach cancer Neg Hx    Esophageal cancer Neg Hx     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, no dietary restrictions  Other Topics Concern   Not on file  Social History Narrative   Not on file   Social Determinants of Health   Financial Resource Strain: [place]  (08/02/2021)   Overall Financial Resource Strain (CARDIA)    Difficulty of Paying Living  Expenses: Not hard at all  Food Insecurity: No Food Insecurity (08/04/2022)   Hunger Vital Sign    Worried About Running Out of Food in the Last Year: Never true    [place] in the Last Year: Never true  Transportation Needs: No Transportation Needs (08/04/2022)   PRAPARE - Hydrologist (Medical): No    Lack of Transportation (Non-Medical): No  Physical Activity: Inactive (08/02/2021)   Exercise Vital Sign    Days of Exercise per Week: 0 days    Minutes of Exercise per Session: 0 min  Stress: No Stress Concern Present (08/02/2021)   [place]    Feeling of Stress : Only a little  Social Connections: Socially Integrated (08/02/2021)   Social Connection and Isolation Panel [NHANES]    Frequency of Communication with Friends and Family: More than three times a week    Frequency of Social Gatherings with Friends and Family: More than three times a week    Attends Religious Services: More than 4 times per year    Active Member of [employer] or Organizations: Yes    Attends Music therapist: More than 4 times per year    Marital Status: Married  Human resources officer Violence: Not At Risk (08/04/2022)   Humiliation, Afraid, Rape, and Kick questionnaire    Fear of Current or Ex-Partner: No    Emotionally Abused: No    Physically Abused: No    Sexually Abused: No    Outpatient Medications Prior to Visit  Medication Sig Dispense Refill   aspirin EC (ASPIRIN LOW DOSE) 81 MG tablet TAKE 1 TABLET (81 MG TOTAL) BY MOUTH DAILY. SWALLOW WHOLE. 90 tablet 3   atenolol (TENORMIN) 50 MG tablet TAKE 1 TABLET BY MOUTH TWICE A DAY 180 tablet 1   atorvastatin (LIPITOR) 40 MG tablet TAKE 1 TABLET BY MOUTH EVERY DAY 90 tablet 1   Cholecalciferol (VITAMIN D3) 50 MCG (2000 UT) CAPS Take 1 capsule by mouth every 7 (seven) days.     meclizine (ANTIVERT) 25 MG tablet Take 1 tablet (25 mg total) by mouth 3  (three) times daily as needed for dizziness. 90 tablet 0   Multiple Vitamin (MULTIVITAMIN) tablet Take 1 tablet by mouth daily.     nitroGLYCERIN (NITROSTAT) 0.4 MG SL tablet Place 1 tablet (0.4 mg total) under the tongue every 5 (five) minutes as needed for chest pain. 25 tablet  3   omeprazole (PRILOSEC) 40 MG capsule Take 1 capsule (40 mg total) by mouth daily. 90 capsule 1   topiramate (TOPAMAX) 100 MG tablet Take 100 mg by mouth daily.      No facility-administered medications prior to visit.    No Known Allergies  Review of Systems  Constitutional:  Negative for chills and fever.  HENT:  Negative for congestion.   Respiratory:  Negative for shortness of breath.   Cardiovascular:  Negative for chest pain and palpitations.  Gastrointestinal:  Negative for abdominal pain, blood in stool, constipation, diarrhea, nausea and vomiting.  Genitourinary:  Negative for dysuria, frequency, hematuria and urgency.  Skin:           Neurological:  Negative for headaches.       Objective:    Physical Exam Constitutional:      General: She is not in acute distress.    Appearance: Normal appearance. She is normal weight. She is not ill-appearing.  HENT:     Head: Normocephalic and atraumatic.     Right Ear: External ear normal.     Left Ear: External ear normal.     Nose: Nose normal.     Mouth/Throat:     Mouth: Mucous membranes are moist.     Pharynx: Oropharynx is clear.  Eyes:     General:        Right eye: No discharge.        Left eye: No discharge.     Extraocular Movements: Extraocular movements intact.     Conjunctiva/sclera: Conjunctivae normal.     Pupils: Pupils are equal, round, and reactive to light.  Cardiovascular:     Rate and Rhythm: Normal rate and regular rhythm.     Pulses: Normal pulses.     Heart sounds: Normal heart sounds. No murmur heard.    No gallop.  Pulmonary:     Effort: Pulmonary effort is normal. No respiratory distress.     Breath sounds: Normal  breath sounds. No wheezing or rales.  Abdominal:     General: Bowel sounds are normal.     Palpations: Abdomen is soft.     Tenderness: There is no abdominal tenderness. There is no guarding.  Musculoskeletal:        General: Normal range of motion.     Cervical back: Normal range of motion.     Right lower leg: No edema.     Left lower leg: No edema.  Skin:    General: Skin is warm and dry.  Neurological:     Mental Status: She is alert and oriented to person, place, and time.  Psychiatric:        Mood and Affect: Mood normal.        Behavior: Behavior normal.        Judgment: Judgment normal.     BP 132/78 (BP Location: Right Arm, Patient Position: Sitting, Cuff Size: Normal)   Pulse (!) 57   Temp (!) 97.5 F (36.4 C) (Oral)   Resp 16   Ht '5\' 1"'$  (1.549 m)   Wt 139 lb (63 kg)   SpO2 99%   BMI 26.26 kg/m  Wt Readings from Last 3 Encounters:  09/25/22 139 lb (63 kg)  09/13/22 137 lb (62.1 kg)  07/10/22 139 lb (63 kg)    Diabetic Foot Exam - Simple   No data filed    Lab Results  Component Value Date   WBC 4.9 10/16/2021   HGB 12.3  10/16/2021   HCT 37.4 10/16/2021   PLT 241.0 10/16/2021   GLUCOSE 87 10/16/2021   CHOL 131 10/16/2021   TRIG 120.0 10/16/2021   HDL 55.40 10/16/2021   LDLCALC 52 10/16/2021   ALT 17 10/16/2021   AST 21 10/16/2021   NA 141 10/16/2021   K 4.2 10/16/2021   CL 103 10/16/2021   CREATININE 0.73 10/16/2021   BUN 16 10/16/2021   CO2 30 10/16/2021   TSH 3.17 10/16/2021   INR 0.95 05/13/2011   HGBA1C 5.6 10/16/2021    Lab Results  Component Value Date   TSH 3.17 10/16/2021   Lab Results  Component Value Date   WBC 4.9 10/16/2021   HGB 12.3 10/16/2021   HCT 37.4 10/16/2021   MCV 86.7 10/16/2021   PLT 241.0 10/16/2021   Lab Results  Component Value Date   NA 141 10/16/2021   K 4.2 10/16/2021   CO2 30 10/16/2021   GLUCOSE 87 10/16/2021   BUN 16 10/16/2021   CREATININE 0.73 10/16/2021   BILITOT 0.6 10/16/2021   ALKPHOS  94 10/16/2021   AST 21 10/16/2021   ALT 17 10/16/2021   PROT 6.7 10/16/2021   ALBUMIN 4.3 10/16/2021   CALCIUM 9.5 10/16/2021   GFR 83.48 10/16/2021   Lab Results  Component Value Date   CHOL 131 10/16/2021   Lab Results  Component Value Date   HDL 55.40 10/16/2021   Lab Results  Component Value Date   LDLCALC 52 10/16/2021   Lab Results  Component Value Date   TRIG 120.0 10/16/2021   Lab Results  Component Value Date   CHOLHDL 2 10/16/2021   Lab Results  Component Value Date   HGBA1C 5.6 10/16/2021      Assessment & Plan:  Headaches: Recommended moist heat, Tylenol and antiinflammatory topicals to manage patient's headaches.  Healthy Lifestyle: Encouraged heart healthy diet and hydration.  Immunizations: Reviewed patient's immunization history and encouraged RSV and Tetanus immunizations.   Labs: Routine blood work will be completed today. Problem List Items Addressed This Visit     HYPERCHOLESTEROLEMIA    Encourage heart healthy diet such as MIND or DASH diet, increase exercise, avoid trans fats, simple carbohydrates and processed foods, consider a krill or fish or flaxseed oil cap daily. Tolerating Atorvastatin      Relevant Orders   Lipid panel   Essential hypertension    Well controlled, no changes to meds. Encouraged heart healthy diet such as the DASH diet and exercise as tolerated.        Relevant Orders   CBC with Differential/Platelet   Comprehensive metabolic panel   TSH   Colon cancer ([place])    Doing well, bowels moving comfortable      Headache    Has flared after a head injury in December and cataract surgery but is improving again. Try tylenol bid for next 2 weeks and f/u with ophthalmology, Encouraged increased hydration, 64 ounces of clear fluids daily. Minimize alcohol and caffeine. Eat small frequent meals with lean proteins and complex carbs. Avoid high and low blood sugars. Get adequate sleep, 7-8 hours a night. Needs exercise daily  preferably in the morning. Report worsening symptoms      Anemia   Vitamin D deficiency - Primary    Supplement and monitor       Relevant Orders   VITAMIN D 25 Hydroxy (Vit-D Deficiency, Fractures)   Diarrhea    Resolved and bowels moving comfortably      No  orders of the defined types were placed in this encounter.  I, Penni Homans, MD, personally preformed the services described in this documentation.  All medical record entries made by the scribe were at my direction and in my presence.  I have reviewed the chart and discharge instructions (if applicable) and agree that the record reflects my personal performance and is accurate and complete. 09/25/2022  I,Mohammed Iqbal,acting as a scribe for Penni Homans, MD.,have documented all relevant documentation on the behalf of Penni Homans, MD,as directed by  Penni Homans, MD while in the presence of Penni Homans, MD.  Penni Homans, MD

## 2022-09-25 NOTE — Assessment & Plan Note (Signed)
Supplement and monitor 

## 2022-09-25 NOTE — Patient Instructions (Addendum)
Tetanus at pharmacy RSV, Respiratory Syncitial Virus, Arexvy at the pharmacy   [place] cyst, also called a popliteal cyst, is a growth that forms at the back of the knee. The cyst forms when the fluid-filled sac (bursa) that cushions the knee joint becomes enlarged. What are the causes? In most cases, a Baker cyst results from another knee problem that causes swelling inside the knee. This makes the fluid inside the knee joint (synovial fluid) flow into the bursa behind the knee, causing the bursa to enlarge. What increases the risk? You may be more likely to develop a Baker cyst if you already have a knee problem, such as: A tear in cartilage that cushions the knee joint (meniscal tear). A tear in the tissues that connect the bones of the knee joint (ligament tear). Knee swelling from osteoarthritis, rheumatoid arthritis, or gout. What are the signs or symptoms? The main symptom of this condition is a lump behind the knee. This may be the only symptom of the condition. The lump may be painful, especially when the knee is straightened. If the lump is painful, the pain may come and go. The knee may also be stiff. Symptoms may quickly get more severe if the cyst breaks open (ruptures). If the cyst ruptures, you may feel the following in your knee and calf: Sudden or worsening pain. Swelling. Bruising. Redness in the calf. A Baker cyst does not always cause symptoms. How is this diagnosed? This condition may be diagnosed based on your symptoms and medical history. Your health care provider will also do a physical exam. This may include: Feeling the cyst to check whether it is tender. Checking your knee for signs of another knee condition that causes swelling. You may have imaging tests, such as: X-rays. MRI. Ultrasound. How is this treated? A Baker cyst that is not painful may go away without treatment. If the cyst gets large or painful, it will likely get better if the  underlying knee problem is treated. If needed, treatment for a Baker cyst may include: Resting. Keeping weight off of the knee. This means not leaning on the knee to support your body weight. Taking NSAIDs, such as ibuprofen, to reduce pain and swelling. Having a procedure to drain the fluid from the cyst with a needle (aspiration). You may also get an injection of a medicine that reduces swelling (steroid). Having surgery. This may be needed if other treatments do not work. This usually involves correcting knee damage and removing the cyst. Follow these instructions at home:  Activity Rest as told by your health care provider. Avoid activities that make pain or swelling worse. Return to your normal activities as told by your health care provider. Ask your health care provider what activities are safe for you. Do not use the injured limb to support your body weight until your health care provider says that you can. Use crutches as told by your health care provider. General instructions Take over-the-counter and prescription medicines only as told by your health care provider. Keep all follow-up visits as told by your health care provider. This is important. Contact a health care provider if: You have knee pain, stiffness, or swelling that does not get better. Get help right away if: You have sudden or worsening pain and swelling in your calf area. Summary A Baker cyst, also called a popliteal cyst, is a growth that forms at the back of the knee. In most cases, a Baker cyst results from another  knee problem that causes swelling inside the knee. A Baker cyst that is not painful may go away without treatment. If needed, treatment for a Baker cyst may include resting, keeping weight off of the knee, medicines, or draining fluid from the cyst. Surgery may be needed if other treatments are not effective. This information is not intended to replace advice given to you by your health care  provider. Make sure you discuss any questions you have with your health care provider. Document Revised: 01/14/2019 Document Reviewed: 01/14/2019 Elsevier Patient Education  [place]. High Cholesterol  High cholesterol is a condition in which the blood has high levels of a white, waxy substance similar to fat (cholesterol). The liver makes all the cholesterol that the body needs. The human body needs small amounts of cholesterol to help build cells. A person gets extra or excess cholesterol from the food that he or she eats. The blood carries cholesterol from the liver to the rest of the body. If you have high cholesterol, deposits (plaques) may build up on the walls of your arteries. Arteries are the blood vessels that carry blood away from your heart. These plaques make the arteries narrow and stiff. Cholesterol plaques increase your risk for heart attack and stroke. Work with your health care provider to keep your cholesterol levels in a healthy range. What increases the risk? The following factors may make you more likely to develop this condition: Eating foods that are high in animal fat (saturated fat) or cholesterol. Being overweight. Not getting enough exercise. A family history of high cholesterol (familial hypercholesterolemia). Use of tobacco products. Having diabetes. What are the signs or symptoms? In most cases, high cholesterol does not usually cause any symptoms. In severe cases, very high cholesterol levels can cause: Fatty bumps under the skin (xanthomas). A white or gray ring around the black center (pupil) of the eye. How is this diagnosed? This condition may be diagnosed based on the results of a blood test. If you are older than 72 years of age, your health care provider may check your cholesterol levels every 4-6 years. You may be checked more often if you have high cholesterol or other risk factors for heart disease. The blood test for cholesterol  measures: "Bad" cholesterol, or LDL cholesterol. This is the main type of cholesterol that causes heart disease. The desired level is less than 100 mg/dL (2.59 mmol/L). "Good" cholesterol, or HDL cholesterol. HDL helps protect against heart disease by cleaning the arteries and carrying the LDL to the liver for processing. The desired level for HDL is 60 mg/dL (1.55 mmol/L) or higher. Triglycerides. These are fats that your body can store or burn for energy. The desired level is less than 150 mg/dL (1.69 mmol/L). Total cholesterol. This measures the total amount of cholesterol in your blood and includes LDL, HDL, and triglycerides. The desired level is less than 200 mg/dL (5.17 mmol/L). How is this treated? Treatment for high cholesterol starts with lifestyle changes, such as diet and exercise. Diet changes. You may be asked to eat foods that have more fiber and less saturated fats or added sugar. Lifestyle changes. These may include regular exercise, maintaining a healthy weight, and quitting use of tobacco products. Medicines. These are given when diet and lifestyle changes have not worked. You may be prescribed a statin medicine to help lower your cholesterol levels. Follow these instructions at home: Eating and drinking  Eat a healthy, balanced diet. This diet includes: Daily servings of a  variety of fresh, frozen, or canned fruits and vegetables. Daily servings of whole grain foods that are rich in fiber. Foods that are low in saturated fats and trans fats. These include poultry and fish without skin, lean cuts of meat, and low-fat dairy products. A variety of fish, especially oily fish that contain omega-3 fatty acids. Aim to eat fish at least 2 times a week. Avoid foods and drinks that have added sugar. Use healthy cooking methods, such as roasting, grilling, broiling, baking, poaching, steaming, and stir-frying. Do not fry your food except for stir-frying. If you drink alcohol: Limit how  much you have to: 0-1 drink a day for women who are not pregnant. 0-2 drinks a day for men. Know how much alcohol is in a drink. In the [place] one drink equals one 12 oz bottle of beer (355 mL), one 5 oz glass of wine (148 mL), or one 1 oz glass of hard liquor (44 mL). Lifestyle  Get regular exercise. Aim to exercise for a total of 150 minutes a week. Increase your activity level by doing activities such as gardening, walking, and taking the stairs. Do not use any products that contain nicotine or tobacco. These products include cigarettes, chewing tobacco, and vaping devices, such as e-cigarettes. If you need help quitting, ask your health care provider. General instructions Take over-the-counter and prescription medicines only as told by your health care provider. Keep all follow-up visits. This is important. Where to find more information American Heart Association: www.heart.org National Heart, Lung, and Blood Institute: https://wilson-eaton.com/ Contact a health care provider if: You have trouble achieving or maintaining a healthy diet or weight. You are starting an exercise program. You are unable to stop smoking. Get help right away if: You have chest pain. You have trouble breathing. You have discomfort or pain in your jaw, neck, back, shoulder, or arm. You have any symptoms of a stroke. "BE FAST" is an easy way to remember the main warning signs of a stroke: B - Balance. Signs are dizziness, sudden trouble walking, or loss of balance. E - Eyes. Signs are trouble seeing or a sudden change in vision. F - Face. Signs are sudden weakness or numbness of the face, or the face or eyelid drooping on one side. A - Arms. Signs are weakness or numbness in an arm. This happens suddenly and usually on one side of the body. S - Speech. Signs are sudden trouble speaking, slurred speech, or trouble understanding what people say. T - Time. Time to call emergency services. Write down what time symptoms  started. You have other signs of a stroke, such as: A sudden, severe headache with no known cause. Nausea or vomiting. Seizure. These symptoms may represent a serious problem that is an emergency. Do not wait to see if the symptoms will go away. Get medical help right away. Call your local emergency services (911 in the [place] Do not drive yourself to the hospital. Summary Cholesterol plaques increase your risk for heart attack and stroke. Work with your health care provider to keep your cholesterol levels in a healthy range. Eat a healthy, balanced diet, get regular exercise, and maintain a healthy weight. Do not use any products that contain nicotine or tobacco. These products include cigarettes, chewing tobacco, and vaping devices, such as e-cigarettes. Get help right away if you have any symptoms of a stroke. This information is not intended to replace advice given to you by your health care provider. Make sure you discuss any  questions you have with your health care provider. Document Revised: 04/04/2022 Document Reviewed: 11/05/2020 Elsevier Patient Education  [place].

## 2022-09-25 NOTE — Assessment & Plan Note (Signed)
Doing well, bowels moving comfortable

## 2022-09-25 NOTE — Assessment & Plan Note (Signed)
Resolved and bowels moving comfortably

## 2022-09-25 NOTE — Assessment & Plan Note (Signed)
Has flared after a head injury in December and cataract surgery but is improving again. Try tylenol bid for next 2 weeks and f/u with ophthalmology, Encouraged increased hydration, 64 ounces of clear fluids daily. Minimize alcohol and caffeine. Eat small frequent meals with lean proteins and complex carbs. Avoid high and low blood sugars. Get adequate sleep, 7-8 hours a night. Needs exercise daily preferably in the morning. Report worsening symptoms

## 2022-09-25 NOTE — Assessment & Plan Note (Signed)
Well controlled, no changes to meds. Encouraged heart healthy diet such as the DASH diet and exercise as tolerated.  °

## 2022-10-10 ENCOUNTER — Encounter: Payer: Self-pay | Admitting: Family Medicine

## 2022-11-20 ENCOUNTER — Other Ambulatory Visit: Payer: Self-pay | Admitting: Family Medicine

## 2022-11-24 DIAGNOSIS — G43719 Chronic migraine without aura, intractable, without status migrainosus: Secondary | ICD-10-CM | POA: Diagnosis not present

## 2022-12-10 ENCOUNTER — Ambulatory Visit: Payer: Medicare Other | Attending: Family Medicine

## 2022-12-10 DIAGNOSIS — I2510 Atherosclerotic heart disease of native coronary artery without angina pectoris: Secondary | ICD-10-CM

## 2022-12-10 DIAGNOSIS — E785 Hyperlipidemia, unspecified: Secondary | ICD-10-CM | POA: Diagnosis not present

## 2022-12-12 LAB — COMPREHENSIVE METABOLIC PANEL
ALT: 16 IU/L (ref 0–32)
AST: 21 IU/L (ref 0–40)
Albumin/Globulin Ratio: 1.8 (ref 1.2–2.2)
Albumin: 4.2 g/dL (ref 3.8–4.8)
Alkaline Phosphatase: 138 IU/L — ABNORMAL HIGH (ref 44–121)
BUN/Creatinine Ratio: 22 (ref 12–28)
BUN: 19 mg/dL (ref 8–27)
Bilirubin Total: 0.3 mg/dL (ref 0.0–1.2)
CO2: 21 mmol/L (ref 20–29)
Calcium: 9.5 mg/dL (ref 8.7–10.3)
Chloride: 106 mmol/L (ref 96–106)
Creatinine, Ser: 0.85 mg/dL (ref 0.57–1.00)
Globulin, Total: 2.4 g/dL (ref 1.5–4.5)
Glucose: 90 mg/dL (ref 70–99)
Potassium: 4.5 mmol/L (ref 3.5–5.2)
Sodium: 142 mmol/L (ref 134–144)
Total Protein: 6.6 g/dL (ref 6.0–8.5)
eGFR: 73 mL/min/{1.73_m2} (ref >59)

## 2022-12-12 LAB — LIPID PANEL
Chol/HDL Ratio: 2.2 ratio (ref 0.0–4.4)
Cholesterol, Total: 134 mg/dL (ref 100–199)
HDL: 60 mg/dL (ref >39)
LDL Chol Calc (NIH): 52 mg/dL (ref 0–99)
Triglycerides: 128 mg/dL (ref 0–149)
VLDL Cholesterol Cal: 22 mg/dL (ref 5–40)

## 2023-01-05 NOTE — Progress Notes (Signed)
Patient ID: Brenda Lara, female   DOB: [birth year], 72 y.o.   MRN: [id number]     72 y.o. with distant history of CAD. Stenting of RCA in 2006 and LAD in 2005. Last cath in 2010 stents widely patent Last ETT 07/10/20 was normal. Statin changed to Lipitor in February 2019 with target LDL reached. Colon cancer in remission post colectomy EF is 55% with some apical hypokinesis but has not had echo in long time.   No angina Compliant with meds  Anginal equivalent is inner arm pain  Varicosities Venous duplex LLE negative for DVT March 2019  Husband retired and home more Seems to cramp her life style.    Some chronic lower back pain had disc surgery in her 30's Xray 04/02/21 with L45 degenerative changes   Has had heme  positive stool 06/2021 with normal EGD and some diverticula and hemorrhoids on scopes by Dr Marina Goodell  Post head trauma 08/2022 and headaches with cataract surgery 09/17/22 CT head negative acute findings    ROS: Denies fever, malais, weight loss, blurry vision, decreased visual acuity, cough, sputum, SOB, hemoptysis, pleuritic pain, palpitaitons, heartburn, abdominal pain, melena, lower extremity edema, claudication, or rash.  All other systems reviewed and negative  General: BP 126/88   Pulse (!) 57   Ht 5\' 1"  (1.549 m)   Wt 138 lb 3.2 oz (62.7 kg)   SpO2 99%   BMI 26.11 kg/m  Affect appropriate Healthy:  appears stated age HEENT: normal Neck supple with no adenopathy JVP normal no bruits no thyromegaly Lungs clear with no wheezing and good diaphragmatic motion Heart:  S1/S2 no murmur, no rub, gallop or click PMI normal Abdomen: benighn, BS positve, no tenderness, no AAA post colectomy  no bruit.  No HSM or HJR Distal pulses intact with no bruits No edema Neuro non-focal Skin warm and dry No muscular weakness   Current Outpatient Medications  Medication Sig Dispense Refill   aspirin EC (ASPIRIN LOW DOSE) 81 MG tablet TAKE 1 TABLET (81 MG TOTAL) BY MOUTH DAILY.  SWALLOW WHOLE. 90 tablet 3   atenolol (TENORMIN) 50 MG tablet TAKE 1 TABLET BY MOUTH TWICE A DAY 180 tablet 1   atorvastatin (LIPITOR) 40 MG tablet TAKE 1 TABLET BY MOUTH EVERY DAY 90 tablet 1   Cholecalciferol (VITAMIN D3) 50 MCG (2000 UT) CAPS Take 1 capsule by mouth every 7 (seven) days.     meclizine (ANTIVERT) 25 MG tablet Take 1 tablet (25 mg total) by mouth 3 (three) times daily as needed for dizziness. 90 tablet 0   Multiple Vitamin (MULTIVITAMIN) tablet Take 1 tablet by mouth daily.     nitroGLYCERIN (NITROSTAT) 0.4 MG SL tablet Place 1 tablet (0.4 mg total) under the tongue every 5 (five) minutes as needed for chest pain. 25 tablet 3   omeprazole (PRILOSEC) 40 MG capsule Take 1 capsule (40 mg total) by mouth daily. 90 capsule 1   topiramate (TOPAMAX) 100 MG tablet Take 100 mg by mouth daily.      No current facility-administered medications for this visit.    Allergies  Patient has no known allergies.  Electrocardiogram:  06/21/20 SR rate 58 normal 01/12/2023 NSR normal   Assessment and Plan  CAD:  Distant history of RCA/LAD stenting  Patent by cath in 2010.  No angina, normal ECG ETT last 07/10/20 normal Continue medical RX  SL nitro refilled   Chol:  Changed to Lipitor 10/2017 with target LDL reached  Cholesterol is at goal.  Continue current dose of statin and diet Rx.  No myalgias or side effects.  F/U  LFT's in 6 months. Lab Results  Component Value Date   LDLCALC 52 12/10/2022   Colon Cancer:  benign colonsocopy 07/01/21 f/u Marina Goodell   GERD:  Continue zantac as needed Benign EGD 07/01/21   Vertigo:  Infrequent has antivert as needed   Myalgias:  LE;s pulses are normal  tolerating lipitor  F/U primary ? R/o lumbar stenosis   F/U in a year   Charlton Haws

## 2023-01-08 ENCOUNTER — Other Ambulatory Visit: Payer: Self-pay | Admitting: Cardiovascular Disease

## 2023-01-12 ENCOUNTER — Ambulatory Visit: Payer: Medicare Other | Attending: Cardiovascular Disease | Admitting: Cardiovascular Disease

## 2023-01-12 ENCOUNTER — Encounter: Payer: Self-pay | Admitting: Cardiovascular Disease

## 2023-01-12 VITALS — BP 126/88 | HR 57 | Ht 61.0 in | Wt 138.2 lb

## 2023-01-12 DIAGNOSIS — I10 Essential (primary) hypertension: Secondary | ICD-10-CM | POA: Diagnosis not present

## 2023-01-12 DIAGNOSIS — I2510 Atherosclerotic heart disease of native coronary artery without angina pectoris: Secondary | ICD-10-CM | POA: Diagnosis not present

## 2023-01-12 DIAGNOSIS — E785 Hyperlipidemia, unspecified: Secondary | ICD-10-CM

## 2023-01-12 MED ORDER — NITROGLYCERIN 0.4 MG SL SUBL: 0.4000 mg | SUBLINGUAL_TABLET | SUBLINGUAL | 3 refills | Status: AC | PRN

## 2023-01-12 NOTE — Patient Instructions (Signed)
Medication Instructions:  Your physician recommends that you continue on your current medications as directed. Please refer to the Current Medication list given to you today.  *If you need a refill on your cardiac medications before your next appointment, please call your pharmacy*  Lab Work: If you have labs (blood work) drawn today and your tests are completely normal, you will receive your results only by: MyChart Message (if you have MyChart) OR A paper copy in the mail If you have any lab test that is abnormal or we need to change your treatment, we will call you to review the results.  Testing/Procedures: None ordered today.  Follow-Up: At [facility], you and your health needs are our priority.  As part of our continuing mission to provide you with exceptional heart care, we have created designated Provider Care Teams.  These Care Teams include your primary Cardiologist (physician) and Advanced Practice Providers (APPs -  Physician Assistants and Nurse Practitioners) who all work together to provide you with the care you need, when you need it.  We recommend signing up for the patient portal called "MyChart".  Sign up information is provided on this After Visit Summary.  MyChart is used to connect with patients for Virtual Visits (Telemedicine).  Patients are able to view lab/test results, encounter notes, upcoming appointments, etc.  Non-urgent messages can be sent to your provider as well.   To learn more about what you can do with MyChart, go to [email or web address].    Your next appointment:   1 year(s)  Provider:   Peter Nishan, MD      

## 2023-01-19 ENCOUNTER — Other Ambulatory Visit: Payer: Self-pay | Admitting: Family Medicine

## 2023-03-29 NOTE — Assessment & Plan Note (Signed)
Supplement and monitor 

## 2023-03-29 NOTE — Assessment & Plan Note (Signed)
Patient encouraged to maintain heart healthy diet, regular exercise, adequate sleep. Consider daily probiotics. Take medications as prescribed. Labs ordered and reviewed. Last colonoscopy in June 2020 repeat in 2025. Last screening [facility] August 2023. She will consider a pap in the future. Dexa 2021 repeat in next year.

## 2023-03-29 NOTE — Assessment & Plan Note (Signed)
Hydrate and monitor 

## 2023-03-29 NOTE — Assessment & Plan Note (Signed)
Encourage heart healthy diet such as MIND or DASH diet, increase exercise, avoid trans fats, simple carbohydrates and processed foods, consider a krill or fish or flaxseed oil cap daily. Tolerating Atorvastatin 

## 2023-03-29 NOTE — Assessment & Plan Note (Signed)
Well controlled, no changes to meds. Encouraged heart healthy diet such as the DASH diet and exercise as tolerated.  °

## 2023-03-29 NOTE — Progress Notes (Signed)
Subjective:    Patient ID: Brenda Lara, female    DOB: [birth year], 71 y.o.   MRN: [id number]  Chief Complaint  Patient presents with  . Annual Exam    Annual Exam    HPI Discussed the use of AI scribe software for clinical note transcription with the patient, who gave verbal consent to proceed.  History of Present Illness   The patient, with a history of heart disease and skin cancer, presents with worsening back pain, particularly in the upper back and between the shoulder blades. The pain is described as a burning ache that is unrelieved by topical treatments. The pain is severe enough to disrupt sleep and daily activities, such as cooking. The patient also reports occasional chest pain and arm aches, similar to symptoms experienced prior to a previous heart blockage. The patient also reports occasional nausea and loss of appetite, occurring a couple of times a week without a clear pattern. The patient has a history of melanoma and precancerous skin conditions, and is due for a follow-up with dermatology in December.    Patient is a 72 yo female in today for annual preventative exam and follow up on chronic medical concerns. No recent febrile illness or hospitalizations. Denies CP/palp/SOB/HA/congestion/fevers/GI or GU c/o. Taking meds as prescribed     Past Medical History:  Diagnosis Date  . Allergy   . Anemia   . Angina pectoris   . Anxiety   . Cataract   . Cervical cancer screening 01/31/2016   Menarche at 12 Regular and moderate flow No history of abnormal pap in past G1P1, s/p 1 SVD No history of abnormal MGM No concerns today gyn surgeries b/l tubal ligation and b/y oophorectomy  . Colon cancer (HCC) 2010  . Coronary artery disease 2001/2005   S/P stenting to RCA and LAD  . GERD (gastroesophageal reflux disease)   . Hearing loss 08/05/2015  . History of chicken pox   . HTN (hypertension)   . Menopause   . Preventative health care 01/31/2016  . Pure  hypercholesterolemia   . Vitamin D deficiency 06/01/2017    Past Surgical History:  Procedure Laterality Date  . APPENDECTOMY  02/21/2009  . BACK SURGERY  1978, 1988   multiple  . BILATERAL OOPHORECTOMY  09/15/2008  . CHOLECYSTECTOMY  07/16/2008  . COLON SURGERY  09/15/2008   right colectomy  . COLONOSCOPY  01/26/2014   NORMAL   . CORONARY STENT PLACEMENT  2001/2005   RCA  . CYSTECTOMY     left ovary  . EYE SURGERY Bilateral 2024   L cataract 2016, R 2024  . MELANOMA EXCISION Right 03/05/2022   right lower leg lesion removed    Family History  Problem Relation Age of Onset  . Colon polyps Mother   . Hypertension Mother   . Transient ischemic attack Mother   . Heart attack Mother        age 55  . Heart disease Mother   . Leukemia Mother   . Heart attack Father        90's  . Heart disease Father   . Alcohol abuse Father   . Arthritis Father        mva  . Prostate cancer Father   . Thyroid disease Daughter   . Coronary artery disease Other   . Colon cancer Neg Hx   . Pancreatic cancer Neg Hx   . Rectal cancer Neg Hx   . Stomach cancer Neg Hx   .  Esophageal cancer Neg Hx     Social History   Socioeconomic History  . Marital status: Married    Spouse name: Not on file  . Number of children: Not on file  . Years of education: Not on file  . Highest education level: Not on file  Occupational History  . Not on file  Tobacco Use  . Smoking status: Never  . Smokeless tobacco: Never  Vaping Use  . Vaping status: Never Used  Substance and Sexual Activity  . Alcohol use: No  . Drug use: No  . 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 Not on file  Social History Narrative  . Not on file   Social Determinants of Health   Financial Resource Strain: Low Risk  (08/02/2021)   Overall Financial Resource Strain (CARDIA)   . Difficulty of  Paying Living Expenses: Not hard at all  Food Insecurity: No Food Insecurity (08/04/2022)   Hunger Vital Sign   . Worried About Programme researcher, broadcasting/film/video in the Last Year: Never true   . Ran Out of Food in the Last Year: Never true  Transportation Needs: No Transportation Needs (08/04/2022)   PRAPARE - Transportation   . Lack of Transportation (Medical): No   . Lack of Transportation (Non-Medical): No  Physical Activity: Inactive (08/02/2021)   Exercise Vital Sign   . Days of Exercise per Week: 0 days   . Minutes of Exercise per Session: 0 min  Stress: No Stress Concern Present (08/02/2021)   [employer] of Occupational Health - Occupational Stress Questionnaire   . Feeling of Stress : Only a little  Social Connections: Socially Integrated (08/02/2021)   Social Connection and Isolation Panel [NHANES]   . Frequency of Communication with Friends and Family: More than three times a week   . Frequency of Social Gatherings with Friends and Family: More than three times a week   . Attends Religious Services: More than 4 times per year   . Active Member of Clubs or Organizations: Yes   . Attends Banker Meetings: More than 4 times per year   . Marital Status: Married  Catering manager Violence: Not At Risk (08/04/2022)   Humiliation, Afraid, Rape, and Kick questionnaire   . Fear of Current or Ex-Partner: No   . Emotionally Abused: No   . Physically Abused: No   . Sexually Abused: No    Outpatient Medications Prior to Visit  Medication Sig Dispense Refill  . aspirin EC (ASPIRIN LOW DOSE) 81 MG tablet TAKE 1 TABLET (81 MG TOTAL) BY MOUTH DAILY. SWALLOW WHOLE. 90 tablet 3  . atorvastatin (LIPITOR) 40 MG tablet TAKE 1 TABLET BY MOUTH EVERY DAY 90 tablet 1  . Cholecalciferol (VITAMIN D3) 50 MCG (2000 UT) CAPS Take 1 capsule by mouth every 7 (seven) days.    . meclizine (ANTIVERT) 25 MG tablet Take 1 tablet (25 mg total) by mouth 3 (three) times daily as needed for dizziness. 90  tablet 0  . Multiple Vitamin (MULTIVITAMIN) tablet Take 1 tablet by mouth daily.    . nitroGLYCERIN (NITROSTAT) 0.4 MG SL tablet Place 1 tablet (0.4 mg total) under the tongue every 5 (five) minutes as needed for chest pain. 25 tablet 3  . omeprazole (PRILOSEC) 40 MG capsule TAKE 1 CAPSULE (40 MG TOTAL) BY MOUTH DAILY. 90 capsule 1  . topiramate (TOPAMAX) 100  MG tablet Take 100 mg by mouth daily.     Marland Kitchen atenolol (TENORMIN) 50 MG tablet TAKE 1 TABLET BY MOUTH TWICE A DAY 180 tablet 1   No facility-administered medications prior to visit.    No Known Allergies  Review of Systems  Constitutional:  Negative for fever and malaise/fatigue.  HENT:  Negative for congestion.   Eyes:  Negative for blurred vision.  Respiratory:  Negative for shortness of breath.   Cardiovascular:  Positive for chest pain. Negative for palpitations and leg swelling.  Gastrointestinal:  Negative for abdominal pain, blood in stool and nausea.  Genitourinary:  Negative for dysuria and frequency.  Musculoskeletal:  Positive for back pain. Negative for falls.  Skin:  Negative for rash.  Neurological:  Negative for dizziness, loss of consciousness and headaches.  Endo/Heme/Allergies:  Negative for environmental allergies.  Psychiatric/Behavioral:  Negative for depression. The patient is not nervous/anxious.        Objective:    Physical Exam Constitutional:      General: She is not in acute distress.    Appearance: Normal appearance. She is well-developed. She is not toxic-appearing.  HENT:     Head: Normocephalic and atraumatic.     Right Ear: External ear normal.     Left Ear: External ear normal.     Nose: Nose normal.  Eyes:     General:        Right eye: No discharge.        Left eye: No discharge.     Conjunctiva/sclera: Conjunctivae normal.  Neck:     Thyroid: No thyromegaly.  Cardiovascular:     Rate and Rhythm: Normal rate and regular rhythm.     Heart sounds: Normal heart sounds. No murmur  heard. Pulmonary:     Effort: Pulmonary effort is normal. No respiratory distress.     Breath sounds: Normal breath sounds.  Abdominal:     General: Bowel sounds are normal.     Palpations: Abdomen is soft.     Tenderness: There is no abdominal tenderness. There is no guarding.  Musculoskeletal:        General: Normal range of motion.     Cervical back: Neck supple.  Lymphadenopathy:     Cervical: No cervical adenopathy.  Skin:    General: Skin is warm and dry.  Neurological:     Mental Status: She is alert and oriented to person, place, and time.  Psychiatric:        Mood and Affect: Mood normal.        Behavior: Behavior normal.        Thought Content: Thought content normal.        Judgment: Judgment normal.   BP (!) 140/82 (BP Location: Left Arm, Patient Position: Sitting, Cuff Size: Normal)   Pulse 62   Temp (!) 97.5 F (36.4 C) (Oral)   Resp 16   Ht 5\' 2"  (1.575 m)   Wt 135 lb 3.2 oz (61.3 kg)   SpO2 97%   BMI 24.73 kg/m  Wt Readings from Last 3 Encounters:  03/31/23 134 lb (60.8 kg)  03/30/23 135 lb 3.2 oz (61.3 kg)  01/12/23 138 lb 3.2 oz (62.7 kg)    Diabetic Foot Exam - Simple   No data filed    Lab Results  Component Value Date   WBC 6.0 03/30/2023   HGB 12.9 03/30/2023   HCT 39.6 03/30/2023   PLT 287.0 03/30/2023   GLUCOSE 100 (H) 03/30/2023  CHOL 130 03/30/2023   TRIG 111.0 03/30/2023   HDL 58.20 03/30/2023   LDLCALC 50 03/30/2023   ALT 24 03/30/2023   AST 25 03/30/2023   NA 136 03/30/2023   K 4.2 03/30/2023   CL 104 03/30/2023   CREATININE 0.84 03/30/2023   BUN 16 03/30/2023   CO2 25 03/30/2023   TSH 2.26 03/30/2023   INR 0.95 05/13/2011   HGBA1C 6.0 03/30/2023    Lab Results  Component Value Date   TSH 2.26 03/30/2023   Lab Results  Component Value Date   WBC 6.0 03/30/2023   HGB 12.9 03/30/2023   HCT 39.6 03/30/2023   MCV 86.0 03/30/2023   PLT 287.0 03/30/2023   Lab Results  Component Value Date   NA 136 03/30/2023   K  4.2 03/30/2023   CO2 25 03/30/2023   GLUCOSE 100 (H) 03/30/2023   BUN 16 03/30/2023   CREATININE 0.84 03/30/2023   BILITOT 0.5 03/30/2023   ALKPHOS 117 03/30/2023   AST 25 03/30/2023   ALT 24 03/30/2023   PROT 6.8 03/30/2023   ALBUMIN 4.3 03/30/2023   CALCIUM 9.6 03/30/2023   EGFR 73 12/10/2022   GFR 69.82 03/30/2023   Lab Results  Component Value Date   CHOL 130 03/30/2023   Lab Results  Component Value Date   HDL 58.20 03/30/2023   Lab Results  Component Value Date   LDLCALC 50 03/30/2023   Lab Results  Component Value Date   TRIG 111.0 03/30/2023   Lab Results  Component Value Date   CHOLHDL 2 03/30/2023   Lab Results  Component Value Date   HGBA1C 6.0 03/30/2023       Assessment & Plan:  Essential hypertension Assessment & Plan: Well controlled, no changes to meds. Encouraged heart healthy diet such as the DASH diet and exercise as tolerated.    Orders: -     Comprehensive metabolic panel -     CBC with Differential/Platelet -     TSH  HYPERCHOLESTEROLEMIA Assessment & Plan: Encourage heart healthy diet such as MIND or DASH diet, increase exercise, avoid trans fats, simple carbohydrates and processed foods, consider a krill or fish or flaxseed oil cap daily. Tolerating Atorvastatin  Orders: -     Lipid panel  Vitamin D deficiency Assessment & Plan: Supplement and monitor   Orders: -     VITAMIN D 25 Hydroxy (Vit-D Deficiency, Fractures)  Myalgia Assessment & Plan: Hydrate and monitor   Orders: -     Magnesium  Preventative health care Assessment & Plan: Patient encouraged to maintain heart healthy diet, regular exercise, adequate sleep. Consider daily probiotics. Take medications as prescribed. Labs ordered and reviewed. Last colonoscopy in June 2020 repeat in 2025. Last screening [facility]. She will consider a pap in the future. Dexa 2021 repeat in next year.    Back pain, unspecified back location, unspecified back pain  laterality, unspecified chronicity Assessment & Plan: She describes pain from neck to lower spine. She has pain and stiffness in the entire back and is bad with activity. She is ready to for referral to ortho and xrays to further evaluation and started on Tizanidine 1-4 mg as needed  Orders: -     Ambulatory referral to Orthopedic Surgery -     DG Lumbar Spine 2-3 Views; Future -     DG Thoracic Spine 2 View; Future  Atypical chest pain Assessment & Plan: Had an episode of left sided chest pain and back pain  with aching in arms that was reminescent of the discomfort in her arms she had in past with a 90 %. She agrees to return to cardiology for further consideration   Neck pain -     DG Cervical Spine Complete; Future  Hyperglycemia -     Hemoglobin A1c  Other orders -     tiZANidine HCl; Take 0.5-2 tablets (1-4 mg total) by mouth every 6 (six) hours as needed for muscle spasms.  Dispense: 30 tablet; Refill: 1    Assessment and Plan    Chronic Back Pain: Persistent discomfort from neck to lower back, affecting sleep and daily activities. Likely due to age-related changes including disc degeneration and possible arthritis. No recent trauma or injury reported. -Order cervical, thoracic, and lumbar spine x-rays. -Start Tizanidine 1-4mg  up to 3 times a day as needed for muscle relaxation. -Refer to orthopedic specialist for further evaluation.  Episodic Nausea: Occasional episodes of nausea and loss of appetite, more frequent in the past week. No clear pattern related to meals. No other gastrointestinal symptoms reported. -Continue current management with Emetrol as needed.  Cardiac History: History of coronary artery disease with stent placements. Recent onset of left-sided chest pain and upper back pain. No associated shortness of breath, sweating, or palpitations reported. -Order blood work to assess cardiac markers. -Refer to cardiology for further evaluation.  General Health  Maintenance: -Continue with regular exercise (chair exercises at church). -Consider bone density scan this year or next. -Schedule mammogram for August 2024. -Consider pelvic exam in the next 5 years. -Next colonoscopy due in 2025. -Ensure up-to-date with immunizations, including tetanus. -Encourage adequate hydration, spaced out throughout the day. -Follow-up in 3-4 months.         Danise Edge, MD

## 2023-03-30 ENCOUNTER — Ambulatory Visit (INDEPENDENT_AMBULATORY_CARE_PROVIDER_SITE_OTHER): Payer: Medicare Other | Admitting: Family Medicine

## 2023-03-30 ENCOUNTER — Ambulatory Visit (HOSPITAL_BASED_OUTPATIENT_CLINIC_OR_DEPARTMENT_OTHER)
Admission: RE | Admit: 2023-03-30 | Discharge: 2023-03-30 | Disposition: A | Discharge status: Home / Self Care | Payer: Medicare Other | Source: Ambulatory Visit | Attending: Family Medicine | Admitting: Family Medicine

## 2023-03-30 VITALS — BP 140/82 | HR 62 | Temp 97.5°F | Resp 16 | Ht 62.0 in | Wt 135.2 lb

## 2023-03-30 DIAGNOSIS — Z0001 Encounter for general adult medical examination with abnormal findings: Secondary | ICD-10-CM | POA: Diagnosis not present

## 2023-03-30 DIAGNOSIS — R0789 Other chest pain: Secondary | ICD-10-CM | POA: Diagnosis not present

## 2023-03-30 DIAGNOSIS — M5126 Other intervertebral disc displacement, lumbar region: Secondary | ICD-10-CM | POA: Diagnosis not present

## 2023-03-30 DIAGNOSIS — E7800 Pure hypercholesterolemia, unspecified: Secondary | ICD-10-CM | POA: Diagnosis not present

## 2023-03-30 DIAGNOSIS — R739 Hyperglycemia, unspecified: Secondary | ICD-10-CM | POA: Diagnosis not present

## 2023-03-30 DIAGNOSIS — M5137 Other intervertebral disc degeneration, lumbosacral region: Secondary | ICD-10-CM | POA: Diagnosis not present

## 2023-03-30 DIAGNOSIS — E559 Vitamin D deficiency, unspecified: Secondary | ICD-10-CM | POA: Diagnosis not present

## 2023-03-30 DIAGNOSIS — M47817 Spondylosis without myelopathy or radiculopathy, lumbosacral region: Secondary | ICD-10-CM | POA: Diagnosis not present

## 2023-03-30 DIAGNOSIS — M542 Cervicalgia: Secondary | ICD-10-CM

## 2023-03-30 DIAGNOSIS — I10 Essential (primary) hypertension: Secondary | ICD-10-CM

## 2023-03-30 DIAGNOSIS — Z0000 Encounter for general adult medical examination without abnormal findings: Secondary | ICD-10-CM

## 2023-03-30 DIAGNOSIS — M549 Dorsalgia, unspecified: Secondary | ICD-10-CM | POA: Insufficient documentation

## 2023-03-30 DIAGNOSIS — M791 Myalgia, unspecified site: Secondary | ICD-10-CM | POA: Diagnosis not present

## 2023-03-30 MED ORDER — TIZANIDINE HCL 2 MG PO TABS: 1.0000 mg | ORAL_TABLET | Freq: Four times a day (QID) | ORAL | 1 refills | Status: AC | PRN

## 2023-03-30 NOTE — Patient Instructions (Addendum)
Tetanus at pharmacy  Tylenol max in 24 hours is 3000 mg   Preventive Care 65 Years and Older, Female Preventive care refers to lifestyle choices and visits with your health care provider that can promote health and wellness. Preventive care visits are also called wellness exams. What can I expect for my preventive care visit? Counseling Your health care provider may ask you questions about your: Medical history, including: Past medical problems. Family medical history. Pregnancy and menstrual history. History of falls. Current health, including: Memory and ability to understand (cognition). Emotional well-being. Home life and relationship well-being. Sexual activity and sexual health. Lifestyle, including: Alcohol, nicotine or tobacco, and drug use. Access to firearms. Diet, exercise, and sleep habits. Work and work Astronomer. Sunscreen use. Safety issues such as seatbelt and bike helmet use. Physical exam Your health care provider will check your: Height and weight. These may be used to calculate your BMI (body mass index). BMI is a measurement that tells if you are at a healthy weight. Waist circumference. This measures the distance around your waistline. This measurement also tells if you are at a healthy weight and may help predict your risk of certain diseases, such as type 2 diabetes and high blood pressure. Heart rate and blood pressure. Body temperature. Skin for abnormal spots. What immunizations do I need?  Vaccines are usually given at various ages, according to a schedule. Your health care provider will recommend vaccines for you based on your age, medical history, and lifestyle or other factors, such as travel or where you work. What tests do I need? Screening Your health care provider may recommend screening tests for certain conditions. This may include: Lipid and cholesterol levels. Hepatitis C test. Hepatitis B test. HIV (human immunodeficiency virus)  test. STI (sexually transmitted infection) testing, if you are at risk. Lung cancer screening. Colorectal cancer screening. Diabetes screening. This is done by checking your blood sugar (glucose) after you have not eaten for a while (fasting). Mammogram. Talk with your health care provider about how often you should have regular mammograms. BRCA-related cancer screening. This may be done if you have a family history of breast, ovarian, tubal, or peritoneal cancers. Bone density scan. This is done to screen for osteoporosis. Talk with your health care provider about your test results, treatment options, and if necessary, the need for more tests. Follow these instructions at home: Eating and drinking  Eat a diet that includes fresh fruits and vegetables, whole grains, lean protein, and low-fat dairy products. Limit your intake of foods with high amounts of sugar, saturated fats, and salt. Take vitamin and mineral supplements as recommended by your health care provider. Do not drink alcohol if your health care provider tells you not to drink. If you drink alcohol: Limit how much you have to 0-1 drink a day. Know how much alcohol is in your drink. In the [place] one drink equals one 12 oz bottle of beer (355 mL), one 5 oz glass of wine (148 mL), or one 1 oz glass of hard liquor (44 mL). Lifestyle Brush your teeth every morning and night with fluoride toothpaste. Floss one time each day. Exercise for at least 30 minutes 5 or more days each week. Do not use any products that contain nicotine or tobacco. These products include cigarettes, chewing tobacco, and vaping devices, such as e-cigarettes. If you need help quitting, ask your health care provider. Do not use drugs. If you are sexually active, practice safe sex. Use a condom or other  form of protection in order to prevent STIs. Take aspirin only as told by your health care provider. Make sure that you understand how much to take and what form to  take. Work with your health care provider to find out whether it is safe and beneficial for you to take aspirin daily. Ask your health care provider if you need to take a cholesterol-lowering medicine (statin). Find healthy ways to manage stress, such as: Meditation, yoga, or listening to music. Journaling. Talking to a trusted person. Spending time with friends and family. Minimize exposure to UV radiation to reduce your risk of skin cancer. Safety Always wear your seat belt while driving or riding in a vehicle. Do not drive: If you have been drinking alcohol. Do not ride with someone who has been drinking. When you are tired or distracted. While texting. If you have been using any mind-altering substances or drugs. Wear a helmet and other protective equipment during sports activities. If you have firearms in your house, make sure you follow all gun safety procedures. What's next? Visit your health care provider once a year for an annual wellness visit. Ask your health care provider how often you should have your eyes and teeth checked. Stay up to date on all vaccines. This information is not intended to replace advice given to you by your health care provider. Make sure you discuss any questions you have with your health care provider. Document Revised: 02/27/2021 Document Reviewed: 02/27/2021 Elsevier Patient Education  2024 [employer].

## 2023-03-30 NOTE — Assessment & Plan Note (Signed)
Had an episode of left sided chest pain and back pain with aching in arms that was reminescent of the discomfort in her arms she had in past with a 90 %. She agrees to return to cardiology for further consideration

## 2023-03-30 NOTE — Assessment & Plan Note (Addendum)
She describes pain from neck to lower spine. She has pain and stiffness in the entire back and is bad with activity. She is ready to for referral to ortho and xrays to further evaluation and started on Tizanidine 1-4 mg as needed

## 2023-03-31 ENCOUNTER — Encounter: Payer: Self-pay | Admitting: Nurse Practitioner

## 2023-03-31 ENCOUNTER — Ambulatory Visit: Payer: Medicare Other | Attending: Nurse Practitioner | Admitting: Nurse Practitioner

## 2023-03-31 VITALS — BP 148/78 | HR 65 | Ht 62.0 in | Wt 134.0 lb

## 2023-03-31 DIAGNOSIS — E785 Hyperlipidemia, unspecified: Secondary | ICD-10-CM | POA: Diagnosis not present

## 2023-03-31 DIAGNOSIS — I2510 Atherosclerotic heart disease of native coronary artery without angina pectoris: Secondary | ICD-10-CM

## 2023-03-31 DIAGNOSIS — I10 Essential (primary) hypertension: Secondary | ICD-10-CM

## 2023-03-31 LAB — CBC WITH DIFFERENTIAL/PLATELET
Basophils Absolute: 0 10*3/uL (ref 0.0–0.1)
Basophils Relative: 0.8 % (ref 0.0–3.0)
Eosinophils Absolute: 0.1 10*3/uL (ref 0.0–0.7)
Eosinophils Relative: 1.6 % (ref 0.0–5.0)
HCT: 39.6 % (ref 36.0–46.0)
Hemoglobin: 12.9 g/dL (ref 12.0–15.0)
Lymphocytes Relative: 31.2 % (ref 12.0–46.0)
Lymphs Abs: 1.9 10*3/uL (ref 0.7–4.0)
MCHC: 32.6 g/dL (ref 30.0–36.0)
MCV: 86 fl (ref 78.0–100.0)
Monocytes Absolute: 0.5 10*3/uL (ref 0.1–1.0)
Monocytes Relative: 8.8 % (ref 3.0–12.0)
Neutro Abs: 3.5 10*3/uL (ref 1.4–7.7)
Neutrophils Relative %: 57.6 % (ref 43.0–77.0)
Platelets: 287 10*3/uL (ref 150.0–400.0)
RBC: 4.6 Mil/uL (ref 3.87–5.11)
RDW: 13.8 % (ref 11.5–15.5)
WBC: 6 10*3/uL (ref 4.0–10.5)

## 2023-03-31 LAB — MAGNESIUM: Magnesium: 1.9 mg/dL (ref 1.5–2.5)

## 2023-03-31 LAB — LIPID PANEL
Cholesterol: 130 mg/dL (ref 0–200)
HDL: 58.2 mg/dL (ref >39.00)
LDL Cholesterol: 50 mg/dL (ref 0–99)
NonHDL: 72.27
Total CHOL/HDL Ratio: 2
Triglycerides: 111 mg/dL (ref 0.0–149.0)
VLDL: 22.2 mg/dL (ref 0.0–40.0)

## 2023-03-31 LAB — TSH: TSH: 2.26 u[IU]/mL (ref 0.35–5.50)

## 2023-03-31 LAB — HEMOGLOBIN A1C: Hgb A1c MFr Bld: 6 % (ref 4.6–6.5)

## 2023-03-31 LAB — COMPREHENSIVE METABOLIC PANEL
ALT: 24 U/L (ref 0–35)
AST: 25 U/L (ref 0–37)
Albumin: 4.3 g/dL (ref 3.5–5.2)
Alkaline Phosphatase: 117 U/L (ref 39–117)
BUN: 16 mg/dL (ref 6–23)
CO2: 25 mEq/L (ref 19–32)
Calcium: 9.6 mg/dL (ref 8.4–10.5)
Chloride: 104 mEq/L (ref 96–112)
Creatinine, Ser: 0.84 mg/dL (ref 0.40–1.20)
GFR: 69.82 mL/min (ref >60.00)
Glucose, Bld: 100 mg/dL — ABNORMAL HIGH (ref 70–99)
Potassium: 4.2 mEq/L (ref 3.5–5.1)
Sodium: 136 mEq/L (ref 135–145)
Total Bilirubin: 0.5 mg/dL (ref 0.2–1.2)
Total Protein: 6.8 g/dL (ref 6.0–8.3)

## 2023-03-31 LAB — VITAMIN D 25 HYDROXY (VIT D DEFICIENCY, FRACTURES): VITD: 31.06 ng/mL (ref 30.00–100.00)

## 2023-03-31 MED ORDER — ISOSORBIDE MONONITRATE ER 30 MG PO TB24: 15.0000 mg | ORAL_TABLET | Freq: Every day | ORAL | 11 refills | Status: DC

## 2023-03-31 MED ORDER — ATENOLOL 50 MG PO TABS: 25.0000 mg | ORAL_TABLET | Freq: Two times a day (BID) | ORAL | 3 refills | Status: DC

## 2023-03-31 NOTE — Patient Instructions (Signed)
Medication Instructions:   DECREASE Atenolol one half (0.5) tablet by mouth ( 25 mg) twice daily.   START Imdur one half (0.5) tablet by mouth ( 12.5 mg) daily.   *If you need a refill on your cardiac medications before your next appointment, please call your pharmacy*   Lab Work:  None ordered.  If you have labs (blood work) drawn today and your tests are completely normal, you will receive your results only by: MyChart Message (if you have MyChart) OR A paper copy in the mail If you have any lab test that is abnormal or we need to change your treatment, we will call you to review the results.   Testing/Procedures:  You are scheduled for a Myocardial Perfusion Imaging Study on Thursday, July 18 at 7:30 am.   Please arrive 15 minutes prior to your appointment time for registration and insurance purposes.   The test will take approximately 3 to 4 hours to complete; you may bring reading material. If someone comes with you to your appointment, they will need to remain in the main lobby due to limited space in the testing area.   How to prepare for your Myocardial Perfusion test:   Do not eat or drink 3 hours prior to your test, except you may have water.    Do not consume products containing caffeine (regular or decaffeinated) 12 hours prior to your test (ex: coffee, chocolate, soda, tea)   Do bring a list of your current medications with you. If not listed below, you may take your medications as normal.   Bring any held medication to your appointment, as you may be required to take it once the test is complete.   Do wear comfortable clothes (no dresses or overalls) and walking shoes. Tennis shoes are preferred. No heels or open toed shoes.  Do not wear cologne, perfume or lotions (deodorant is allowed).   If these instructions are not followed, you test will have to be rescheduled.   Please report to [address] for your test. If you have questions or  concerns about your appointment, please call the Nuclear Lab at #[phone].  If you cannot keep your appointment, please provide 24 hour notification to the Nuclear lab to avoid a possible $50 charge to your account.       Follow-Up: At [facility], you and your health needs are our priority.  As part of our continuing mission to provide you with exceptional heart care, we have created designated Provider Care Teams.  These Care Teams include your primary Cardiologist (physician) and Advanced Practice Providers (APPs -  Physician Assistants and Nurse Practitioners) who all work together to provide you with the care you need, when you need it.  We recommend signing up for the patient portal called "MyChart".  Sign up information is provided on this After Visit Summary.  MyChart is used to connect with patients for Virtual Visits (Telemedicine).  Patients are able to view lab/test results, encounter notes, upcoming appointments, etc.  Non-urgent messages can be sent to your provider as well.   To learn more about what you can do with MyChart, go to [email or web address].    Your next appointment:   2 month(s)  Provider:   Eligha Bridegroom, NP

## 2023-03-31 NOTE — Progress Notes (Signed)
Cardiology Office Note:    Date:  03/31/2023   ID:  Brenda Lara, DOB [birth year], MRN [id number]  PCP:  Bradd Canary, MD   [facility] Providers Cardiologist:  Charlton Haws, MD     Referring MD: Bradd Canary, MD   Chief Complaint:   History of Present Illness:    Brenda Lara is a very pleasant 72 y.o. female with a hx of CAD s/p stenting of RCA in 2006 and LAD in 2005, chronic back pain, white coat hypertension, hyperlipidemia, and colon cancer  Last cath in 2010 revealed widely patent stents.  Exercise treadmill test 07/10/20 was normal.  Statin changed to Lipitor in February 2019 with target LDL reached.  Colon cancer in remission post colectomy.  Prior history of EF 55% with some apical hypokinesis but no recent echocardiogram.  She has maintained consistent follow-up for many years. Seen by Dr. Eden Emms on 06/25/21 at which time no changes were made to her treatment plan and she was advised to return in 1 year for follow-up.  Seen in clinic by me on 07/10/22 for follow-up. Reported overall doing well. Has a lot of neck and back and shoulder issues and sometimes wonders if she is unaware of chest pain. Bilateral arm pain was previous symptom of angina at age 63 when she had initial stent to LAD. Had passed multiple stress tests prior to that and finally underwent cardiac cath which revealed 80% blockage in LAD.  Was having a nuclear stress test in 2006 when blockage to RCA was noted. She was asymptomatic at that time. Doing chair exercise program at church. Walking does not aggravate her neck and back pain but she is no longer walking on a consistent basis. No chest pain, shortness of breath, arm pain, or other concerns with activity. Occasionally gets dizzy when bending pulling weeds. Home BP typically 118-130 over 62. Secondary prevention was encouraged, including increased physical activity, low sodium, heart healthy diet. No medication changes were made.  Seen in  follow-up by Dr. Eden Emms on 01/12/23 at which time she reported headaches from prior head trauma and cataract surgery.  LDL was at goal on Lipitor. She reported some myalgias but was encouraged to r/o lumbar stenosis. No specific cardiac concerns noted and 1 year f/u recommended.   Today, she is here for evaluation of chest and back pain similar to prior angina. On July 8, she started to have pain in left chest, pains occurring every few minutes. Lasted for a few hours. Felt pressure in her arms. Angina prior to both stents, was arm pressure she never had chest pain. Delivers meals on wheels twice weekly. Noticed she was tired pushing the cart of food trays today and felt some weakness afterwards. Typically has white coat hypertension. Home BP is well controlled. Felt particularly tired recently with SBP 116 mmHg. Also having a lot of back discomfort, hurting between shoulder blades most at this time, hurts in different areas. Is difficult to get comfortable at times. History of lumbar back surgery. Does chair exercises once weekly with no concerning cardiac symptoms.  She denies orthopnea, PND,  Past Medical History:  Diagnosis Date   Allergy    Anemia    Angina pectoris    Anxiety    Cataract    Cervical cancer screening 01/31/2016   Menarche at 12 Regular and moderate flow No history of abnormal pap in past G1P1, s/p 1 SVD No history of abnormal MGM No concerns today gyn surgeries  b/l tubal ligation and b/y oophorectomy   Colon cancer (HCC) 2010   Coronary artery disease 2001/2005   S/P stenting to RCA and LAD   GERD (gastroesophageal reflux disease)    Hearing loss 08/05/2015   History of chicken pox    HTN (hypertension)    Menopause    Preventative health care 01/31/2016   Pure hypercholesterolemia    Vitamin D deficiency 06/01/2017    Past Surgical History:  Procedure Laterality Date   APPENDECTOMY  02/21/2009   BACK SURGERY  1978, 1988   multiple   BILATERAL OOPHORECTOMY  09/15/2008    CHOLECYSTECTOMY  07/16/2008   COLON SURGERY  09/15/2008   right colectomy   COLONOSCOPY  01/26/2014   NORMAL    CORONARY STENT PLACEMENT  2001/2005   RCA   CYSTECTOMY     left ovary   EYE SURGERY Bilateral 2024   L cataract 2016, R 2024   MELANOMA EXCISION Right 03/05/2022   right lower leg lesion removed    Current Medications: Current Meds  Medication Sig   aspirin EC (ASPIRIN LOW DOSE) 81 MG tablet TAKE 1 TABLET (81 MG TOTAL) BY MOUTH DAILY. SWALLOW WHOLE.   atorvastatin (LIPITOR) 40 MG tablet TAKE 1 TABLET BY MOUTH EVERY DAY   Cholecalciferol (VITAMIN D3) 50 MCG (2000 UT) CAPS Take 1 capsule by mouth every 7 (seven) days.   isosorbide mononitrate (IMDUR) 30 MG 24 hr tablet Take 0.5 tablets (15 mg total) by mouth daily.   meclizine (ANTIVERT) 25 MG tablet Take 1 tablet (25 mg total) by mouth 3 (three) times daily as needed for dizziness.   Multiple Vitamin (MULTIVITAMIN) tablet Take 1 tablet by mouth daily.   nitroGLYCERIN (NITROSTAT) 0.4 MG SL tablet Place 1 tablet (0.4 mg total) under the tongue every 5 (five) minutes as needed for chest pain.   omeprazole (PRILOSEC) 40 MG capsule TAKE 1 CAPSULE (40 MG TOTAL) BY MOUTH DAILY.   tiZANidine (ZANAFLEX) 2 MG tablet Take 0.5-2 tablets (1-4 mg total) by mouth every 6 (six) hours as needed for muscle spasms.   topiramate (TOPAMAX) 100 MG tablet Take 100 mg by mouth daily.    [DISCONTINUED] atenolol (TENORMIN) 50 MG tablet TAKE 1 TABLET BY MOUTH TWICE A DAY     Allergies:   Patient has no known allergies.   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  Other Topics Concern   Not on file  Social History Narrative   Not on file   Social Determinants of Health   Financial Resource Strain: Low Risk  (08/02/2021)   Overall Financial Resource Strain (CARDIA)    Difficulty of Paying Living Expenses: Not hard at all  Food Insecurity: No Food Insecurity (08/04/2022)   Hunger Vital Sign    Worried About Running Out of Food in the Last Year: Never true    Ran Out of Food in  the Last Year: Never true  Transportation Needs: No Transportation Needs (08/04/2022)   PRAPARE - Administrator, Civil Service (Medical): No    Lack of Transportation (Non-Medical): No  Physical Activity: Inactive (08/02/2021)   Exercise Vital Sign    Days of Exercise per Week: 0 days    Minutes of Exercise per Session: 0 min  Stress: No Stress Concern Present (08/02/2021)   [employer] of Occupational Health - Occupational Stress Questionnaire    Feeling of Stress : Only a little  Social Connections: Socially Integrated (08/02/2021)   Social Connection and Isolation Panel [NHANES]    Frequency of Communication with Friends and Family: More than three times a week    Frequency of Social Gatherings with Friends and Family: More than three times a week    Attends Religious Services: More than 4 times per year    Active Member of [employer] or Organizations: Yes    Attends Engineer, structural: More than 4 times per year    Marital Status: Married     Family History: The patient's family history includes Alcohol abuse in her father; Arthritis in her father; Colon polyps in her mother; Coronary artery disease in an other family member; Heart attack in her father and mother; Heart disease in her father and mother; Hypertension in her mother; Leukemia in her mother; Prostate cancer in her father; Thyroid disease in her  daughter; Transient ischemic attack in her mother. There is no history of Colon cancer, Pancreatic cancer, Rectal cancer, Stomach cancer, or Esophageal cancer.  ROS:   Please see the history of present illness.    + chronic neck/back/shoulder pain  + angina All other systems reviewed and are negative.  Labs/Other Studies Reviewed:    The following studies were reviewed today:  ETT 07/10/20  Fair exercise capacity, achieved 6.3 METS Hypertensive response to exercise, peak BP 207/78 No stress EKG changes to suggest ischemia  Cardiac Cath 05/2009  Widely patent stents  Recent Labs: 03/30/2023: ALT 24; BUN 16; Creatinine, Ser 0.84; Hemoglobin 12.9; Magnesium 1.9; Platelets 287.0; Potassium 4.2; Sodium 136; TSH 2.26  Recent Lipid Panel    Component Value Date/Time   CHOL 130 03/30/2023 1420   CHOL 134 12/10/2022 0838   TRIG 111.0 03/30/2023 1420   HDL 58.20 03/30/2023 1420   HDL 60 12/10/2022 0838   CHOLHDL 2 03/30/2023 1420   VLDL 22.2 03/30/2023 1420   LDLCALC 50 03/30/2023 1420   LDLCALC 52 12/10/2022 0838     Risk Assessment/Calculations:      Physical Exam:    VS:  BP (!) 148/78   Pulse 65   Ht 5\' 2"  (1.575 m)   Wt 134 lb (60.8 kg)   SpO2 99%   BMI 24.51 kg/m     Wt Readings from Last 3 Encounters:  03/31/23 134 lb (60.8 kg)  03/30/23 135 lb 3.2 oz (61.3 kg)  01/12/23 138 lb 3.2 oz (62.7 kg)     GEN:  Well nourished, well developed in no acute distress HEENT: Normal NECK: No JVD; No carotid bruits CARDIAC: RRR, no murmurs, rubs, gallops RESPIRATORY:  Clear to auscultation without rales, wheezing or rhonchi  ABDOMEN: Soft, non-tender, non-distended MUSCULOSKELETAL:  No edema; No deformity. 2+ pedal pulses, equal bilaterally SKIN: Warm and dry NEUROLOGIC:  Alert and oriented x 3 PSYCHIATRIC:  Normal affect   EKG:   EKG Interpretation Date/Time:  Tuesday March 31 2023 13:58:01 EDT Ventricular Rate:  73 PR Interval:  182  QRS Duration:  90 QT  Interval:  432 QTC Calculation: 475 R Axis:   70  Text Interpretation: Normal sinus rhythm Normal ECG When compared with ECG of 13-May-2011 10:41, QT has lengthened Confirmed by Eligha Bridegroom [phone]) on 03/31/2023 3:51:49 PM    HYPERTENSION CONTROL Vitals:   03/31/23 1355 03/31/23 1603  BP: (!) 154/76 (!) 148/78    The patient's blood pressure is elevated above target today.  In order to address the patient's elevated BP: Blood pressure will be monitored at home to determine if medication changes need to be made.; The blood pressure is usually elevated in clinic.  Blood pressures monitored at home have been optimal.      Diagnoses:    1. Coronary artery disease involving native coronary artery of native heart without angina pectoris   2. Essential hypertension   3. Hyperlipidemia LDL goal <70     Assessment and Plan:     CAD with stable angina: Prior stenting in LAD 2005, RCA 2006 with patent stents identified on cath 2010 and 20-30% stenosis edge of LAD stent and 30% discrete stenosis RCA at that time. She is having symptoms of arm pressure that are concerning for angina. This was her symptom prior to previous stents.  She denies chest pain, dyspnea.  EKG does not reveal concern for ACS. We discussed potential stress testing. She is going on a trip 8/5. We will get a lexiscan myoview to rule out ischemia.  We will decrease atenolol to 25 mg twice daily and add Imdur 15 mg daily. No bleeding problems on aspirin. Continue aspirin, atorvastatin.   Hyperlipidemia LDL goal < 70: LDL 50 on 03/30/23. At goal. Continue atorvastatin.    Hypertension/White coat hypertension: BP is elevated today. History of white coat hypertension.  Home BP very well controlled. Encouraged her to continue to monitor in the setting of reducing atenolol and adding Imdur 15 mg daily..      Disposition: 2 months with me  Medication Adjustments/Labs and Tests Ordered: Current medicines are reviewed at  length with the patient today.  Concerns regarding medicines are outlined above.  Orders Placed This Encounter  Procedures   Cardiac Stress Test: Informed Consent Details: Physician/Practitioner Attestation; Transcribe to consent form and obtain patient signature   Myocardial Perfusion Imaging   EKG 12-Lead   Meds ordered this encounter  Medications   atenolol (TENORMIN) 50 MG tablet    Sig: Take 0.5 tablets (25 mg total) by mouth 2 (two) times daily.    Dispense:  90 tablet    Refill:  3   isosorbide mononitrate (IMDUR) 30 MG 24 hr tablet    Sig: Take 0.5 tablets (15 mg total) by mouth daily.    Dispense:  15 tablet    Refill:  11    Patient Instructions  Medication Instructions:   DECREASE Atenolol one half (0.5) tablet by mouth ( 25 mg) twice daily.   START Imdur one half (0.5) tablet by mouth ( 12.5 mg) daily.   *If you need a refill on your cardiac medications before your next appointment, please call your pharmacy*   Lab Work:  None ordered.  If you have labs (blood work) drawn today and your tests are completely normal, you will receive your results only by: MyChart Message (if you have MyChart) OR A paper copy in the mail If you have any lab test that is abnormal or we need to change your treatment, we will call you to review the results.  Testing/Procedures:  You are scheduled for a Myocardial Perfusion Imaging Study on Thursday, July 18 at 7:30 am.   Please arrive 15 minutes prior to your appointment time for registration and insurance purposes.   The test will take approximately 3 to 4 hours to complete; you may bring reading material. If someone comes with you to your appointment, they will need to remain in the main lobby due to limited space in the testing area.   How to prepare for your Myocardial Perfusion test:   Do not eat or drink 3 hours prior to your test, except you may have water.    Do not consume products containing caffeine (regular or  decaffeinated) 12 hours prior to your test (ex: coffee, chocolate, soda, tea)   Do bring a list of your current medications with you. If not listed below, you may take your medications as normal.   Bring any held medication to your appointment, as you may be required to take it once the test is complete.   Do wear comfortable clothes (no dresses or overalls) and walking shoes. Tennis shoes are preferred. No heels or open toed shoes.  Do not wear cologne, perfume or lotions (deodorant is allowed).   If these instructions are not followed, you test will have to be rescheduled.   Please report to [address] for your test. If you have questions or concerns about your appointment, please call the Nuclear Lab at #[phone].  If you cannot keep your appointment, please provide 24 hour notification to the Nuclear lab to avoid a possible $50 charge to your account.       Follow-Up: At [facility], you and your health needs are our priority.  As part of our continuing mission to provide you with exceptional heart care, we have created designated Provider Care Teams.  These Care Teams include your primary Cardiologist (physician) and Advanced Practice Providers (APPs -  Physician Assistants and Nurse Practitioners) who all work together to provide you with the care you need, when you need it.  We recommend signing up for the patient portal called "MyChart".  Sign up information is provided on this After Visit Summary.  MyChart is used to connect with patients for Virtual Visits (Telemedicine).  Patients are able to view lab/test results, encounter notes, upcoming appointments, etc.  Non-urgent messages can be sent to your provider as well.   To learn more about what you can do with MyChart, go to [email or web address].    Your next appointment:   2 month(s)  Provider:   Eligha Bridegroom, NP             Signed, Levi Aland, NP  03/31/2023 4:04 PM     [facility]

## 2023-04-01 ENCOUNTER — Telehealth (HOSPITAL_COMMUNITY): Payer: Self-pay | Admitting: *Deleted

## 2023-04-01 NOTE — Telephone Encounter (Signed)
Left detailed instructions on VM for MPI study.

## 2023-04-02 ENCOUNTER — Ambulatory Visit (HOSPITAL_COMMUNITY): Payer: Medicare Other | Attending: Cardiology

## 2023-04-02 DIAGNOSIS — E785 Hyperlipidemia, unspecified: Secondary | ICD-10-CM | POA: Diagnosis not present

## 2023-04-02 DIAGNOSIS — I2510 Atherosclerotic heart disease of native coronary artery without angina pectoris: Secondary | ICD-10-CM | POA: Diagnosis not present

## 2023-04-02 DIAGNOSIS — I10 Essential (primary) hypertension: Secondary | ICD-10-CM | POA: Diagnosis not present

## 2023-04-02 LAB — MYOCARDIAL PERFUSION IMAGING
LV dias vol: 51 mL (ref 46–106)
LV sys vol: 11 mL
Nuc Stress EF: 78 %
Peak HR: 91 {beats}/min
Rest HR: 65 {beats}/min
Rest Nuclear Isotope Dose: 10.5 mCi
SDS: 1
SRS: 0
SSS: 1
ST Depression (mm): 0 mm
Stress Nuclear Isotope Dose: 30.4 mCi
TID: 0.91

## 2023-04-02 MED ORDER — REGADENOSON 0.4 MG/5ML IV SOLN
0.4000 mg | Freq: Once | INTRAVENOUS | Status: AC
Start: 2023-04-02 — End: 2023-04-02
  Administered 2023-04-02: 0.4 mg via INTRAVENOUS

## 2023-04-02 MED ORDER — TECHNETIUM TC 99M TETROFOSMIN IV KIT
32.4000 | PACK | Freq: Once | INTRAVENOUS | Status: AC | PRN
  Administered 2023-04-02: 30.4 via INTRAVENOUS

## 2023-04-02 MED ORDER — TECHNETIUM TC 99M TETROFOSMIN IV KIT
10.9000 | PACK | Freq: Once | INTRAVENOUS | Status: AC | PRN
  Administered 2023-04-02: 10.5 via INTRAVENOUS

## 2023-04-03 ENCOUNTER — Ambulatory Visit (INDEPENDENT_AMBULATORY_CARE_PROVIDER_SITE_OTHER): Payer: Medicare Other | Admitting: Physical Medicine and Rehabilitation

## 2023-04-03 ENCOUNTER — Encounter: Payer: Self-pay | Admitting: Physical Medicine and Rehabilitation

## 2023-04-03 DIAGNOSIS — M546 Pain in thoracic spine: Secondary | ICD-10-CM | POA: Diagnosis not present

## 2023-04-03 DIAGNOSIS — M545 Low back pain, unspecified: Secondary | ICD-10-CM | POA: Diagnosis not present

## 2023-04-03 DIAGNOSIS — M542 Cervicalgia: Secondary | ICD-10-CM

## 2023-04-03 DIAGNOSIS — G8929 Other chronic pain: Secondary | ICD-10-CM | POA: Diagnosis not present

## 2023-04-03 DIAGNOSIS — M7918 Myalgia, other site: Secondary | ICD-10-CM | POA: Diagnosis not present

## 2023-04-03 NOTE — Progress Notes (Signed)
Functional Pain Scale - descriptive words and definitions  Uncomfortable (3)  Pain is present but can complete all ADL's/sleep is slightly affected and passive distraction only gives marginal relief. Mild range order  Average Pain  varies  Neck pain on both sides that radiates into the shoulders and down the arms. Can start to have pain in lower back

## 2023-04-03 NOTE — Progress Notes (Signed)
Brenda Lara - 71 y.o. female MRN [id number]  Date of birth: 05-21-1951  Office Visit Note: Visit Date: 04/03/2023 PCP: Bradd Canary, MD Referred by: Bradd Canary, MD  Subjective: Chief Complaint  Patient presents with   Neck - Pain   HPI: Brenda Lara is a 72 y.o. female who comes in today per the request of Dr. Danise Edge for evaluation of chronic, worsening and severe bilateral neck pain radiating to shoulders radiating down to bilateral thoracic and lumbar spine. Pain ongoing for several years. States pain gradually radiates from neck down to lower spine throughout course of her day. No pain radiating down arms or legs. Pain ongoing for several months, worsens with activity and movement. She does participate in chair exercise classes at church, however she reports these classes cause increased pain. She describes pain as tight and sore sensation, currently rates as 7 out of 10. Some relief of pain with home exercise regimen, rest and use of medications. History of (2) lumbar surgeries at the age of 84 and 65 for disc herniation. Patient does carry diagnosis of chronic migraine, currently being managed by Dr Santiago Glad. Patient denies focal weakness, numbness and tingling. No recent trauma or falls.    Review of Systems  Musculoskeletal:  Positive for back pain and myalgias.  Neurological:  Negative for tingling, sensory change, focal weakness and weakness.  All other systems reviewed and are negative.  Otherwise per HPI.  Assessment & Plan: Visit Diagnoses:    ICD-10-CM   1. Cervicalgia  M54.2 MR CERVICAL SPINE WO CONTRAST    Ambulatory referral to Physical Therapy    2. Chronic bilateral thoracic back pain  M54.6 Ambulatory referral to Physical Therapy   G89.29     3. Chronic bilateral low back pain without sciatica  M54.50 Ambulatory referral to Physical Therapy   G89.29     4. Myofascial pain syndrome  M79.18 Ambulatory referral to Physical Therapy        Plan: Findings:  Chronic, worsening and severe bilateral neck pain radiating to shoulders and down to thoracic and lumbar spine. No radicular symptoms noted. Patient continues to have severe pain despite good conservative therapies such as home exercise regimen, rest and use of medications. Her exam today is non focal, no myelopathic symptoms noted. Patients clinical presentation and exam are consistent with myofascial pain syndrome. Tenderness noted to bilateral levator scapulae and trapezius muscles today. I discussed treatment plan in detail today. Next step is to place order for short course of formal physical therapy, I do feel she would benefit from manual treatments and dry needling. I also placed order for cervical MRI imaging. Recent cervical x-rays does show multi level disc space narrowing and facet arthropathy. I will see patient back for cervical MRI review. No red flag symptoms noted upon exam today.   Dr. Alvester Morin participated with direct patient care including clinical review, exam when needed and significant portion of diagnostic and treatment plan.        Meds & Orders: No orders of the defined types were placed in this encounter.   Orders Placed This Encounter  Procedures   MR CERVICAL SPINE WO CONTRAST   Ambulatory referral to Physical Therapy    Follow-up: Return for cervical MRI review.   Procedures: No procedures performed      Clinical History: No specialty comments available.   She reports that she has never smoked. She has never used smokeless tobacco.  Recent Labs  03/30/23 1420  HGBA1C 6.0    Objective:  VS:  HT:    WT:   BMI:     BP:   HR: bpm  TEMP: ( )  RESP:  Physical Exam Vitals and nursing note reviewed.  HENT:     Head: Normocephalic and atraumatic.     Right Ear: External ear normal.     Left Ear: External ear normal.     Nose: Nose normal.     Mouth/Throat:     Mouth: Mucous membranes are moist.  Eyes:     Extraocular  Movements: Extraocular movements intact.  Cardiovascular:     Rate and Rhythm: Normal rate.     Pulses: Normal pulses.  Pulmonary:     Effort: Pulmonary effort is normal.  Abdominal:     General: Abdomen is flat. There is no distension.  Musculoskeletal:        General: Tenderness present.     Cervical back: Tenderness present.     Comments: No discomfort noted with flexion, extension and side-to-side rotation. Patient has good strength in the upper extremities including 5 out of 5 strength in wrist extension, long finger flexion and APB. Shoulder range of motion is full bilaterally without any sign of impingement. There is no atrophy of the hands intrinsically. Sensation intact bilaterally. Tenderness noted to bilateral levator scapulae and trapezius muscles. Negative Hoffman's sign. Negative Spurling's sign.     Skin:    General: Skin is warm and dry.     Capillary Refill: Capillary refill takes less than 2 seconds.  Neurological:     General: No focal deficit present.     Mental Status: She is alert and oriented to person, place, and time.  Psychiatric:        Mood and Affect: Mood normal.        Behavior: Behavior normal.     Ortho Exam  Imaging: No results found.  Past Medical/Family/Surgical/Social History: Medications & Allergies reviewed per EMR, new medications updated. Patient Active Problem List   Diagnosis Date Noted   Sun-damaged skin 04/03/2021   Back pain 04/03/2021   Hepatomegaly 04/03/2021   Myalgia 04/02/2020   Diarrhea 04/02/2020   Left leg pain 11/15/2017   Left arm pain 11/15/2017   Vitamin D deficiency 06/01/2017   Cervical cancer screening 01/31/2016   Preventative health care 01/31/2016   Hearing loss 08/05/2015   History of chicken pox    Anemia 12/07/2013   Headache 12/09/2012   Abnormal MRI 08/10/2011   Vertigo 05/29/2011   Anxiety    Colon cancer (HCC)    Menopause    Coronary artery disease involving native heart without angina pectoris  06/13/2009   CONGENITAL FACTOR VIII DISORDER 05/30/2009   Atypical chest pain 05/30/2009   HYPERCHOLESTEROLEMIA 12/05/2008   Essential hypertension 12/05/2008   GERD 12/05/2008   Past Medical History:  Diagnosis Date   Allergy    Anemia    Angina pectoris    Anxiety    Cataract    Cervical cancer screening 01/31/2016   Menarche at 12 Regular and moderate flow No history of abnormal pap in past G1P1, s/p 1 SVD No history of abnormal MGM No concerns today gyn surgeries b/l tubal ligation and b/y oophorectomy   Colon cancer (HCC) 2010   Coronary artery disease 2001/2005   S/P stenting to RCA and LAD   GERD (gastroesophageal reflux disease)    Hearing loss 08/05/2015   History of chicken pox    HTN (hypertension)  Menopause    Preventative health care 01/31/2016   Pure hypercholesterolemia    Vitamin D deficiency 06/01/2017   Family History  Problem Relation Age of Onset   Colon polyps Mother    Hypertension Mother    Transient ischemic attack Mother    Heart attack Mother        age 72   Heart disease Mother    Leukemia Mother    Heart attack Father        11's   Heart disease Father    Alcohol abuse Father    Arthritis Father        mva   Prostate cancer Father    Thyroid disease Daughter    Coronary artery disease Other    Colon cancer Neg Hx    Pancreatic cancer Neg Hx    Rectal cancer Neg Hx    Stomach cancer Neg Hx    Esophageal cancer Neg Hx    Past Surgical History:  Procedure Laterality Date   APPENDECTOMY  02/21/2009   BACK SURGERY  1978, 1988   multiple   BILATERAL OOPHORECTOMY  09/15/2008   CHOLECYSTECTOMY  07/16/2008   COLON SURGERY  09/15/2008   right colectomy   COLONOSCOPY  01/26/2014   NORMAL    CORONARY STENT PLACEMENT  2001/2005   RCA   CYSTECTOMY     left ovary   EYE SURGERY Bilateral 2024   L cataract 2016, R 2024   MELANOMA EXCISION Right 03/05/2022   right lower leg lesion removed   Social History   Occupational History    Not on file  Tobacco Use   Smoking status: Never   Smokeless tobacco: Never  Vaping Use   Vaping status: Never Used  Substance and Sexual Activity   Alcohol use: No   Drug use: No   Sexual activity: Yes    Birth control/protection: Post-menopausal    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, 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

## 2023-04-06 NOTE — Progress Notes (Signed)
Pt has been made aware of normal result and verbalized understanding.  jw

## 2023-04-13 ENCOUNTER — Encounter: Payer: Self-pay | Admitting: Physical Therapy

## 2023-04-13 ENCOUNTER — Ambulatory Visit: Payer: Medicare Other | Admitting: Physical Therapy

## 2023-04-13 ENCOUNTER — Other Ambulatory Visit: Payer: Self-pay

## 2023-04-13 DIAGNOSIS — M5459 Other low back pain: Secondary | ICD-10-CM

## 2023-04-13 DIAGNOSIS — M542 Cervicalgia: Secondary | ICD-10-CM | POA: Diagnosis not present

## 2023-04-13 DIAGNOSIS — M546 Pain in thoracic spine: Secondary | ICD-10-CM | POA: Diagnosis not present

## 2023-04-13 DIAGNOSIS — M6281 Muscle weakness (generalized): Secondary | ICD-10-CM | POA: Diagnosis not present

## 2023-04-13 NOTE — Therapy (Signed)
OUTPATIENT PHYSICAL THERAPY neck/back EVALUATION   Patient Name: Brenda Lara MRN: [id number] DOB:11-Nov-1950, 72 y.o., female Today's Date: 04/13/2023  END OF SESSION:  PT End of Session - 04/13/23 1114     Visit Number 1    Number of Visits 10    Date for PT Re-Evaluation 07/06/23    Authorization Type [employer] MCR    PT Start Time 1014    PT Stop Time 1103    PT Time Calculation (min) 49 min    Activity Tolerance Patient tolerated treatment well    Behavior During Therapy WFL for tasks assessed/performed             Past Medical History:  Diagnosis Date   Allergy    Anemia    Angina pectoris    Anxiety    Cataract    Cervical cancer screening 01/31/2016   Menarche at 12 Regular and moderate flow No history of abnormal pap in past G1P1, s/p 1 SVD No history of abnormal MGM No concerns today gyn surgeries b/l tubal ligation and b/y oophorectomy   Colon cancer (HCC) 2010   Coronary artery disease 2001/2005   S/P stenting to RCA and LAD   GERD (gastroesophageal reflux disease)    Hearing loss 08/05/2015   History of chicken pox    HTN (hypertension)    Menopause    Preventative health care 01/31/2016   Pure hypercholesterolemia    Vitamin D deficiency 06/01/2017   Past Surgical History:  Procedure Laterality Date   APPENDECTOMY  02/21/2009   BACK SURGERY  1978, 1988   multiple   BILATERAL OOPHORECTOMY  09/15/2008   CHOLECYSTECTOMY  07/16/2008   COLON SURGERY  09/15/2008   right colectomy   COLONOSCOPY  01/26/2014   NORMAL    CORONARY STENT PLACEMENT  2001/2005   RCA   CYSTECTOMY     left ovary   EYE SURGERY Bilateral 2024   L cataract 2016, R 2024   MELANOMA EXCISION Right 03/05/2022   right lower leg lesion removed   Patient Active Problem List   Diagnosis Date Noted   Sun-damaged skin 04/03/2021   Back pain 04/03/2021   Hepatomegaly 04/03/2021   Myalgia 04/02/2020   Diarrhea 04/02/2020   Left leg pain 11/15/2017   Left arm pain 11/15/2017    Vitamin D deficiency 06/01/2017   Cervical cancer screening 01/31/2016   Preventative health care 01/31/2016   Hearing loss 08/05/2015   History of chicken pox    Anemia 12/07/2013   Headache 12/09/2012   Abnormal MRI 08/10/2011   Vertigo 05/29/2011   Anxiety    Colon cancer (HCC)    Menopause    Coronary artery disease involving native heart without angina pectoris 06/13/2009   CONGENITAL FACTOR VIII DISORDER 05/30/2009   Atypical chest pain 05/30/2009   HYPERCHOLESTEROLEMIA 12/05/2008   Essential hypertension 12/05/2008   GERD 12/05/2008    PCP: Bradd Canary, MD   REFERRING PROVIDER: Juanda Chance, NP  REFERRING DIAG: M54.2 (ICD-10-CM) - Cervicalgia M54.6,G89.29 (ICD-10-CM) - Chronic bilateral thoracic back pain M54.50,G89.29 (ICD-10-CM) - Chronic bilateral low back pain without sciatica M79.18 (ICD-10-CM) - Myofascial pain syndrome  Rationale for Evaluation and Treatment: Rehabilitation  THERAPY DIAG:  Cervicalgia  Other low back pain  Pain in thoracic spine  Muscle weakness (generalized)  ONSET DATE: chronic pain for many years  SUBJECTIVE:  SUBJECTIVE STATEMENT: chronic, worsening and severe bilateral neck pain radiating to shoulders radiating down to bilateral thoracic and lumbar spine. Pain ongoing for several years. States pain gradually radiates from neck down to lower spine throughout course of her day. No pain radiating down arms or legs and denies any N/T. Pain ongoing for several months, worsens with activity and movement. She does participate in chair exercise classes at church 1 time per week, however she reports these classes cause increased pain so she has not done them in last 2 weeks. She feels it is now hard to find a comfortable position   PERTINENT HISTORY:   PMH: History of (2) lumbar surgeries at the age of 89 and 33 for disc herniation, Ca in remission 10 years, CAD,   PAIN:  Are you having pain? Yes: NPRS scale: she says it is hard to rate but guesses 8-9/10 Pain location: neck, shoulders, down to back Pain description: tight, ache Aggravating factors: housework, lifting, standing, walking Relieving factors: biofreeze, ice, heat  PRECAUTIONS: None  RED FLAGS: Bowel or bladder incontinence: No   WEIGHT BEARING RESTRICTIONS: No  FALLS:  Has patient fallen in last 6 months? No  PLOF: Independent with basic ADLs  PATIENT GOALS: try to get the pain under contol  NEXT MD VISIT: not sched  OBJECTIVE:   DIAGNOSTIC FINDINGS:  Has MRI schedule 04/17/23 "XR show degenerative changes and ostopenia"  PATIENT SURVEYS:  Eval: FOTO 52% functional intake, goal is 59%   COGNITION: Overall cognitive status: Within functional limits for tasks assessed     SENSATION: WFL  MUSCLE LENGTH: Hamstrings: very tight bilat Upper traps, very tight bilat  PALPATION: Tender to palpation in upper traps, cervical and lumbar paraspinals bilat  LUMBAR ROM:   AROM eval  Flexion 25%  Extension 10%  Right lateral flexion   Left lateral flexion   Right rotation 25%  Left rotation 25%   (Blank rows = not tested)  Neck ROM:   AROM deg eval  Flexion 20  Extension 30  Right lateral flexion 10  Left lateral flexion 20  Right rotation 45  Left rotation 30   (Blank rows = not tested)   LOWER EXTREMITY MMT:    MMT Right eval Left eval  Hip flexion [facility]  Hip extension    Hip abduction [facility]  Hip adduction    Hip internal rotation    Hip external rotation    Knee flexion [facility] WFL  Knee extension [facility] WFl  Ankle dorsiflexion    Ankle plantarflexion    Ankle inversion    Ankle eversion     (Blank rows = not tested)  Upper EXTREMITY MMT:    MMT Right eval Left eval  Shoulder flexion 4 4  Shoulder  extension 4 4  Shoulder  abduction 4 4  Shoulder internal rotation 4+ 4+  Shoulder rotation 4 4  Elbow flexion 4 4  Elbow extension 4 4    SPECIAL TESTS:  Eval: Pain and tightness with SLR test bilat, some relief expressed with manual cervical distraction  FUNCTIONAL TESTS:    Gait  Eval Comments: WFL but pain with prolonged walking  TODAY'S TREATMENT:  Eval HEP creation and review with demonstration and trial set preformed, see below for details IFC TENS to bilat cervical P.S and upper traps X 15 min with moist heat    PATIENT EDUCATION: Education details: HEP, PT plan of care, TENS therapy Person educated: Patient Education method: Explanation, Demonstration, Verbal cues, and  Handouts Education comprehension: verbalized understanding and needs further education   HOME EXERCISE PROGRAM: Access Code: [email or web address] URL: [email or web address] Date: 04/13/2023 Prepared by: Ivery Quale  Exercises - Seated Cervical Retraction  - 2 x daily - 6 x weekly - 1 sets - 10 reps - Seated Assisted Cervical Rotation with Towel  - 2 x daily - 6 x weekly - 1 sets - 10 reps - 5 hold - Seated Cervical Sidebending Stretch  - 2 x daily - 6 x weekly - 3 sets - 15 hold - Standing Shoulder Row with Anchored Resistance  - 2 x daily - 6 x weekly - 2 sets - 10 reps - Shoulder Extension with Resistance - Neutral  - 2 x daily - 6 x weekly - 2 sets - 10 reps - Lower Trunk Rotations  - 2 x daily - 6 x weekly - 1 sets - 5 reps - 10 hold - Hooklying Single Knee to Chest Stretch  - 2 x daily - 6 x weekly - 1 sets - 3 reps - 20 hold  Patient Education - TENS UNIT - AUVON Dual Channel TENS Unit  ASSESSMENT:  CLINICAL IMPRESSION: Patient referred to PT for Eval and Treat: Cervicalgia, Thoracic back pain, lower back pain. MD recommending to Consider manual treatments and dry needling. We did trial Electrical stimulation today and I printed her out info on HEP and for home TENS unit.  Patient will benefit from skilled  PT to address below impairments, limitations and improve overall function.  OBJECTIVE IMPAIRMENTS: decreased activity tolerance, difficulty walking or standing for long periods, decreased mobility, decreased ROM, decreased strength, impaired flexibility, impaired UE use, postural dysfunction, and pain.  ACTIVITY LIMITATIONS: bending, lifting, carry, locomotion, cleaning, community activity, driving,   PERSONAL FACTORS: PMH: History of (2) lumbar surgeries at the age of 58 and 16 for disc herniation, Ca but in remission, CAD,  are also affecting patient's functional outcome.  REHAB POTENTIAL: Good  CLINICAL DECISION MAKING: Stable/uncomplicated  EVALUATION COMPLEXITY: Low    GOALS: Short term PT Goals Target date: 05/11/2023   Pt will be I and compliant with HEP. Baseline:  Goal status: New Pt will decrease pain by 25% overall to less than 6/10 on avg Baseline: Goal status: New  Long term PT goals Target date:07/06/2023   Pt will improve cervical and lumbar ROM to [facility] >50% to improve functional mobility Baseline: Goal status: New Pt will improve  bilat UE strength to 4+/5 MMT to improve functional strength Baseline: Goal status: New Pt will improve FOTO to at least 59 % functional to show improved function Baseline: Goal status: New Pt will reduce pain by overall 50% overall to less than 4/10  with usual activity Baseline: Goal status: New  PLAN: PT FREQUENCY: 1-2 times per week   PT DURATION: 6-12 weeks  PLANNED INTERVENTIONS (unless contraindicated): aquatic PT, Canalith repositioning, cryotherapy, Electrical stimulation, Iontophoresis with 4 mg/ml dexamethasome, Moist heat, traction, Ultrasound, gait training, Therapeutic exercise, balance training, neuromuscular re-education, patient/family education,  manual techniques, passive ROM, dry needling, taping, vasopnuematic device, vestibular, spinal manipulations, joint manipulations  PLAN FOR NEXT SESSION: does she  have MRI results? How is HEP? Consider DN or traction based on MRI results    April Manson, PT,DPT 04/13/2023, 11:16 AM

## 2023-04-17 ENCOUNTER — Ambulatory Visit: Admission: RE | Admit: 2023-04-17 | Payer: Medicare Other | Source: Ambulatory Visit

## 2023-04-17 DIAGNOSIS — M47812 Spondylosis without myelopathy or radiculopathy, cervical region: Secondary | ICD-10-CM | POA: Diagnosis not present

## 2023-04-28 ENCOUNTER — Telehealth: Payer: Self-pay | Admitting: Physical Medicine and Rehabilitation

## 2023-04-28 NOTE — Telephone Encounter (Signed)
Spoke with patient this afternoon regarding recent cervical MRI results. Facet arthropathy noted on left at C4-C5, bilateral at C5-C6. No high grade spinal canal stenosis. Findings could certainly cause neck pain. She would like to continue with PT, would consider facet joint injections if her pain persists.

## 2023-05-04 ENCOUNTER — Ambulatory Visit: Payer: Medicare Other | Admitting: Physical Therapy

## 2023-05-04 ENCOUNTER — Encounter: Payer: Self-pay | Admitting: Physical Therapy

## 2023-05-04 DIAGNOSIS — M546 Pain in thoracic spine: Secondary | ICD-10-CM

## 2023-05-04 DIAGNOSIS — M542 Cervicalgia: Secondary | ICD-10-CM

## 2023-05-04 DIAGNOSIS — M6281 Muscle weakness (generalized): Secondary | ICD-10-CM

## 2023-05-04 DIAGNOSIS — M5459 Other low back pain: Secondary | ICD-10-CM

## 2023-05-04 NOTE — Therapy (Signed)
OUTPATIENT PHYSICAL THERAPY TREATMENT   Patient Name: Brenda Lara MRN: [id number] DOB:[birth year], 72 y.o., female Today's Date: 05/04/2023  END OF SESSION:  PT End of Session - 05/04/23 1610     Visit Number 2    Number of Visits 10    Date for PT Re-Evaluation 07/06/23    Authorization Type U[employer] MCR    PT Start Time 1515    PT Stop Time 1600    PT Time Calculation (min) 45 min    Activity Tolerance Patient tolerated treatment well    Behavior During Therapy WFL for tasks assessed/performed              Past Medical History:  Diagnosis Date   Allergy    Anemia    Angina pectoris    Anxiety    Cataract    Cervical cancer screening 01/31/2016   Menarche at 12 Regular and moderate flow No history of abnormal pap in past G1P1, s/p 1 SVD No history of abnormal MGM No concerns today gyn surgeries b/l tubal ligation and b/y oophorectomy   Colon cancer (HCC) 2010   Coronary artery disease 2001/2005   S/P stenting to RCA and LAD   GERD (gastroesophageal reflux disease)    Hearing loss 08/05/2015   History of chicken pox    HTN (hypertension)    Menopause    Preventative health care 01/31/2016   Pure hypercholesterolemia    Vitamin D deficiency 06/01/2017   Past Surgical History:  Procedure Laterality Date   APPENDECTOMY  02/21/2009   BACK SURGERY  1978, 1988   multiple   BILATERAL OOPHORECTOMY  09/15/2008   CHOLECYSTECTOMY  07/16/2008   COLON SURGERY  09/15/2008   right colectomy   COLONOSCOPY  01/26/2014   NORMAL    CORONARY STENT PLACEMENT  2001/2005   RCA   CYSTECTOMY     left ovary   EYE SURGERY Bilateral 2024   L cataract 2016, R 2024   MELANOMA EXCISION Right 03/05/2022   right lower leg lesion removed   Patient Active Problem List   Diagnosis Date Noted   Sun-damaged skin 04/03/2021   Back pain 04/03/2021   Hepatomegaly 04/03/2021   Myalgia 04/02/2020   Diarrhea 04/02/2020   Left leg pain 11/15/2017   Left arm pain 11/15/2017   Vitamin D  deficiency 06/01/2017   Cervical cancer screening 01/31/2016   Preventative health care 01/31/2016   Hearing loss 08/05/2015   History of chicken pox    Anemia 12/07/2013   Headache 12/09/2012   Abnormal MRI 08/10/2011   Vertigo 05/29/2011   Anxiety    Colon cancer (HCC)    Menopause    Coronary artery disease involving native heart without angina pectoris 06/13/2009   CONGENITAL FACTOR VIII DISORDER 05/30/2009   Atypical chest pain 05/30/2009   HYPERCHOLESTEROLEMIA 12/05/2008   Essential hypertension 12/05/2008   GERD 12/05/2008    PCP: Bradd Canary, MD   REFERRING PROVIDER: Juanda Chance, NP  REFERRING DIAG: M54.2 (ICD-10-CM) - Cervicalgia M54.6,G89.29 (ICD-10-CM) - Chronic bilateral thoracic back pain M54.50,G89.29 (ICD-10-CM) - Chronic bilateral low back pain without sciatica M79.18 (ICD-10-CM) - Myofascial pain syndrome  Rationale for Evaluation and Treatment: Rehabilitation  THERAPY DIAG:  Cervicalgia  Other low back pain  Pain in thoracic spine  Muscle weakness (generalized)  ONSET DATE: chronic pain for many years  SUBJECTIVE:  SUBJECTIVE STATEMENT: She states she has been on vacation and has been able to take it easy so not as much pain overall.  PERTINENT HISTORY:  PMH: History of (2) lumbar surgeries at the age of 57 and 45 for disc herniation, Ca in remission 10 years, CAD,   PAIN:  Are you having pain? Yes: NPRS scale: she does not rate but says it is not bad today as she has been able to take it easy./10 Pain location: neck, shoulders, down to back Pain description: tight, ache Aggravating factors: housework, lifting, standing, walking Relieving factors: biofreeze, ice, heat  PRECAUTIONS: None  RED FLAGS: Bowel or bladder incontinence: No   WEIGHT  BEARING RESTRICTIONS: No  FALLS:  Has patient fallen in last 6 months? No  PLOF: Independent with basic ADLs  PATIENT GOALS: try to get the pain under contol  NEXT MD VISIT: not sched  OBJECTIVE:   DIAGNOSTIC FINDINGS:  MRI 04/28/23 IMPRESSION: 1. C4-5: Facet arthropathy on the left with some edema. This could be a cause of neck pain. Left-sided foraminal narrowing could affect the left C5 nerve. 2. C5-6: Spondylosis. Bilateral facet osteoarthritis. Foraminal narrowing on the right could affect the C6 nerve. 3. C6-7: Spondylosis. Bilateral foraminal narrowing right more than left that could affect the right C7 nerve.   "XR show degenerative changes and ostopenia"  PATIENT SURVEYS:  Eval: FOTO 52% functional intake, goal is 59%   COGNITION: Overall cognitive status: Within functional limits for tasks assessed     SENSATION: WFL  MUSCLE LENGTH: Hamstrings: very tight bilat Upper traps, very tight bilat  PALPATION: Tender to palpation in upper traps, cervical and lumbar paraspinals bilat  LUMBAR ROM:   AROM eval  Flexion 25%  Extension 10%  Right lateral flexion   Left lateral flexion   Right rotation 25%  Left rotation 25%   (Blank rows = not tested)  Neck ROM:   AROM deg eval  Flexion 20  Extension 30  Right lateral flexion 10  Left lateral flexion 20  Right rotation 45  Left rotation 30   (Blank rows = not tested)   LOWER EXTREMITY MMT:    MMT Right eval Left eval  Hip flexion [facility]  Hip extension    Hip abduction [facility]  Hip adduction    Hip internal rotation    Hip external rotation    Knee flexion [facility] WFL  Knee extension [facility]  Ankle dorsiflexion    Ankle plantarflexion    Ankle inversion    Ankle eversion     (Blank rows = not tested)  Upper EXTREMITY MMT:    MMT Right eval Left eval  Shoulder flexion 4 4  Shoulder  extension 4 4  Shoulder abduction 4 4  Shoulder internal rotation 4+ 4+  Shoulder rotation 4 4   Elbow flexion 4 4  Elbow extension 4 4    SPECIAL TESTS:  Eval: Pain and tightness with SLR test bilat, some relief expressed with manual cervical distraction  FUNCTIONAL TESTS:    Gait  Eval Comments: WFL but pain with prolonged walking  TODAY'S TREATMENT:  05/04/23 Home TENS unit set up and application X 20 minutes with moist heat to bilat cervical P.S and upper traps. Education provided for how to set up and use with tips and precautions of use provided. Levator stretch 30 sec X 2 bilat Upper trap stretch 30 sec X 2 bilat Chin tucks X 10  Scapular retractions X 10 Shoulder extensions red  X 5, stopped due to painful crepitus and will remove from HEP Shoulder rows red X 15    PATIENT EDUCATION: Education details: HEP, PT plan of care, TENS therapy Person educated: Patient Education method: Explanation, Demonstration, Verbal cues, and Handouts Education comprehension: verbalized understanding and needs further education   HOME EXERCISE PROGRAM: Access Code: [email or web address] URL: [email or web address] Date: 05/04/2023 Prepared by: Ivery Quale  Exercises - Seated Cervical Retraction  - 2 x daily - 6 x weekly - 1 sets - 10 reps - Seated Assisted Cervical Rotation with Towel  - 2 x daily - 6 x weekly - 1 sets - 10 reps - 5 hold - Seated Cervical Sidebending Stretch  - 2 x daily - 6 x weekly - 3 sets - 15 hold - Standing Shoulder Row with Anchored Resistance  - 2 x daily - 6 x weekly - 2 sets - 10 reps - Lower Trunk Rotations  - 2 x daily - 6 x weekly - 1 sets - 5 reps - 10 hold - Hooklying Single Knee to Chest Stretch  - 2 x daily - 6 x weekly - 1 sets - 3 reps - 20 hold - Seated Levator Scapulae Stretch  - 2 x daily - 6 x weekly - 2-3 reps - 30 hold  Patient Education - TENS UNIT - AUVON Dual Channel TENS Unit  ASSESSMENT:  CLINICAL IMPRESSION: She brought in home TENS unit and PT provided education on how to set up and use this for pain control. We then  reviewed her MRI results with her and I printed out one more stretch to add into HEP which was reviewed with her and she was taken through these exercises with good overall understanding demonstrated.   OBJECTIVE IMPAIRMENTS: decreased activity tolerance, difficulty walking or standing for long periods, decreased mobility, decreased ROM, decreased strength, impaired flexibility, impaired UE use, postural dysfunction, and pain.  ACTIVITY LIMITATIONS: bending, lifting, carry, locomotion, cleaning, community activity, driving,   PERSONAL FACTORS: PMH: History of (2) lumbar surgeries at the age of 67 and 71 for disc herniation, Ca but in remission, CAD,  are also affecting patient's functional outcome.  REHAB POTENTIAL: Good  CLINICAL DECISION MAKING: Stable/uncomplicated  EVALUATION COMPLEXITY: Low    GOALS: Short term PT Goals Target date: 05/11/2023   Pt will be I and compliant with HEP. Baseline:  Goal status: New Pt will decrease pain by 25% overall to less than 6/10 on avg Baseline: Goal status: New  Long term PT goals Target date:07/06/2023   Pt will improve cervical and lumbar ROM to [facility] >50% to improve functional mobility Baseline: Goal status: New Pt will improve  bilat UE strength to 4+/5 MMT to improve functional strength Baseline: Goal status: New Pt will improve FOTO to at least 59 % functional to show improved function Baseline: Goal status: New Pt will reduce pain by overall 50% overall to less than 4/10  with usual activity Baseline: Goal status: New  PLAN: PT FREQUENCY: 1-2 times per week   PT DURATION: 6-12 weeks  PLANNED INTERVENTIONS (unless contraindicated): aquatic PT, Canalith repositioning, cryotherapy, Electrical stimulation, Iontophoresis with 4 mg/ml dexamethasome, Moist heat, traction, Ultrasound, gait training, Therapeutic exercise, balance training, neuromuscular re-education, patient/family education,  manual techniques, passive ROM, dry  needling, taping, vasopnuematic device, vestibular, spinal manipulations, joint manipulations  PLAN FOR NEXT SESSION: How is HEP and home TENS going? Consider DN or traction combined with stretching and strengthening program to tolerance.    April Manson, PT,DPT 05/04/2023,  4:11 PM

## 2023-05-07 ENCOUNTER — Other Ambulatory Visit: Payer: Self-pay | Admitting: Family Medicine

## 2023-05-07 DIAGNOSIS — Z1231 Encounter for screening mammogram for malignant neoplasm of breast: Secondary | ICD-10-CM

## 2023-05-11 ENCOUNTER — Ambulatory Visit: Payer: Medicare Other | Admitting: Physical Therapy

## 2023-05-11 ENCOUNTER — Encounter: Payer: Self-pay | Admitting: Physical Therapy

## 2023-05-11 DIAGNOSIS — M542 Cervicalgia: Secondary | ICD-10-CM

## 2023-05-11 DIAGNOSIS — M5459 Other low back pain: Secondary | ICD-10-CM

## 2023-05-11 DIAGNOSIS — M546 Pain in thoracic spine: Secondary | ICD-10-CM

## 2023-05-11 DIAGNOSIS — M6281 Muscle weakness (generalized): Secondary | ICD-10-CM | POA: Diagnosis not present

## 2023-05-11 NOTE — Therapy (Signed)
OUTPATIENT PHYSICAL THERAPY TREATMENT   Patient Name: Brenda Lara MRN: [id number] DOB:1951/01/06, 72 y.o., female Today's Date: 05/11/2023  END OF SESSION:  PT End of Session - 05/11/23 1351     Visit Number 3    Number of Visits 10    Date for PT Re-Evaluation 07/06/23    Authorization Type [employer] MCR    PT Start Time 1345    PT Stop Time 1430    PT Time Calculation (min) 45 min    Activity Tolerance Patient tolerated treatment well    Behavior During Therapy WFL for tasks assessed/performed              Past Medical History:  Diagnosis Date   Allergy    Anemia    Angina pectoris    Anxiety    Cataract    Cervical cancer screening 01/31/2016   Menarche at 12 Regular and moderate flow No history of abnormal pap in past G1P1, s/p 1 SVD No history of abnormal MGM No concerns today gyn surgeries b/l tubal ligation and b/y oophorectomy   Colon cancer (HCC) 2010   Coronary artery disease 2001/2005   S/P stenting to RCA and LAD   GERD (gastroesophageal reflux disease)    Hearing loss 08/05/2015   History of chicken pox    HTN (hypertension)    Menopause    Preventative health care 01/31/2016   Pure hypercholesterolemia    Vitamin D deficiency 06/01/2017   Past Surgical History:  Procedure Laterality Date   APPENDECTOMY  02/21/2009   BACK SURGERY  1978, 1988   multiple   BILATERAL OOPHORECTOMY  09/15/2008   CHOLECYSTECTOMY  07/16/2008   COLON SURGERY  09/15/2008   right colectomy   COLONOSCOPY  01/26/2014   NORMAL    CORONARY STENT PLACEMENT  2001/2005   RCA   CYSTECTOMY     left ovary   EYE SURGERY Bilateral 2024   L cataract 2016, R 2024   MELANOMA EXCISION Right 03/05/2022   right lower leg lesion removed   Patient Active Problem List   Diagnosis Date Noted   Sun-damaged skin 04/03/2021   Back pain 04/03/2021   Hepatomegaly 04/03/2021   Myalgia 04/02/2020   Diarrhea 04/02/2020   Left leg pain 11/15/2017   Left arm pain 11/15/2017   Vitamin D  deficiency 06/01/2017   Cervical cancer screening 01/31/2016   Preventative health care 01/31/2016   Hearing loss 08/05/2015   History of chicken pox    Anemia 12/07/2013   Headache 12/09/2012   Abnormal MRI 08/10/2011   Vertigo 05/29/2011   Anxiety    Colon cancer (HCC)    Menopause    Coronary artery disease involving native heart without angina pectoris 06/13/2009   CONGENITAL FACTOR VIII DISORDER 05/30/2009   Atypical chest pain 05/30/2009   HYPERCHOLESTEROLEMIA 12/05/2008   Essential hypertension 12/05/2008   GERD 12/05/2008    PCP: Bradd Canary, MD   REFERRING PROVIDER: Juanda Chance, NP  REFERRING DIAG: M54.2 (ICD-10-CM) - Cervicalgia M54.6,G89.29 (ICD-10-CM) - Chronic bilateral thoracic back pain M54.50,G89.29 (ICD-10-CM) - Chronic bilateral low back pain without sciatica M79.18 (ICD-10-CM) - Myofascial pain syndrome  Rationale for Evaluation and Treatment: Rehabilitation  THERAPY DIAG:  Cervicalgia  Other low back pain  Pain in thoracic spine  Muscle weakness (generalized)  ONSET DATE: chronic pain for many years  SUBJECTIVE:  SUBJECTIVE STATEMENT: 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 She says today she arrives feeling like her muscles are tired and achy but does not describe this as pain.  PERTINENT HISTORY:  PMH: History of (2) lumbar surgeries at the age of 62 and 55 for disc herniation, Ca in remission 10 years, CAD,   PAIN:  Are you having pain? Yes: NPRS scale: she does not rate but says it is not bad today as she has been able to take it easy./10 Pain location: neck, shoulders, down to back Pain description: tight, ache Aggravating factors: housework, lifting, standing, walking Relieving factors: biofreeze, ice, heat  PRECAUTIONS:  None  RED FLAGS: Bowel or bladder incontinence: No   WEIGHT BEARING RESTRICTIONS: No  FALLS:  Has patient fallen in last 6 months? No  PLOF: Independent with basic ADLs  PATIENT GOALS: try to get the pain under contol  NEXT MD VISIT: not sched  OBJECTIVE:   DIAGNOSTIC FINDINGS:  MRI 04/28/23 IMPRESSION: 1. C4-5: Facet arthropathy on the left with some edema. This could be a cause of neck pain. Left-sided foraminal narrowing could affect the left C5 nerve. 2. C5-6: Spondylosis. Bilateral facet osteoarthritis. Foraminal narrowing on the right could affect the C6 nerve. 3. C6-7: Spondylosis. Bilateral foraminal narrowing right more than left that could affect the right C7 nerve.   "XR show degenerative changes and ostopenia"  PATIENT SURVEYS:  Eval: FOTO 52% functional intake, goal is 59%   COGNITION: Overall cognitive status: Within functional limits for tasks assessed     SENSATION: WFL  MUSCLE LENGTH: Hamstrings: very tight bilat Upper traps, very tight bilat  PALPATION: Tender to palpation in upper traps, cervical and lumbar paraspinals bilat  LUMBAR ROM:   AROM eval  Flexion 25%  Extension 10%  Right lateral flexion   Left lateral flexion   Right rotation 25%  Left rotation 25%   (Blank rows = not tested)  Neck ROM:   AROM deg eval  Flexion 20  Extension 30  Right lateral flexion 10  Left lateral flexion 20  Right rotation 45  Left rotation 30   (Blank rows = not tested)   LOWER EXTREMITY MMT:    MMT Right eval Left eval  Hip flexion [facility]  Hip extension    Hip abduction [facility]  Hip adduction    Hip internal rotation    Hip external rotation    Knee flexion [facility] WFL  Knee extension [facility]  Ankle dorsiflexion    Ankle plantarflexion    Ankle inversion    Ankle eversion     (Blank rows = not tested)  Upper EXTREMITY MMT:    MMT Right eval Left eval  Shoulder flexion 4 4  Shoulder  extension 4 4  Shoulder abduction 4 4   Shoulder internal rotation 4+ 4+  Shoulder rotation 4 4  Elbow flexion 4 4  Elbow extension 4 4    SPECIAL TESTS:  Eval: Pain and tightness with SLR test bilat, some relief expressed with manual cervical distraction  FUNCTIONAL TESTS:    Gait  Eval Comments: WFL but pain with prolonged walking  TODAY'S TREATMENT:  05/11/2023 UBE L2 for 5 min switching halfway Mechanical cervical traction 15 min intermittent 13-8# Low trunk rotations 5 sec X 10 bilat Single knee to chest stretch 20 sec X 3 bilat Supine bridges 3 sec X 10 Deadlift with blue band X10, cues for set up and form  05/04/23  Home TENS unit set up and application X 20 minutes with moist heat to bilat cervical P.S and upper traps. Education provided for how to set up and use with tips and precautions of use provided. Levator stretch 30 sec X 2 bilat Upper trap stretch 30 sec X 2 bilat Chin tucks X 10  Scapular retractions X 10 Shoulder extensions red X 5, stopped due to painful crepitus and will remove from HEP Shoulder rows red X 15    PATIENT EDUCATION: Education details: HEP, PT plan of care, TENS therapy Person educated: Patient Education method: Explanation, Demonstration, Verbal cues, and Handouts Education comprehension: verbalized understanding and needs further education   HOME EXERCISE PROGRAM: Access Code: [email or web address] URL: [email or web address] Date: 05/11/2023 Prepared by: Ivery Quale  Exercises - Seated Cervical Retraction  - 2 x daily - 6 x weekly - 1 sets - 10 reps - Seated Assisted Cervical Rotation with Towel  - 2 x daily - 6 x weekly - 1 sets - 10 reps - 5 hold - Seated Cervical Sidebending Stretch  - 2 x daily - 6 x weekly - 3 sets - 15 hold - Standing Shoulder Row with Anchored Resistance  - 2 x daily - 6 x weekly - 2 sets - 10 reps - Lower Trunk Rotations  - 2 x daily - 6 x weekly - 1 sets - 5 reps - 10 hold - Hooklying Single Knee to Chest Stretch  - 2 x daily - 6 x weekly  - 1 sets - 3 reps - 20 hold - Seated Levator Scapulae Stretch  - 2 x daily - 6 x weekly - 2-3 reps - 30 hold - Supine Bridge  - 2 x daily - 6 x weekly - 1-2 sets - 10 reps - 5 hold - Deadlift with Resistance  - 2 x daily - 6 x weekly - 3 sets - 10 reps  Patient Education - TENS UNIT - AUVON Dual Channel TENS Unit  ASSESSMENT:  CLINICAL IMPRESSION: We did try mechanical cervical traction today to see if this helps with her pain management any. I also added in 2 more back strengthening exercises today and provided her with resistance band and pictures of these to add in at home.  OBJECTIVE IMPAIRMENTS: decreased activity tolerance, difficulty walking or standing for long periods, decreased mobility, decreased ROM, decreased strength, impaired flexibility, impaired UE use, postural dysfunction, and pain.  ACTIVITY LIMITATIONS: bending, lifting, carry, locomotion, cleaning, community activity, driving,   PERSONAL FACTORS: PMH: History of (2) lumbar surgeries at the age of 28 and 66 for disc herniation, Ca but in remission, CAD,  are also affecting patient's functional outcome.  REHAB POTENTIAL: Good  CLINICAL DECISION MAKING: Stable/uncomplicated  EVALUATION COMPLEXITY: Low    GOALS: Short term PT Goals Target date: 05/11/2023   Pt will be I and compliant with HEP. Baseline:  Goal status: New Pt will decrease pain by 25% overall to less than 6/10 on avg Baseline: Goal status: New  Long term PT goals Target date:07/06/2023   Pt will improve cervical and lumbar ROM to [facility] >50% to improve functional mobility Baseline: Goal status: New Pt will improve  bilat UE strength to 4+/5 MMT to improve functional strength Baseline: Goal status: New Pt will improve FOTO to at least 59 % functional to show improved function Baseline: Goal status: New Pt will reduce pain by overall 50% overall to less than 4/10  with usual activity Baseline: Goal status: New  PLAN: PT FREQUENCY: 1-2  times per week   PT DURATION: 6-12 weeks  PLANNED INTERVENTIONS (unless contraindicated): aquatic PT, Canalith repositioning, cryotherapy, Electrical stimulation, Iontophoresis with 4 mg/ml dexamethasome, Moist heat, traction, Ultrasound, gait training, Therapeutic exercise, balance training, neuromuscular re-education, patient/family education,  manual techniques, passive ROM, dry needling, taping, vasopnuematic device, vestibular, spinal manipulations, joint manipulations  PLAN FOR NEXT SESSION: how was traction and repeat if desired, consider DN if desired, continue with stretching and strengthening exercises as tolerated.    April Manson, PT,DPT 05/11/2023, 1:52 PM

## 2023-05-14 ENCOUNTER — Other Ambulatory Visit: Payer: Self-pay | Admitting: Family Medicine

## 2023-05-19 ENCOUNTER — Encounter: Payer: Self-pay | Admitting: Physical Therapy

## 2023-05-19 ENCOUNTER — Ambulatory Visit: Payer: Medicare Other | Admitting: Physical Therapy

## 2023-05-19 DIAGNOSIS — M546 Pain in thoracic spine: Secondary | ICD-10-CM | POA: Diagnosis not present

## 2023-05-19 DIAGNOSIS — M5459 Other low back pain: Secondary | ICD-10-CM | POA: Diagnosis not present

## 2023-05-19 DIAGNOSIS — M542 Cervicalgia: Secondary | ICD-10-CM

## 2023-05-19 DIAGNOSIS — M6281 Muscle weakness (generalized): Secondary | ICD-10-CM

## 2023-05-19 NOTE — Therapy (Signed)
OUTPATIENT PHYSICAL THERAPY TREATMENT   Patient Name: Brenda Lara MRN: [id number] DOB:08/26/1951, 72 y.o., female Today's Date: 05/19/2023  END OF SESSION:  PT End of Session - 05/19/23 1407     Visit Number 4    Number of Visits 10    Date for PT Re-Evaluation 07/06/23    Authorization Type [employer] MCR    PT Start Time 1345    PT Stop Time 1425    PT Time Calculation (min) 40 min    Activity Tolerance Patient tolerated treatment well    Behavior During Therapy WFL for tasks assessed/performed              Past Medical History:  Diagnosis Date   Allergy    Anemia    Angina pectoris    Anxiety    Cataract    Cervical cancer screening 01/31/2016   Menarche at 12 Regular and moderate flow No history of abnormal pap in past G1P1, s/p 1 SVD No history of abnormal MGM No concerns today gyn surgeries b/l tubal ligation and b/y oophorectomy   Colon cancer (HCC) 2010   Coronary artery disease 2001/2005   S/P stenting to RCA and LAD   GERD (gastroesophageal reflux disease)    Hearing loss 08/05/2015   History of chicken pox    HTN (hypertension)    Menopause    Preventative health care 01/31/2016   Pure hypercholesterolemia    Vitamin D deficiency 06/01/2017   Past Surgical History:  Procedure Laterality Date   APPENDECTOMY  02/21/2009   BACK SURGERY  1978, 1988   multiple   BILATERAL OOPHORECTOMY  09/15/2008   CHOLECYSTECTOMY  07/16/2008   COLON SURGERY  09/15/2008   right colectomy   COLONOSCOPY  01/26/2014   NORMAL    CORONARY STENT PLACEMENT  2001/2005   RCA   CYSTECTOMY     left ovary   EYE SURGERY Bilateral 2024   L cataract 2016, R 2024   MELANOMA EXCISION Right 03/05/2022   right lower leg lesion removed   Patient Active Problem List   Diagnosis Date Noted   Sun-damaged skin 04/03/2021   Back pain 04/03/2021   Hepatomegaly 04/03/2021   Myalgia 04/02/2020   Diarrhea 04/02/2020   Left leg pain 11/15/2017   Left arm pain 11/15/2017   Vitamin D  deficiency 06/01/2017   Cervical cancer screening 01/31/2016   Preventative health care 01/31/2016   Hearing loss 08/05/2015   History of chicken pox    Anemia 12/07/2013   Headache 12/09/2012   Abnormal MRI 08/10/2011   Vertigo 05/29/2011   Anxiety    Colon cancer (HCC)    Menopause    Coronary artery disease involving native heart without angina pectoris 06/13/2009   CONGENITAL FACTOR VIII DISORDER 05/30/2009   Atypical chest pain 05/30/2009   HYPERCHOLESTEROLEMIA 12/05/2008   Essential hypertension 12/05/2008   GERD 12/05/2008    PCP: Bradd Canary, MD   REFERRING PROVIDER: Juanda Chance, NP  REFERRING DIAG: M54.2 (ICD-10-CM) - Cervicalgia M54.6,G89.29 (ICD-10-CM) - Chronic bilateral thoracic back pain M54.50,G89.29 (ICD-10-CM) - Chronic bilateral low back pain without sciatica M79.18 (ICD-10-CM) - Myofascial pain syndrome  Rationale for Evaluation and Treatment: Rehabilitation  THERAPY DIAG:  Cervicalgia  Other low back pain  Pain in thoracic spine  Muscle weakness (generalized)  ONSET DATE: chronic pain for many years  SUBJECTIVE:  SUBJECTIVE STATEMENT: She states the back extension exercise standing with blue band bothers her back. The rest of the exercises are going good. Pain is not bad today 1-2 oeverall  PERTINENT HISTORY:  PMH: History of (2) lumbar surgeries at the age of 13 and 7 for disc herniation, Ca in remission 10 years, CAD,   PAIN:  Are you having pain? Yes: NPRS scale: 1-2./10 Pain location: neck, shoulders, down to back Pain description: tight, ache Aggravating factors: housework, lifting, standing, walking Relieving factors: biofreeze, ice, heat  PRECAUTIONS: None  RED FLAGS: Bowel or bladder incontinence: No   WEIGHT BEARING RESTRICTIONS:  No  FALLS:  Has patient fallen in last 6 months? No  PLOF: Independent with basic ADLs  PATIENT GOALS: try to get the pain under contol  NEXT MD VISIT: not sched  OBJECTIVE:   DIAGNOSTIC FINDINGS:  MRI 04/28/23 IMPRESSION: 1. C4-5: Facet arthropathy on the left with some edema. This could be a cause of neck pain. Left-sided foraminal narrowing could affect the left C5 nerve. 2. C5-6: Spondylosis. Bilateral facet osteoarthritis. Foraminal narrowing on the right could affect the C6 nerve. 3. C6-7: Spondylosis. Bilateral foraminal narrowing right more than left that could affect the right C7 nerve.   "XR show degenerative changes and ostopenia"  PATIENT SURVEYS:  Eval: FOTO 52% functional intake, goal is 59%   COGNITION: Overall cognitive status: Within functional limits for tasks assessed     SENSATION: WFL  MUSCLE LENGTH: Hamstrings: very tight bilat Upper traps, very tight bilat  PALPATION: Tender to palpation in upper traps, cervical and lumbar paraspinals bilat  LUMBAR ROM:   AROM eval  Flexion 25%  Extension 10%  Right lateral flexion   Left lateral flexion   Right rotation 25%  Left rotation 25%   (Blank rows = not tested)  Neck ROM:   AROM deg eval  Flexion 20  Extension 30  Right lateral flexion 10  Left lateral flexion 20  Right rotation 45  Left rotation 30   (Blank rows = not tested)   LOWER EXTREMITY MMT:    MMT Right eval Left eval  Hip flexion [facility]  Hip extension    Hip abduction [facility]  Hip adduction    Hip internal rotation    Hip external rotation    Knee flexion [facility] WFL  Knee extension [facility] WFl  Ankle dorsiflexion    Ankle plantarflexion    Ankle inversion    Ankle eversion     (Blank rows = not tested)  Upper EXTREMITY MMT:    MMT Right eval Left eval  Shoulder flexion 4 4  Shoulder  extension 4 4  Shoulder abduction 4 4  Shoulder internal rotation 4+ 4+  Shoulder rotation 4 4  Elbow flexion 4 4  Elbow  extension 4 4    SPECIAL TESTS:  Eval: Pain and tightness with SLR test bilat, some relief expressed with manual cervical distraction  FUNCTIONAL TESTS:    Gait  Eval Comments: WFL but pain with prolonged walking  TODAY'S TREATMENT:  05/19/2023 UBE L2 for 6 min switching halfway Nu step UE/LE L5 X 5 min Seated lumbar extension isometrics into pillow that was behind her back in chair 5 sec X 10 Cervical rotation stretch with towel 5 sec X 10 bilat Cervical extension/flexion with towel pulling forward mob with movement X 10 Low trunk rotations 5 sec X 10 bilat Single knee to chest stretch 15 sec X 3 bilat Supine bridges 3 sec X  10 HEP revision and review  05/11/2023 UBE L2 for 5 min switching halfway Mechanical cervical traction 15 min intermittent 13-8# Low trunk rotations 5 sec X 10 bilat Single knee to chest stretch 20 sec X 3 bilat Supine bridges 3 sec X 10 Deadlift with blue band X10, cues for set up and form  05/04/23 Home TENS unit set up and application X 20 minutes with moist heat to bilat cervical P.S and upper traps. Education provided for how to set up and use with tips and precautions of use provided. Levator stretch 30 sec X 2 bilat Upper trap stretch 30 sec X 2 bilat Chin tucks X 10  Scapular retractions X 10 Shoulder extensions red X 5, stopped due to painful crepitus and will remove from HEP Shoulder rows red X 15    PATIENT EDUCATION: Education details: HEP, PT plan of care, TENS therapy Person educated: Patient Education method: Explanation, Demonstration, Verbal cues, and Handouts Education comprehension: verbalized understanding and needs further education   HOME EXERCISE PROGRAM: Access Code: [email or web address] URL: [email or web address] Date: 05/19/2023 Prepared by: Ivery Quale  Exercises - Seated Assisted Cervical Rotation with Towel  - 2 x daily - 6 x weekly - 1 sets - 10 reps - 5 hold - Mid-Lower Cervical Extension SNAG with Strap  -  2 x daily - 6 x weekly - 1 sets - 10 reps - Seated Cervical Sidebending Stretch  - 2 x daily - 6 x weekly - 3 sets - 15 hold - Seated Levator Scapulae Stretch  - 2 x daily - 6 x weekly - 3 reps - 15 hold - Seated Thoracic Lumbar Extension  - 2 x daily - 6 x weekly - 1 sets - 10 reps - 5 sec hold - Lower Trunk Rotations  - 2 x daily - 6 x weekly - 1 sets - 5 reps - 10 hold - Hooklying Single Knee to Chest Stretch  - 2 x daily - 6 x weekly - 1 sets - 3 reps - 20 hold - Supine Bridge  - 2 x daily - 6 x weekly - 1-2 sets - 10 reps - 5 hold    Patient Education - TENS UNIT - AUVON Dual Channel TENS Unit  ASSESSMENT:  CLINICAL IMPRESSION: Traction held today as she did not notice any difference after and pain was overall low today. The shoulder rows and extensions caused pain and crepitus so I discharged those from HEP. She also had pain with standing deadlift exercise to I also discharged that one and substituted for seated back extension isometric and she had good tolerance to that one and good understanding of HEP revisions today. I gave her new print out.   OBJECTIVE IMPAIRMENTS: decreased activity tolerance, difficulty walking or standing for long periods, decreased mobility, decreased ROM, decreased strength, impaired flexibility, impaired UE use, postural dysfunction, and pain.  ACTIVITY LIMITATIONS: bending, lifting, carry, locomotion, cleaning, community activity, driving,   PERSONAL FACTORS: PMH: History of (2) lumbar surgeries at the age of 17 and 78 for disc herniation, Ca but in remission, CAD,  are also affecting patient's functional outcome.  REHAB POTENTIAL: Good  CLINICAL DECISION MAKING: Stable/uncomplicated  EVALUATION COMPLEXITY: Low    GOALS: Short term PT Goals Target date: 05/11/2023   Pt will be I and compliant with HEP. Baseline:  Goal status: New Pt will decrease pain by 25% overall to less than 6/10 on avg Baseline: Goal status: New  Long term PT goals  Target date:07/06/2023  Pt will improve cervical and lumbar ROM to [place] >50% to improve functional mobility Baseline: Goal status: New Pt will improve  bilat UE strength to 4+/5 MMT to improve functional strength Baseline: Goal status: New Pt will improve FOTO to at least 59 % functional to show improved function Baseline: Goal status: New Pt will reduce pain by overall 50% overall to less than 4/10  with usual activity Baseline: Goal status: New  PLAN: PT FREQUENCY: 1-2 times per week   PT DURATION: 6-12 weeks  PLANNED INTERVENTIONS (unless contraindicated): aquatic PT, Canalith repositioning, cryotherapy, Electrical stimulation, Iontophoresis with 4 mg/ml dexamethasome, Moist heat, traction, Ultrasound, gait training, Therapeutic exercise, balance training, neuromuscular re-education, patient/family education,  manual techniques, passive ROM, dry needling, taping, vasopnuematic device, vestibular, spinal manipulations, joint manipulations  PLAN FOR NEXT SESSION: consider DN if desired, continue with stretching and strengthening exercises as tolerated.    April Manson, PT,DPT 05/19/2023, 2:09 PM

## 2023-05-20 ENCOUNTER — Other Ambulatory Visit: Payer: Self-pay | Admitting: Family Medicine

## 2023-05-20 MED ORDER — MECLIZINE HCL 25 MG PO TABS: 25.0000 mg | ORAL_TABLET | Freq: Three times a day (TID) | ORAL | 0 refills | Status: DC | PRN

## 2023-05-25 ENCOUNTER — Encounter: Payer: Self-pay | Admitting: Physical Therapy

## 2023-05-25 ENCOUNTER — Ambulatory Visit: Payer: Medicare Other | Admitting: Physical Therapy

## 2023-05-25 DIAGNOSIS — M6281 Muscle weakness (generalized): Secondary | ICD-10-CM

## 2023-05-25 DIAGNOSIS — M5459 Other low back pain: Secondary | ICD-10-CM | POA: Diagnosis not present

## 2023-05-25 DIAGNOSIS — M546 Pain in thoracic spine: Secondary | ICD-10-CM

## 2023-05-25 DIAGNOSIS — M542 Cervicalgia: Secondary | ICD-10-CM

## 2023-05-25 NOTE — Therapy (Signed)
OUTPATIENT PHYSICAL THERAPY TREATMENT   Patient Name: Brenda Lara MRN: [id number] DOB:[birth year], 72 y.o., female Today's Date: 05/25/2023  END OF SESSION:  PT End of Session - 05/25/23 1400     Visit Number 5    Number of Visits 10    Date for PT Re-Evaluation 07/06/23    Authorization Type UHC MCR    PT Start Time 1349    PT Stop Time 1430    PT Time Calculation (min) 41 min    Activity Tolerance Patient tolerated treatment well    Behavior During Therapy WFL for tasks assessed/performed              Past Medical History:  Diagnosis Date   Allergy    Anemia    Angina pectoris    Anxiety    Cataract    Cervical cancer screening 01/31/2016   Menarche at 12 Regular and moderate flow No history of abnormal pap in past G1P1, s/p 1 SVD No history of abnormal MGM No concerns today gyn surgeries b/l tubal ligation and b/y oophorectomy   Colon cancer (HCC) 2010   Coronary artery disease 2001/2005   S/P stenting to RCA and LAD   GERD (gastroesophageal reflux disease)    Hearing loss 08/05/2015   History of chicken pox    HTN (hypertension)    Menopause    Preventative health care 01/31/2016   Pure hypercholesterolemia    Vitamin D deficiency 06/01/2017   Past Surgical History:  Procedure Laterality Date   APPENDECTOMY  02/21/2009   BACK SURGERY  1978, 1988   multiple   BILATERAL OOPHORECTOMY  09/15/2008   CHOLECYSTECTOMY  07/16/2008   COLON SURGERY  09/15/2008   right colectomy   COLONOSCOPY  01/26/2014   NORMAL    CORONARY STENT PLACEMENT  2001/2005   RCA   CYSTECTOMY     left ovary   EYE SURGERY Bilateral 2024   L cataract 2016, R 2024   MELANOMA EXCISION Right 03/05/2022   right lower leg lesion removed   Patient Active Problem List   Diagnosis Date Noted   Sun-damaged skin 04/03/2021   Back pain 04/03/2021   Hepatomegaly 04/03/2021   Myalgia 04/02/2020   Diarrhea 04/02/2020   Left leg pain 11/15/2017   Left arm pain 11/15/2017   Vitamin D  deficiency 06/01/2017   Cervical cancer screening 01/31/2016   Preventative health care 01/31/2016   Hearing loss 08/05/2015   History of chicken pox    Anemia 12/07/2013   Headache 12/09/2012   Abnormal MRI 08/10/2011   Vertigo 05/29/2011   Anxiety    Colon cancer (HCC)    Menopause    Coronary artery disease involving native heart without angina pectoris 06/13/2009   CONGENITAL FACTOR VIII DISORDER 05/30/2009   Atypical chest pain 05/30/2009   HYPERCHOLESTEROLEMIA 12/05/2008   Essential hypertension 12/05/2008   GERD 12/05/2008    PCP: Bradd Canary, MD   REFERRING PROVIDER: Juanda Chance, NP  REFERRING DIAG: M54.2 (ICD-10-CM) - Cervicalgia M54.6,G89.29 (ICD-10-CM) - Chronic bilateral thoracic back pain M54.50,G89.29 (ICD-10-CM) - Chronic bilateral low back pain without sciatica M79.18 (ICD-10-CM) - Myofascial pain syndrome  Rationale for Evaluation and Treatment: Rehabilitation  THERAPY DIAG:  Cervicalgia  Other low back pain  Pain in thoracic spine  Muscle weakness (generalized)  ONSET DATE: chronic pain for many years  SUBJECTIVE:  SUBJECTIVE STATEMENT: She states she has had a bad headache today, her neck was very stiff this morning too when she woke up. She does consent to try DN today  PERTINENT HISTORY:  PMH: History of (2) lumbar surgeries at the age of 58 and 60 for disc herniation, Ca in remission 10 years, CAD,   PAIN:  Are you having pain? Yes: NPRS scale: 3./10 Pain location: neck, shoulders, down to back Pain description: tight, ache Aggravating factors: housework, lifting, standing, walking Relieving factors: biofreeze, ice, heat  PRECAUTIONS: None  RED FLAGS: Bowel or bladder incontinence: No   WEIGHT BEARING RESTRICTIONS: No  FALLS:  Has patient  fallen in last 6 months? No  PLOF: Independent with basic ADLs  PATIENT GOALS: try to get the pain under contol  NEXT MD VISIT: not sched  OBJECTIVE:   DIAGNOSTIC FINDINGS:  MRI 04/28/23 IMPRESSION: 1. C4-5: Facet arthropathy on the left with some edema. This could be a cause of neck pain. Left-sided foraminal narrowing could affect the left C5 nerve. 2. C5-6: Spondylosis. Bilateral facet osteoarthritis. Foraminal narrowing on the right could affect the C6 nerve. 3. C6-7: Spondylosis. Bilateral foraminal narrowing right more than left that could affect the right C7 nerve.   "XR show degenerative changes and ostopenia"  PATIENT SURVEYS:  Eval: FOTO 52% functional intake, goal is 59%   COGNITION: Overall cognitive status: Within functional limits for tasks assessed     SENSATION: WFL  MUSCLE LENGTH: Hamstrings: very tight bilat Upper traps, very tight bilat  PALPATION: Tender to palpation in upper traps, cervical and lumbar paraspinals bilat  LUMBAR ROM:   AROM eval  Flexion 25%  Extension 10%  Right lateral flexion   Left lateral flexion   Right rotation 25%  Left rotation 25%   (Blank rows = not tested)  Neck ROM:   AROM deg eval  Flexion 20  Extension 30  Right lateral flexion 10  Left lateral flexion 20  Right rotation 45  Left rotation 30   (Blank rows = not tested)   LOWER EXTREMITY MMT:    MMT Right eval Left eval  Hip flexion [facility]  Hip extension    Hip abduction [facility]  Hip adduction    Hip internal rotation    Hip external rotation    Knee flexion [facility] WFL  Knee extension [facility] WFl  Ankle dorsiflexion    Ankle plantarflexion    Ankle inversion    Ankle eversion     (Blank rows = not tested)  Upper EXTREMITY MMT:    MMT Right eval Left eval  Shoulder flexion 4 4  Shoulder  extension 4 4  Shoulder abduction 4 4  Shoulder internal rotation 4+ 4+  Shoulder rotation 4 4  Elbow flexion 4 4  Elbow extension 4 4     SPECIAL TESTS:  Eval: Pain and tightness with SLR test bilat, some relief expressed with manual cervical distraction  FUNCTIONAL TESTS:    Gait  Eval Comments: WFL but pain with prolonged walking  TODAY'S TREATMENT:  05/25/2023 Nu step UE/LE L5 X 8 min Seated lumbar extension isometrics into pillow that was behind her back in chair 5 sec X 15 Cervical rotation stretch with towel 5 sec X 10 bilat Cervical extension/flexion with towel pulling forward mob with movement X 10 Low trunk rotations 5 sec X 10 bilat Single knee to chest stretch 20 sec X 3 bilat Supine bridges 5 sec X 15  Manual therapy for active compression  and skilled palpation and Trigger Point Dry-Needling  Treatment instructions: Expect mild to moderate muscle soreness. Patient Consent Given: Yes Education handout provided: YES Muscles treated: Rt sided Cervical paraspinals and multifidi, upper traps, levator Treatment response/outcome: fair overall tolerance,twitch response noted    05/19/2023 UBE L2 for 6 min switching halfway Nu step UE/LE L5 X 5 min Seated lumbar extension isometrics into pillow that was behind her back in chair 5 sec X 10 Cervical rotation stretch with towel 5 sec X 10 bilat Cervical extension/flexion with towel pulling forward mob with movement X 10 Low trunk rotations 5 sec X 10 bilat Single knee to chest stretch 15 sec X 3 bilat Supine bridges 3 sec X 10 HEP revision and review  05/11/2023 UBE L2 for 5 min switching halfway Mechanical cervical traction 15 min intermittent 13-8# Low trunk rotations 5 sec X 10 bilat Single knee to chest stretch 20 sec X 3 bilat Supine bridges 3 sec X 10 Deadlift with blue band X10, cues for set up and form  05/04/23 Home TENS unit set up and application X 20 minutes with moist heat to bilat cervical P.S and upper traps. Education provided for how to set up and use with tips and precautions of use provided. Levator stretch 30 sec X 2 bilat Upper  trap stretch 30 sec X 2 bilat Chin tucks X 10  Scapular retractions X 10 Shoulder extensions red X 5, stopped due to painful crepitus and will remove from HEP Shoulder rows red X 15    PATIENT EDUCATION: Education details: HEP, PT plan of care, TENS therapy Person educated: Patient Education method: Explanation, Demonstration, Verbal cues, and Handouts Education comprehension: verbalized understanding and needs further education   HOME EXERCISE PROGRAM: Access Code: [email or web address] URL: [email or web address] Date: 05/19/2023 Prepared by: Ivery Quale  Exercises - Seated Assisted Cervical Rotation with Towel  - 2 x daily - 6 x weekly - 1 sets - 10 reps - 5 hold - Mid-Lower Cervical Extension SNAG with Strap  - 2 x daily - 6 x weekly - 1 sets - 10 reps - Seated Cervical Sidebending Stretch  - 2 x daily - 6 x weekly - 3 sets - 15 hold - Seated Levator Scapulae Stretch  - 2 x daily - 6 x weekly - 3 reps - 15 hold - Seated Thoracic Lumbar Extension  - 2 x daily - 6 x weekly - 1 sets - 10 reps - 5 sec hold - Lower Trunk Rotations  - 2 x daily - 6 x weekly - 1 sets - 5 reps - 10 hold - Hooklying Single Knee to Chest Stretch  - 2 x daily - 6 x weekly - 1 sets - 3 reps - 20 hold - Supine Bridge  - 2 x daily - 6 x weekly - 1-2 sets - 10 reps - 5 hold    Patient Education - TENS UNIT - AUVON Dual Channel TENS Unit  ASSESSMENT:  CLINICAL IMPRESSION: She was having more pain and tightness upon waking today in Rt side of her neck. I did try DN to this area as we have not yet tried this. We will assess her response from this next visit. She had no adverse response during or immediately after.   OBJECTIVE IMPAIRMENTS: decreased activity tolerance, difficulty walking or standing for long periods, decreased mobility, decreased ROM, decreased strength, impaired flexibility, impaired UE use, postural dysfunction, and pain.  ACTIVITY LIMITATIONS: bending, lifting, carry, locomotion,  cleaning, community activity, driving,  PERSONAL FACTORS: PMH: History of (2) lumbar surgeries at the age of 75 and 72 for disc herniation, Ca but in remission, CAD,  are also affecting patient's functional outcome.  REHAB POTENTIAL: Good  CLINICAL DECISION MAKING: Stable/uncomplicated  EVALUATION COMPLEXITY: Low    GOALS: Short term PT Goals Target date: 05/11/2023   Pt will be I and compliant with HEP. Baseline:  Goal status: New Pt will decrease pain by 25% overall to less than 6/10 on avg Baseline: Goal status: New  Long term PT goals Target date:07/06/2023   Pt will improve cervical and lumbar ROM to [facility] >50% to improve functional mobility Baseline: Goal status: New Pt will improve  bilat UE strength to 4+/5 MMT to improve functional strength Baseline: Goal status: New Pt will improve FOTO to at least 59 % functional to show improved function Baseline: Goal status: New Pt will reduce pain by overall 50% overall to less than 4/10  with usual activity Baseline: Goal status: New  PLAN: PT FREQUENCY: 1-2 times per week   PT DURATION: 6-12 weeks  PLANNED INTERVENTIONS (unless contraindicated): aquatic PT, Canalith repositioning, cryotherapy, Electrical stimulation, Iontophoresis with 4 mg/ml dexamethasome, Moist heat, traction, Ultrasound, gait training, Therapeutic exercise, balance training, neuromuscular re-education, patient/family education,  manual techniques, passive ROM, dry needling, taping, vasopnuematic device, vestibular, spinal manipulations, joint manipulations  PLAN FOR NEXT SESSION: how was DN? continue with stretching and strengthening exercises as tolerated.    April Manson, PT,DPT 05/25/2023, 2:01 PM

## 2023-06-01 ENCOUNTER — Encounter: Payer: Medicare Other | Admitting: Physical Therapy

## 2023-06-01 ENCOUNTER — Encounter: Payer: Self-pay | Admitting: Physical Therapy

## 2023-06-01 ENCOUNTER — Ambulatory Visit: Payer: Medicare Other | Admitting: Physical Therapy

## 2023-06-01 DIAGNOSIS — M542 Cervicalgia: Secondary | ICD-10-CM | POA: Diagnosis not present

## 2023-06-01 DIAGNOSIS — M546 Pain in thoracic spine: Secondary | ICD-10-CM | POA: Diagnosis not present

## 2023-06-01 DIAGNOSIS — M5459 Other low back pain: Secondary | ICD-10-CM | POA: Diagnosis not present

## 2023-06-01 DIAGNOSIS — M6281 Muscle weakness (generalized): Secondary | ICD-10-CM

## 2023-06-01 DIAGNOSIS — G43719 Chronic migraine without aura, intractable, without status migrainosus: Secondary | ICD-10-CM | POA: Diagnosis not present

## 2023-06-01 NOTE — Therapy (Addendum)
OUTPATIENT PHYSICAL THERAPY TREATMENT  / DISCHARGE   Patient Name: Brenda Lara MRN: [id number] DOB:Aug 16, 1951, 72 y.o., female Today's Date: 06/01/2023  END OF SESSION:  PT End of Session - 06/01/23 0919     Visit Number 6    Number of Visits 10    Date for PT Re-Evaluation 07/06/23    Authorization Type UHC MCR    PT Start Time 0935    PT Stop Time 1020    PT Time Calculation (min) 45 min    Activity Tolerance Patient tolerated treatment well    Behavior During Therapy WFL for tasks assessed/performed              Past Medical History:  Diagnosis Date   Allergy    Anemia    Angina pectoris    Anxiety    Cataract    Cervical cancer screening 01/31/2016   Menarche at 12 Regular and moderate flow No history of abnormal pap in past G1P1, s/p 1 SVD No history of abnormal MGM No concerns today gyn surgeries b/l tubal ligation and b/y oophorectomy   Colon cancer (HCC) 2010   Coronary artery disease 2001/2005   S/P stenting to RCA and LAD   GERD (gastroesophageal reflux disease)    Hearing loss 08/05/2015   History of chicken pox    HTN (hypertension)    Menopause    Preventative health care 01/31/2016   Pure hypercholesterolemia    Vitamin D deficiency 06/01/2017   Past Surgical History:  Procedure Laterality Date   APPENDECTOMY  02/21/2009   BACK SURGERY  1978, 1988   multiple   BILATERAL OOPHORECTOMY  09/15/2008   CHOLECYSTECTOMY  07/16/2008   COLON SURGERY  09/15/2008   right colectomy   COLONOSCOPY  01/26/2014   NORMAL    CORONARY STENT PLACEMENT  2001/2005   RCA   CYSTECTOMY     left ovary   EYE SURGERY Bilateral 2024   L cataract 2016, R 2024   MELANOMA EXCISION Right 03/05/2022   right lower leg lesion removed   Patient Active Problem List   Diagnosis Date Noted   Sun-damaged skin 04/03/2021   Back pain 04/03/2021   Hepatomegaly 04/03/2021   Myalgia 04/02/2020   Diarrhea 04/02/2020   Left leg pain 11/15/2017   Left arm pain 11/15/2017    Vitamin D deficiency 06/01/2017   Cervical cancer screening 01/31/2016   Preventative health care 01/31/2016   Hearing loss 08/05/2015   History of chicken pox    Anemia 12/07/2013   Headache 12/09/2012   Abnormal MRI 08/10/2011   Vertigo 05/29/2011   Anxiety    Colon cancer (HCC)    Menopause    Coronary artery disease involving native heart without angina pectoris 06/13/2009   CONGENITAL FACTOR VIII DISORDER 05/30/2009   Atypical chest pain 05/30/2009   HYPERCHOLESTEROLEMIA 12/05/2008   Essential hypertension 12/05/2008   GERD 12/05/2008    PCP: Bradd Canary, MD   REFERRING PROVIDER: Juanda Chance, NP  REFERRING DIAG: M54.2 (ICD-10-CM) - Cervicalgia M54.6,G89.29 (ICD-10-CM) - Chronic bilateral thoracic back pain M54.50,G89.29 (ICD-10-CM) - Chronic bilateral low back pain without sciatica M79.18 (ICD-10-CM) - Myofascial pain syndrome  Rationale for Evaluation and Treatment: Rehabilitation  THERAPY DIAG:  Cervicalgia  Other low back pain  Pain in thoracic spine  Muscle weakness (generalized)  ONSET DATE: chronic pain for many years  SUBJECTIVE:  SUBJECTIVE STATEMENT: She states she was very sore after the DN but does feel that it helped. She still has one spot that is still sore from it so defers that treatment today.   PERTINENT HISTORY:  PMH: History of (2) lumbar surgeries at the age of 38 and 50 for disc herniation, Ca in remission 10 years, CAD,   PAIN:  Are you having pain? Yes: NPRS scale: 2-3 overall today/10 Pain location: neck, shoulders, down to back Pain description: tight, ache Aggravating factors: housework, lifting, standing, walking Relieving factors: biofreeze, ice, heat  PRECAUTIONS: None  RED FLAGS: Bowel or bladder incontinence: No   WEIGHT  BEARING RESTRICTIONS: No  FALLS:  Has patient fallen in last 6 months? No  PLOF: Independent with basic ADLs  PATIENT GOALS: try to get the pain under contol  NEXT MD VISIT: not sched  OBJECTIVE:   DIAGNOSTIC FINDINGS:  MRI 04/28/23 IMPRESSION: 1. C4-5: Facet arthropathy on the left with some edema. This could be a cause of neck pain. Left-sided foraminal narrowing could affect the left C5 nerve. 2. C5-6: Spondylosis. Bilateral facet osteoarthritis. Foraminal narrowing on the right could affect the C6 nerve. 3. C6-7: Spondylosis. Bilateral foraminal narrowing right more than left that could affect the right C7 nerve.   "XR show degenerative changes and ostopenia"  PATIENT SURVEYS:  Eval: FOTO 52% functional intake, goal is 59%   COGNITION: Overall cognitive status: Within functional limits for tasks assessed     SENSATION: WFL  MUSCLE LENGTH: Hamstrings: very tight bilat Upper traps, very tight bilat  PALPATION: Tender to palpation in upper traps, cervical and lumbar paraspinals bilat  LUMBAR ROM:   AROM eval 06/01/23  Flexion 25% 60%  Extension 10% 50%  Right lateral flexion    Left lateral flexion    Right rotation 25% 75%  Left rotation 25% 75%   (Blank rows = not tested)  Neck ROM:   AROM deg eval 06/01/23  Flexion 20 30  Extension 30 40  Right lateral flexion 10 25  Left lateral flexion 20 30  Right rotation 45 80  Left rotation 30 70   (Blank rows = not tested)   LOWER EXTREMITY MMT:    MMT Right eval Left eval  Hip flexion [facility]  Hip extension    Hip abduction [facility]  Hip adduction    Hip internal rotation    Hip external rotation    Knee flexion [facility] WFL  Knee extension [facility]  Ankle dorsiflexion    Ankle plantarflexion    Ankle inversion    Ankle eversion     (Blank rows = not tested)  Upper EXTREMITY MMT:    MMT Right eval Left eval Rt/Left 06/01/23  Shoulder flexion 4 4 4+/4+  Shoulder  extension 4 4   Shoulder  abduction 4 4 4+/4+  Shoulder internal rotation 4+ 4+ 5/5  Shoulder External rotation 4 4 4/4+  Elbow flexion 4 4 5/5  Elbow extension 4 4 5/5    SPECIAL TESTS:  Eval: Pain and tightness with SLR test bilat, some relief expressed with manual cervical distraction  FUNCTIONAL TESTS:    Gait  Eval Comments: WFL but pain with prolonged walking  TODAY'S TREATMENT:  06/01/2023 Nu step UE/LE L5 X 8 min Seated lumbar extension isometrics into pillow that was behind her back in chair 5 sec X 15 Cervical rotation stretch with towel 5 sec X 10 bilat Cervical extension/flexion with towel pulling forward mob with movement X 10  Leg press machine 50# [id number] Chest press machine 5# [id number] Row machine 10# [id number] Knee extension machine 5# [id number]  04/18/6961 Nu step UE/LE L5 X 8 min Seated lumbar extension isometrics into pillow that was behind her back in chair 5 sec X 15 Cervical rotation stretch with towel 5 sec X 10 bilat Cervical extension/flexion with towel pulling forward mob with movement X 10 Low trunk rotations 5 sec X 10 bilat Single knee to chest stretch 20 sec X 3 bilat Supine bridges 5 sec X 15  Manual therapy for active compression and skilled palpation and Trigger Point Dry-Needling  Treatment instructions: Expect mild to moderate muscle soreness. Patient Consent Given: Yes Education handout provided: YES Muscles treated: Rt sided Cervical paraspinals and multifidi, upper traps, levator Treatment response/outcome: fair overall tolerance,twitch response noted    05/19/2023 UBE L2 for 6 min switching halfway Nu step UE/LE L5 X 5 min Seated lumbar extension isometrics into pillow that was behind her back in chair 5 sec X 10 Cervical rotation stretch with towel 5 sec X 10 bilat Cervical extension/flexion with towel pulling forward mob with movement X 10 Low trunk rotations 5 sec X 10 bilat Single knee to chest stretch 15 sec X 3 bilat Supine bridges 3 sec X 10 HEP revision and  review    PATIENT EDUCATION: Education details: HEP, PT plan of care, TENS therapy Person educated: Patient Education method: Explanation, Demonstration, Verbal cues, and Handouts Education comprehension: verbalized understanding and needs further education   HOME EXERCISE PROGRAM: Access Code: [email or web address] URL: [email or web address] Date: 05/19/2023 Prepared by: Ivery Quale  Exercises - Seated Assisted Cervical Rotation with Towel  - 2 x daily - 6 x weekly - 1 sets - 10 reps - 5 hold - Mid-Lower Cervical Extension SNAG with Strap  - 2 x daily - 6 x weekly - 1 sets - 10 reps - Seated Cervical Sidebending Stretch  - 2 x daily - 6 x weekly - 3 sets - 15 hold - Seated Levator Scapulae Stretch  - 2 x daily - 6 x weekly - 3 reps - 15 hold - Seated Thoracic Lumbar Extension  - 2 x daily - 6 x weekly - 1 sets - 10 reps - 5 sec hold - Lower Trunk Rotations  - 2 x daily - 6 x weekly - 1 sets - 5 reps - 10 hold - Hooklying Single Knee to Chest Stretch  - 2 x daily - 6 x weekly - 1 sets - 3 reps - 20 hold - Supine Bridge  - 2 x daily - 6 x weekly - 1-2 sets - 10 reps - 5 hold  Access Code: [email or web address] URL: [email or web address] Date: 06/01/2023 Prepared by: Ivery Quale  Exercises - Standing March at [facility]  - 1 x daily - 2 x weekly - 15 reps - Standing Hip Flexion Extension at [employer]  - 1 x daily - 2 x weekly - 15 reps - Standing Hip Abduction Adduction at [employer]  - 1 x daily - 2 x weekly - 20 reps - Forward Walking  - 1 x daily - 2 x weekly - 4 sets - Backward Walking  - 1 x daily - 2 x weekly - 4 sets - Side Stepping  - 2 x daily - 2 x weekly - 4 sets - 10 reps - Standing Knee Flexion  - 2 x daily - 2 x weekly - 3 sets - 10 reps - Squat  - 1 x  daily - 2 x weekly - 15 reps - Lunge to [employer] at [employer]  - 1 x daily - 2 x weekly - 1 sets - 20 reps - soothing, single arm horizontal add/abd yoga  - 1 x daily - 2 x weekly - 20 reps - feeing, lunge stance  with thoracic open/close book stretch  - 1 x daily - 2 x weekly - 20 reps    Patient Education - TENS UNIT - AUVON Dual Channel TENS Unit  ASSESSMENT:  CLINICAL IMPRESSION: She has made progress with pain, ROM, and strength with updated measurements and subjective reports. She does still endorse chronic pain that limits how long she can spend on certain activities like cooking, yardwork, and housework. This was the last visit she had scheduled with [place] and at this point we will hold PT and she will trial HEP and she plans to start going to the [facility] as well. I did show her additional gym equipment that would be safe and effective for her and printed out aquatic HEP as well. She will follow up within 30 days if needed, if no return will DC after 30 days.  OBJECTIVE IMPAIRMENTS: decreased activity tolerance, difficulty walking or standing for long periods, decreased mobility, decreased ROM, decreased strength, impaired flexibility, impaired UE use, postural dysfunction, and pain.  ACTIVITY LIMITATIONS: bending, lifting, carry, locomotion, cleaning, community activity, driving,   PERSONAL FACTORS: PMH: History of (2) lumbar surgeries at the age of 62 and 20 for disc herniation, Ca but in remission, CAD,  are also affecting patient's functional outcome.  REHAB POTENTIAL: Good  CLINICAL DECISION MAKING: Stable/uncomplicated  EVALUATION COMPLEXITY: Low    GOALS: Short term PT Goals Target date: 05/11/2023   Pt will be I and compliant with HEP. Baseline:  Goal status: MET 05/19/23 Pt will decrease pain by 25% overall to less than 6/10 on avg Baseline: Goal status: MET 05/19/23  Long term PT goals Target date:07/06/2023   Pt will improve cervical and lumbar ROM to [facility] >50% to improve functional mobility Baseline: Goal status: MET 05/31/23 Pt will improve  bilat UE strength to 4+/5 MMT to improve functional strength Baseline: Goal status: MET 06/01/23 Pt will improve FOTO to at least 59 %  functional to show improved function Baseline: Goal status: not met, 50% currently 06/01/23 Pt will reduce pain by overall 50% overall to less than 4/10  with usual activity Baseline: Goal status: MET 06/01/23  PLAN: PT FREQUENCY: 1-2 times per week   PT DURATION: 6-12 weeks  PLANNED INTERVENTIONS (unless contraindicated): aquatic PT, Canalith repositioning, cryotherapy, Electrical stimulation, Iontophoresis with 4 mg/ml dexamethasome, Moist heat, traction, Ultrasound, gait training, Therapeutic exercise, balance training, neuromuscular re-education, patient/family education,  manual techniques, passive ROM, dry needling, taping, vasopnuematic device, vestibular, spinal manipulations, joint manipulations  PLAN FOR NEXT SESSION: Trial HEP and She will follow up within 30 days if needed, if no return will DC after 30 days.   April Manson, PT,DPT 06/01/2023, 10:31 AM  PHYSICAL THERAPY DISCHARGE SUMMARY  Visits from Start of Care: 6  Current functional level related to goals / functional outcomes: See note   Remaining deficits: See note   Education / Equipment: HEP  Patient goals were  mostly met . Patient is being discharged due to not returning since the last visit.  Chyrel Masson, PT, DPT, OCS, ATC 07/15/23  10:48 AM

## 2023-06-02 NOTE — Progress Notes (Signed)
Cardiology Office Note:    Date:  06/05/2023   ID:  Brenda Lara, DOB 03-19-1951, MRN [id number]  PCP:  Bradd Canary, MD   [facility] Providers Cardiologist:  Charlton Haws, MD     Referring MD: Bradd Canary, MD   Chief Complaint:   History of Present Illness:    Brenda Lara is a very pleasant 72 y.o. female with a hx of CAD s/p stenting of RCA in 2006 and LAD in 2005, chronic back pain, white coat hypertension, hyperlipidemia, and colon cancer  Last cath in 2010 revealed widely patent stents.  Exercise treadmill test 07/10/20 was normal.  Statin changed to Lipitor in February 2019 with target LDL reached.  Colon cancer in remission post colectomy.  Prior history of EF 55% with some apical hypokinesis but no recent echocardiogram.  She has maintained consistent follow-up for many years. Seen by Dr. Eden Emms on 06/25/21 at which time no changes were made to her treatment plan and she was advised to return in 1 year for follow-up.  Seen in clinic by me on 07/10/22 for follow-up. Reported overall doing well. Has a lot of neck and back and shoulder issues and sometimes wonders if she is unaware of chest pain. Bilateral arm pain was previous symptom of angina at age 89 when she had initial stent to LAD. Had passed multiple stress tests prior to that and finally underwent cardiac cath which revealed 80% blockage in LAD.  Was having a nuclear stress test in 2006 when blockage to RCA was noted. She was asymptomatic at that time. Doing chair exercise program at church. Walking does not aggravate her neck and back pain but she is no longer walking on a consistent basis. No chest pain, shortness of breath, arm pain, or other concerns with activity. Occasionally gets dizzy when bending pulling weeds. Home BP typically 118-130 over 62. Secondary prevention was encouraged, including increased physical activity, low sodium, heart healthy diet. No medication changes were made.  Seen in  follow-up by Dr. Eden Emms on 01/12/23 at which time she reported headaches from prior head trauma and cataract surgery.  LDL was at goal on Lipitor. She reported some myalgias but was encouraged to r/o lumbar stenosis. No specific cardiac concerns noted and 1 year f/u recommended.   Seen by me on 03/31/23 for evaluation of chest and back pain similar to prior angina. On July 8, she started to have pain in left chest, pains occurring every few minutes. Lasted for a few hours. Felt pressure in her arms. Angina prior to both stents, was arm pressure she never had chest pain. Delivers meals on wheels twice weekly. Noticed she was tired pushing the cart of food trays and felt some weakness afterwards. Typically has white coat hypertension. Home BP is well controlled. Felt particularly tired recently with SBP 116 mmHg. Also having a lot of back discomfort, hurting between shoulder blades and in different areas. Difficult to get comfortable at times. History of lumbar back surgery. Does chair exercises once weekly with no concerning cardiac symptoms. No orthopnea, PND, edema. Symptoms were concerning for angina and she was scheduled for lexiscan myoview to r/o worsening ischemia. Atenolol was decreased to 25 mg BID and Imdur 15 mg daily was added.   Nuclear stress test completed 04/02/23 revealed no evidence of ischemia or infarction and normal heart function.   She contacted our office 04/17/23 to report not feeling well since starting Imdur including headaches, nervousness, dizziness, and fluctuations in  blood pressure. She was advised to d/c Imdur and consider resuming atenolol at previous dose.   Today, she is here for follow-up. She unfortunately got very sick after a bus trip to [place] and had cold symptoms for about a month. Is feeling better. Has been doing PT for neck and back and plans to continue these exercises at the gym. She would also like to resume walking for exercise. Previously walked 3 miles several  days per week.  She denies chest pain, shortness of breath, lower extremity edema, fatigue, palpitations, melena, presyncope, syncope, PND, orthopnea.   Past Medical History:  Diagnosis Date   Allergy    Anemia    Angina pectoris    Anxiety    Cataract    Cervical cancer screening 01/31/2016   Menarche at 12 Regular and moderate flow No history of abnormal pap in past G1P1, s/p 1 SVD No history of abnormal MGM No concerns today gyn surgeries b/l tubal ligation and b/y oophorectomy   Colon cancer (HCC) 2010   Coronary artery disease 2001/2005   S/P stenting to RCA and LAD   GERD (gastroesophageal reflux disease)    Hearing loss 08/05/2015   History of chicken pox    HTN (hypertension)    Menopause    Preventative health care 01/31/2016   Pure hypercholesterolemia    Vitamin D deficiency 06/01/2017    Past Surgical History:  Procedure Laterality Date   APPENDECTOMY  02/21/2009   BACK SURGERY  1978, 1988   multiple   BILATERAL OOPHORECTOMY  09/15/2008   CHOLECYSTECTOMY  07/16/2008   COLON SURGERY  09/15/2008   right colectomy   COLONOSCOPY  01/26/2014   NORMAL    CORONARY STENT PLACEMENT  2001/2005   RCA   CYSTECTOMY     left ovary   EYE SURGERY Bilateral 2024   L cataract 2016, R 2024   MELANOMA EXCISION Right 03/05/2022   right lower leg lesion removed    Current Medications: Current Meds  Medication Sig   aspirin EC (ASPIRIN LOW DOSE) 81 MG tablet TAKE 1 TABLET (81 MG TOTAL) BY MOUTH DAILY. SWALLOW WHOLE.   atenolol (TENORMIN) 50 MG tablet Take 1 tablet (50 mg total) by mouth 2 (two) times daily.   atorvastatin (LIPITOR) 40 MG tablet TAKE 1 TABLET BY MOUTH EVERY DAY   Cholecalciferol (VITAMIN D3) 50 MCG (2000 UT) CAPS Take 1 capsule by mouth every 7 (seven) days.   meclizine (ANTIVERT) 25 MG tablet Take 1 tablet (25 mg total) by mouth 3 (three) times daily as needed for dizziness.   Multiple Vitamin (MULTIVITAMIN) tablet Take 1 tablet by mouth daily.    nitroGLYCERIN (NITROSTAT) 0.4 MG SL tablet Place 1 tablet (0.4 mg total) under the tongue every 5 (five) minutes as needed for chest pain.   omeprazole (PRILOSEC) 40 MG capsule TAKE 1 CAPSULE (40 MG TOTAL) BY MOUTH DAILY.   tiZANidine (ZANAFLEX) 2 MG tablet Take 0.5-2 tablets (1-4 mg total) by mouth every 6 (six) hours as needed for muscle spasms.   topiramate (TOPAMAX) 100 MG tablet Take 100 mg by mouth daily.      Allergies:   Patient has no known allergies.   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  Other Topics Concern   Not on file  Social History Narrative   Not on file   Social Determinants of Health   Financial Resource Strain: Low Risk  (08/02/2021)   Overall Financial Resource Strain (CARDIA)    Difficulty of Paying Living Expenses: Not hard at all  Food Insecurity: No Food Insecurity (08/04/2022)   Hunger Vital Sign    Worried About Running Out of Food in the Last Year: Never true    Ran Out of Food in the Last Year: Never true  Transportation Needs: No Transportation Needs (08/04/2022)   PRAPARE - Administrator, Civil Service (Medical): No    Lack of Transportation (Non-Medical): No  Physical Activity: Inactive (08/02/2021)   Exercise Vital Sign    Days of Exercise per Week: 0 days    Minutes of Exercise per Session: 0 min  Stress: No Stress Concern Present (08/02/2021)   [employer] of Occupational Health - Occupational Stress  Questionnaire    Feeling of Stress : Only a little  Social Connections: Socially Integrated (08/02/2021)   Social Connection and Isolation Panel [NHANES]    Frequency of Communication with Friends and Family: More than three times a week    Frequency of Social Gatherings with Friends and Family: More than three times a week    Attends Religious Services: More than 4 times per year    Active Member of [employer] or Organizations: Yes    Attends Engineer, structural: More than 4 times per year    Marital Status: Married     Family History: The patient's family history includes Alcohol abuse in her father; Arthritis in her father; Colon polyps in her mother; Coronary artery disease in an other family member; Heart attack in her father and mother; Heart disease in her father and mother; Hypertension in her mother; Leukemia in her mother; Prostate cancer in her father; Thyroid disease in her daughter; Transient ischemic attack in her mother. There is no history of Colon cancer, Pancreatic cancer, Rectal cancer, Stomach cancer, or Esophageal cancer.  ROS:   Please see the history of present illness.    + chronic neck/back/shoulder pain  All other systems reviewed and are negative.  Labs/Other Studies Reviewed:    The following studies were reviewed today:  ETT 07/10/20  Fair exercise capacity, achieved 6.3 METS Hypertensive response to exercise, peak BP 207/78 No stress EKG changes to suggest ischemia  Cardiac Cath 05/2009  Widely patent stents  Recent Labs: 03/30/2023: ALT 24; BUN 16; Creatinine, Ser 0.84; Hemoglobin 12.9; Magnesium 1.9; Platelets 287.0; Potassium 4.2; Sodium 136; TSH 2.26  Recent Lipid Panel    Component Value Date/Time   CHOL 130 03/30/2023 1420   CHOL 134 12/10/2022 0838   TRIG 111.0 03/30/2023 1420   HDL 58.20 03/30/2023 1420   HDL 60 12/10/2022 0838   CHOLHDL 2 03/30/2023 1420   VLDL 22.2 03/30/2023 1420   LDLCALC 50 03/30/2023 1420   LDLCALC 52  12/10/2022 0838     Risk Assessment/Calculations:      Physical Exam:    VS:  BP (!) 160/80   Pulse 69  Ht 5\' 2"  (1.575 m)   Wt 132 lb (59.9 kg)   SpO2 97%   BMI 24.14 kg/m     Wt Readings from Last 3 Encounters:  06/05/23 132 lb (59.9 kg)  04/02/23 134 lb (60.8 kg)  03/31/23 134 lb (60.8 kg)     GEN:  Well nourished, well developed in no acute distress HEENT: Normal NECK: No JVD; No carotid bruits CARDIAC: RRR, no murmurs, rubs, gallops RESPIRATORY:  Clear to auscultation without rales, wheezing or rhonchi  ABDOMEN: Soft, non-tender, non-distended MUSCULOSKELETAL:  No edema; No deformity. 2+ pedal pulses, equal bilaterally SKIN: Warm and dry NEUROLOGIC:  Alert and oriented x 3 PSYCHIATRIC:  Normal affect   EKG:  Is not ordered today      HYPERTENSION CONTROL Vitals:   06/05/23 0840 06/05/23 0951  BP: (!) 166/80 (!) 160/80    The patient's blood pressure is elevated above target today.  In order to address the patient's elevated BP:       Diagnoses:    1. Coronary artery disease involving native coronary artery of native heart without angina pectoris   2. White coat syndrome with diagnosis of hypertension   3. Hyperlipidemia LDL goal <70      Assessment and Plan:     CAD without angina: Prior stenting in LAD 2005, RCA 2006 with patent stents identified on cath 2010 and 20-30% stenosis edge of LAD stent and 30% discrete stenosis RCA at that time. We tried Imdur for chest and back pain that was concerning for angina. She did not tolerate it. Lexiscan myoview 04/02/23 with no evidence of ischemia, normal LV function. She is feeling better since completed PT for neck and back pain. Encouraged her to gradually resume exercise to achieve 150 minutes moderate intensity exercise each week. Advised her to notify us if she develops concerning symptoms with exertion. No bleeding concerns. Continue aspirin, atenolol, atorvastatin.   Hyperlipidemia LDL goal < 70: LDL  50 on 1/61/09. At goal. Continue atorvastatin.  Encouraged a whole food heart healthy diet low in saturated fat, simple carbohydrates, and processed foods along with regular physical activity.    Hypertension/White coat hypertension: BP is elevated today and is always elevated when she comes into our office. History of white coat hypertension.  Home BP very well controlled. She is on atenolol 50 mg twice daily. No changes today.      Disposition: 6 months with Dr. Eden Emms or me  Medication Adjustments/Labs and Tests Ordered: Current medicines are reviewed at length with the patient today.  Concerns regarding medicines are outlined above.  No orders of the defined types were placed in this encounter.  No orders of the defined types were placed in this encounter.   Patient Instructions  Medication Instructions:  Your physician recommends that you continue on your current medications as directed. Please refer to the Current Medication list given to you today.  *If you need a refill on your cardiac medications before your next appointment, please call your pharmacy*   Lab Work: None ordered  If you have labs (blood work) drawn today and your tests are completely normal, you will receive your results only by: MyChart Message (if you have MyChart) OR A paper copy in the mail If you have any lab test that is abnormal or we need to change your treatment, we will call you to review the results.   Testing/Procedures: None ordered   Follow-Up: At [facility], you and your health needs are our  priority.  As part of our continuing mission to provide you with exceptional heart care, we have created designated Provider Care Teams.  These Care Teams include your primary Cardiologist (physician) and Advanced Practice Providers (APPs -  Physician Assistants and Nurse Practitioners) who all work together to provide you with the care you need, when you need it.  We recommend signing up for  the patient portal called "MyChart".  Sign up information is provided on this After Visit Summary.  MyChart is used to connect with patients for Virtual Visits (Telemedicine).  Patients are able to view lab/test results, encounter notes, upcoming appointments, etc.  Non-urgent messages can be sent to your provider as well.   To learn more about what you can do with MyChart, go to [email or web address].    Your next appointment:   As planned  Provider:   Charlton Haws, MD     Other Instructions     Signed, Levi Aland, NP  06/05/2023 10:51 AM    [facility]

## 2023-06-05 ENCOUNTER — Ambulatory Visit
Admission: RE | Admit: 2023-06-05 | Discharge: 2023-06-05 | Disposition: A | Discharge status: Home / Self Care | Payer: Medicare Other | Source: Ambulatory Visit | Attending: Family Medicine | Admitting: Family Medicine

## 2023-06-05 ENCOUNTER — Encounter: Payer: Self-pay | Admitting: Nurse Practitioner

## 2023-06-05 ENCOUNTER — Ambulatory Visit: Payer: Medicare Other | Attending: Nurse Practitioner | Admitting: Nurse Practitioner

## 2023-06-05 VITALS — BP 160/80 | HR 69 | Ht 62.0 in | Wt 132.0 lb

## 2023-06-05 DIAGNOSIS — E785 Hyperlipidemia, unspecified: Secondary | ICD-10-CM | POA: Diagnosis not present

## 2023-06-05 DIAGNOSIS — Z1231 Encounter for screening mammogram for malignant neoplasm of breast: Secondary | ICD-10-CM | POA: Diagnosis not present

## 2023-06-05 DIAGNOSIS — I10 Essential (primary) hypertension: Secondary | ICD-10-CM | POA: Diagnosis not present

## 2023-06-05 DIAGNOSIS — I2510 Atherosclerotic heart disease of native coronary artery without angina pectoris: Secondary | ICD-10-CM | POA: Diagnosis not present

## 2023-06-05 NOTE — Patient Instructions (Signed)
Medication Instructions:  Your physician recommends that you continue on your current medications as directed. Please refer to the Current Medication list given to you today.  *If you need a refill on your cardiac medications before your next appointment, please call your pharmacy*   Lab Work: None ordered  If you have labs (blood work) drawn today and your tests are completely normal, you will receive your results only by: MyChart Message (if you have MyChart) OR A paper copy in the mail If you have any lab test that is abnormal or we need to change your treatment, we will call you to review the results.   Testing/Procedures: None ordered   Follow-Up: At [facility], you and your health needs are our priority.  As part of our continuing mission to provide you with exceptional heart care, we have created designated Provider Care Teams.  These Care Teams include your primary Cardiologist (physician) and Advanced Practice Providers (APPs -  Physician Assistants and Nurse Practitioners) who all work together to provide you with the care you need, when you need it.  We recommend signing up for the patient portal called "MyChart".  Sign up information is provided on this After Visit Summary.  MyChart is used to connect with patients for Virtual Visits (Telemedicine).  Patients are able to view lab/test results, encounter notes, upcoming appointments, etc.  Non-urgent messages can be sent to your provider as well.   To learn more about what you can do with MyChart, go to [email or web address].    Your next appointment:   As planned  Provider:   Charlton Haws, MD     Other Instructions

## 2023-06-21 DIAGNOSIS — M542 Cervicalgia: Secondary | ICD-10-CM | POA: Insufficient documentation

## 2023-06-21 NOTE — Assessment & Plan Note (Signed)
Encourage heart healthy diet such as MIND or DASH diet, increase exercise, avoid trans fats, simple carbohydrates and processed foods, consider a krill or fish or flaxseed oil cap daily. Tolerating Atorvastatin 

## 2023-06-21 NOTE — Assessment & Plan Note (Signed)
Well controlled, no changes to meds. Encouraged heart healthy diet such as the DASH diet and exercise as tolerated.  °

## 2023-06-21 NOTE — Assessment & Plan Note (Signed)
Encouraged moist heat and gentle stretching as tolerated. May try NSAIDs and prescription meds as directed and report if symptoms worsen or seek immediate care has been in physical therapy

## 2023-06-21 NOTE — Assessment & Plan Note (Signed)
Has been flared and has recently had an endoscopy with gastroenterology and has been improving on increased dosing of Omeprazole. She can use Famotidine qhs prn as well and avoid offending foods.

## 2023-06-21 NOTE — Assessment & Plan Note (Signed)
Supplement and monitor 

## 2023-06-23 ENCOUNTER — Ambulatory Visit (INDEPENDENT_AMBULATORY_CARE_PROVIDER_SITE_OTHER): Payer: Medicare Other | Admitting: Family Medicine

## 2023-06-23 VITALS — BP 138/72 | HR 66 | Temp 97.9°F | Resp 16 | Ht 62.0 in | Wt 133.2 lb

## 2023-06-23 DIAGNOSIS — R739 Hyperglycemia, unspecified: Secondary | ICD-10-CM | POA: Diagnosis not present

## 2023-06-23 DIAGNOSIS — E7800 Pure hypercholesterolemia, unspecified: Secondary | ICD-10-CM

## 2023-06-23 DIAGNOSIS — E559 Vitamin D deficiency, unspecified: Secondary | ICD-10-CM | POA: Diagnosis not present

## 2023-06-23 DIAGNOSIS — M542 Cervicalgia: Secondary | ICD-10-CM | POA: Diagnosis not present

## 2023-06-23 DIAGNOSIS — I10 Essential (primary) hypertension: Secondary | ICD-10-CM | POA: Diagnosis not present

## 2023-06-23 DIAGNOSIS — M549 Dorsalgia, unspecified: Secondary | ICD-10-CM | POA: Diagnosis not present

## 2023-06-23 DIAGNOSIS — K219 Gastro-esophageal reflux disease without esophagitis: Secondary | ICD-10-CM | POA: Diagnosis not present

## 2023-06-23 LAB — CBC WITH DIFFERENTIAL/PLATELET
Basophils Absolute: 0 10*3/uL (ref 0.0–0.1)
Basophils Relative: 0.6 % (ref 0.0–3.0)
Eosinophils Absolute: 0.1 10*3/uL (ref 0.0–0.7)
Eosinophils Relative: 1.8 % (ref 0.0–5.0)
HCT: 39.8 % (ref 36.0–46.0)
Hemoglobin: 12.9 g/dL (ref 12.0–15.0)
Lymphocytes Relative: 23.8 % (ref 12.0–46.0)
Lymphs Abs: 1.6 10*3/uL (ref 0.7–4.0)
MCHC: 32.4 g/dL (ref 30.0–36.0)
MCV: 86.2 fL (ref 78.0–100.0)
Monocytes Absolute: 0.6 10*3/uL (ref 0.1–1.0)
Monocytes Relative: 8.7 % (ref 3.0–12.0)
Neutro Abs: 4.2 10*3/uL (ref 1.4–7.7)
Neutrophils Relative %: 65.1 % (ref 43.0–77.0)
Platelets: 306 10*3/uL (ref 150.0–400.0)
RBC: 4.61 Mil/uL (ref 3.87–5.11)
RDW: 14.1 % (ref 11.5–15.5)
WBC: 6.5 10*3/uL (ref 4.0–10.5)

## 2023-06-23 LAB — VITAMIN D 25 HYDROXY (VIT D DEFICIENCY, FRACTURES): VITD: 29.59 ng/mL — ABNORMAL LOW (ref 30.00–100.00)

## 2023-06-23 LAB — TSH: TSH: 2.81 u[IU]/mL (ref 0.35–5.50)

## 2023-06-23 LAB — HEMOGLOBIN A1C: Hgb A1c MFr Bld: 5.9 % (ref 4.6–6.5)

## 2023-06-23 MED ORDER — VITAMIN D3 50 MCG (2000 UT) PO CAPS: 2000.0000 [IU] | ORAL_CAPSULE | Freq: Every day | ORAL | Status: AC

## 2023-06-23 NOTE — Patient Instructions (Addendum)
RSV, Respiratory Syncitial Virus vaccine, Arexvy at pharmacy  COVID booster consider   Hypertension, Adult High blood pressure (hypertension) is when the force of blood pumping through the arteries is too strong. The arteries are the blood vessels that carry blood from the heart throughout the body. Hypertension forces the heart to work harder to pump blood and may cause arteries to become narrow or stiff. Untreated or uncontrolled hypertension can lead to a heart attack, heart failure, a stroke, kidney disease, and other problems. A blood pressure reading consists of a higher number over a lower number. Ideally, your blood pressure should be below 120/80. The first ("top") number is called the systolic pressure. It is a measure of the pressure in your arteries as your heart beats. The second ("bottom") number is called the diastolic pressure. It is a measure of the pressure in your arteries as the heart relaxes. What are the causes? The exact cause of this condition is not known. There are some conditions that result in high blood pressure. What increases the risk? Certain factors may make you more likely to develop high blood pressure. Some of these risk factors are under your control, including: Smoking. Not getting enough exercise or physical activity. Being overweight. Having too much fat, sugar, calories, or salt (sodium) in your diet. Drinking too much alcohol. Other risk factors include: Having a personal history of heart disease, diabetes, high cholesterol, or kidney disease. Stress. Having a family history of high blood pressure and high cholesterol. Having obstructive sleep apnea. Age. The risk increases with age. What are the signs or symptoms? High blood pressure may not cause symptoms. Very high blood pressure (hypertensive crisis) may cause: Headache. Fast or irregular heartbeats (palpitations). Shortness of breath. Nosebleed. Nausea and vomiting. Vision changes. Severe  chest pain, dizziness, and seizures. How is this diagnosed? This condition is diagnosed by measuring your blood pressure while you are seated, with your arm resting on a flat surface, your legs uncrossed, and your feet flat on the floor. The cuff of the blood pressure monitor will be placed directly against the skin of your upper arm at the level of your heart. Blood pressure should be measured at least twice using the same arm. Certain conditions can cause a difference in blood pressure between your right and left arms. If you have a high blood pressure reading during one visit or you have normal blood pressure with other risk factors, you may be asked to: Return on a different day to have your blood pressure checked again. Monitor your blood pressure at home for 1 week or longer. If you are diagnosed with hypertension, you may have other blood or imaging tests to help your health care provider understand your overall risk for other conditions. How is this treated? This condition is treated by making healthy lifestyle changes, such as eating healthy foods, exercising more, and reducing your alcohol intake. You may be referred for counseling on a healthy diet and physical activity. Your health care provider may prescribe medicine if lifestyle changes are not enough to get your blood pressure under control and if: Your systolic blood pressure is above 130. Your diastolic blood pressure is above 80. Your personal target blood pressure may vary depending on your medical conditions, your age, and other factors. Follow these instructions at home: Eating and drinking  Eat a diet that is high in fiber and potassium, and low in sodium, added sugar, and fat. An example of this eating plan is called the DASH  diet. DASH stands for Dietary Approaches to Stop Hypertension. To eat this way: Eat plenty of fresh fruits and vegetables. Try to fill one half of your plate at each meal with fruits and vegetables. Eat  whole grains, such as whole-wheat pasta, brown rice, or whole-grain bread. Fill about one fourth of your plate with whole grains. Eat or drink low-fat dairy products, such as skim milk or low-fat yogurt. Avoid fatty cuts of meat, processed or cured meats, and poultry with skin. Fill about one fourth of your plate with lean proteins, such as fish, chicken without skin, beans, eggs, or tofu. Avoid pre-made and processed foods. These tend to be higher in sodium, added sugar, and fat. Reduce your daily sodium intake. Many people with hypertension should eat less than 1,500 mg of sodium a day. Do not drink alcohol if: Your health care provider tells you not to drink. You are pregnant, may be pregnant, or are planning to become pregnant. If you drink alcohol: Limit how much you have to: 0-1 drink a day for women. 0-2 drinks a day for men. Know how much alcohol is in your drink. In the [place] one drink equals one 12 oz bottle of beer (355 mL), one 5 oz glass of wine (148 mL), or one 1 oz glass of hard liquor (44 mL). Lifestyle  Work with your health care provider to maintain a healthy body weight or to lose weight. Ask what an ideal weight is for you. Get at least 30 minutes of exercise that causes your heart to beat faster (aerobic exercise) most days of the week. Activities may include walking, swimming, or biking. Include exercise to strengthen your muscles (resistance exercise), such as Pilates or lifting weights, as part of your weekly exercise routine. Try to do these types of exercises for 30 minutes at least 3 days a week. Do not use any products that contain nicotine or tobacco. These products include cigarettes, chewing tobacco, and vaping devices, such as e-cigarettes. If you need help quitting, ask your health care provider. Monitor your blood pressure at home as told by your health care provider. Keep all follow-up visits. This is important. Medicines Take over-the-counter and  prescription medicines only as told by your health care provider. Follow directions carefully. Blood pressure medicines must be taken as prescribed. Do not skip doses of blood pressure medicine. Doing this puts you at risk for problems and can make the medicine less effective. Ask your health care provider about side effects or reactions to medicines that you should watch for. Contact a health care provider if you: Think you are having a reaction to a medicine you are taking. Have headaches that keep coming back (recurring). Feel dizzy. Have swelling in your ankles. Have trouble with your vision. Get help right away if you: Develop a severe headache or confusion. Have unusual weakness or numbness. Feel faint. Have severe pain in your chest or abdomen. Vomit repeatedly. Have trouble breathing. These symptoms may be an emergency. Get help right away. Call 911. Do not wait to see if the symptoms will go away. Do not drive yourself to the hospital. Summary Hypertension is when the force of blood pumping through your arteries is too strong. If this condition is not controlled, it may put you at risk for serious complications. Your personal target blood pressure may vary depending on your medical conditions, your age, and other factors. For most people, a normal blood pressure is less than 120/80. Hypertension is treated with lifestyle changes,  medicines, or a combination of both. Lifestyle changes include losing weight, eating a healthy, low-sodium diet, exercising more, and limiting alcohol. This information is not intended to replace advice given to you by your health care provider. Make sure you discuss any questions you have with your health care provider. Document Revised: 07/09/2021 Document Reviewed: 07/09/2021 Elsevier Patient Education  2024 [employer].

## 2023-06-23 NOTE — Assessment & Plan Note (Signed)
hgba1c acceptable, minimize simple carbs. Increase exercise as tolerated.  

## 2023-06-23 NOTE — Assessment & Plan Note (Addendum)
Struggles especially at night wakes her up will try Tizanidine 1-4 mg po tid prn especially at bedtime. Moist head and gentle stretching daily

## 2023-06-24 ENCOUNTER — Encounter: Payer: Self-pay | Admitting: Family Medicine

## 2023-06-24 LAB — COMPREHENSIVE METABOLIC PANEL
ALT: 19 U/L (ref 0–35)
AST: 23 U/L (ref 0–37)
Albumin: 4.2 g/dL (ref 3.5–5.2)
Alkaline Phosphatase: 117 U/L (ref 39–117)
BUN: 17 mg/dL (ref 6–23)
CO2: 26 meq/L (ref 19–32)
Calcium: 9.7 mg/dL (ref 8.4–10.5)
Chloride: 105 meq/L (ref 96–112)
Creatinine, Ser: 0.71 mg/dL (ref 0.40–1.20)
GFR: 85.3 mL/min (ref >60.00)
Glucose, Bld: 92 mg/dL (ref 70–99)
Potassium: 4.8 meq/L (ref 3.5–5.1)
Sodium: 137 meq/L (ref 135–145)
Total Bilirubin: 0.6 mg/dL (ref 0.2–1.2)
Total Protein: 6.5 g/dL (ref 6.0–8.3)

## 2023-06-24 LAB — LIPID PANEL
Cholesterol: 145 mg/dL (ref 0–200)
HDL: 66.4 mg/dL (ref >39.00)
LDL Cholesterol: 51 mg/dL (ref 0–99)
NonHDL: 78.57
Total CHOL/HDL Ratio: 2
Triglycerides: 136 mg/dL (ref 0.0–149.0)
VLDL: 27.2 mg/dL (ref 0.0–40.0)

## 2023-06-24 NOTE — Progress Notes (Signed)
Subjective:    Patient ID: Brenda Lara, female    DOB: Jun 11, 1951, 72 y.o.   MRN: [id number]  Chief Complaint  Patient presents with  . Follow-up    Follow up    HPI Discussed the use of AI scribe software for clinical note transcription with the patient, who gave verbal consent to proceed.  History of Present Illness   The patient, with a history of neck and lower back pain, has been managing her condition with physical therapy. She reports that the therapy has been helpful, but she has accepted that the pain is something she will have to live with. The patient also experiences discomfort in her lower back when lying down, which often disrupts her sleep. She has been taking topiramate for migraines and reports no new symptoms or falls.  The patient's recent blood work shows an A1c level of 6, indicating prediabetes. She reports a diet that is generally healthy and consumes mostly water for hydration. She is also taking vitamin D supplements daily. The patient has received the flu shot but has declined the COVID-19 vaccine.        Past Medical History:  Diagnosis Date  . Allergy   . Anemia   . Angina pectoris   . Anxiety   . Cataract   . Cervical cancer screening 01/31/2016   Menarche at 12 Regular and moderate flow No history of abnormal pap in past G1P1, s/p 1 SVD No history of abnormal MGM No concerns today gyn surgeries b/l tubal ligation and b/y oophorectomy  . Colon cancer (HCC) 2010  . Coronary artery disease 2001/2005   S/P stenting to RCA and LAD  . GERD (gastroesophageal reflux disease)   . Hearing loss 08/05/2015  . History of chicken pox   . HTN (hypertension)   . Menopause   . Preventative health care 01/31/2016  . Pure hypercholesterolemia   . Vitamin D deficiency 06/01/2017    Past Surgical History:  Procedure Laterality Date  . APPENDECTOMY  02/21/2009  . BACK SURGERY  1978, 1988   multiple  . BILATERAL OOPHORECTOMY  09/15/2008  . CHOLECYSTECTOMY   07/16/2008  . COLON SURGERY  09/15/2008   right colectomy  . COLONOSCOPY  01/26/2014   NORMAL   . CORONARY STENT PLACEMENT  2001/2005   RCA  . CYSTECTOMY     left ovary  . EYE SURGERY Bilateral 2024   L cataract 2016, R 2024  . MELANOMA EXCISION Right 03/05/2022   right lower leg lesion removed    Family History  Problem Relation Age of Onset  . Colon polyps Mother   . Hypertension Mother   . Transient ischemic attack Mother   . Heart attack Mother        age 32  . Heart disease Mother   . Leukemia Mother   . Heart attack Father        36's  . Heart disease Father   . Alcohol abuse Father   . Arthritis Father        mva  . Prostate cancer Father   . Thyroid disease Daughter   . Coronary artery disease Other   . Colon cancer Neg Hx   . Pancreatic cancer Neg Hx   . Rectal cancer Neg Hx   . Stomach cancer Neg Hx   . Esophageal cancer Neg Hx     Social History   Socioeconomic History  . Marital status: Married    Spouse name: Not on file  .  Number of children: Not on file  . Years of education: Not on file  . Highest education level: Associate degree: occupational, Scientist, product/process development, or vocational program  Occupational History  . Not on file  Tobacco Use  . Smoking status: Never  . Smokeless tobacco: Never  Vaping Use  . Vaping status: Never Used  Substance and Sexual Activity  . Alcohol use: No  . Drug use: No  . 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 Not on file  Social History Narrative  . Not on file   Social Determinants of Health   Financial Resource Strain: Low Risk  (06/16/2023)   Overall Financial Resource Strain (CARDIA)   . Difficulty of Paying Living Expenses: Not hard at all  Food Insecurity: No Food Insecurity (06/16/2023)   Hunger Vital Sign   . Worried About Programme researcher, broadcasting/film/video in the Last Year: Never true   . Ran Out  of Food in the Last Year: Never true  Transportation Needs: No Transportation Needs (06/16/2023)   PRAPARE - Transportation   . Lack of Transportation (Medical): No   . Lack of Transportation (Non-Medical): No  Physical Activity: Insufficiently Active (06/16/2023)   Exercise Vital Sign   . Days of Exercise per Week: 3 days   . Minutes of Exercise per Session: 30 min  Stress: No Stress Concern Present (06/16/2023)   [employer] of Occupational Health - Occupational Stress Questionnaire   . Feeling of Stress : Not at all  Social Connections: Socially Integrated (06/16/2023)   Social Connection and Isolation Panel [NHANES]   . Frequency of Communication with Friends and Family: More than three times a week   . Frequency of Social Gatherings with Friends and Family: Three times a week   . Attends Religious Services: More than 4 times per year   . Active Member of Clubs or Organizations: Yes   . Attends Banker Meetings: More than 4 times per year   . Marital Status: Married  Catering manager Violence: Not At Risk (08/04/2022)   Humiliation, Afraid, Rape, and Kick questionnaire   . Fear of Current or Ex-Partner: No   . Emotionally Abused: No   . Physically Abused: No   . Sexually Abused: No    Outpatient Medications Prior to Visit  Medication Sig Dispense Refill  . aspirin EC (ASPIRIN LOW DOSE) 81 MG tablet TAKE 1 TABLET (81 MG TOTAL) BY MOUTH DAILY. SWALLOW WHOLE. 90 tablet 3  . atenolol (TENORMIN) 50 MG tablet Take 1 tablet (50 mg total) by mouth 2 (two) times daily. 180 tablet 0  . atorvastatin (LIPITOR) 40 MG tablet TAKE 1 TABLET BY MOUTH EVERY DAY 90 tablet 1  . meclizine (ANTIVERT) 25 MG tablet Take 1 tablet (25 mg total) by mouth 3 (three) times daily as needed for dizziness. 90 tablet 0  . Multiple Vitamin (MULTIVITAMIN) tablet Take 1 tablet by mouth daily.    . nitroGLYCERIN (NITROSTAT) 0.4 MG SL tablet Place 1 tablet (0.4 mg total) under the tongue every 5  (five) minutes as needed for chest pain. 25 tablet 3  . omeprazole (PRILOSEC) 40 MG capsule TAKE 1 CAPSULE (40 MG TOTAL) BY MOUTH DAILY. 90 capsule 1  . tiZANidine (ZANAFLEX) 2 MG tablet Take 0.5-2 tablets (1-4 mg total) by mouth every 6 (six) hours as needed for muscle spasms. 30 tablet 1  . topiramate (  TOPAMAX) 100 MG tablet Take 100 mg by mouth daily.     . Cholecalciferol (VITAMIN D3) 50 MCG (2000 UT) CAPS Take 1 capsule by mouth every 7 (seven) days.     No facility-administered medications prior to visit.    No Known Allergies  Review of Systems  Constitutional:  Negative for fever and malaise/fatigue.  HENT:  Negative for congestion.   Eyes:  Negative for blurred vision.  Respiratory:  Negative for shortness of breath.   Cardiovascular:  Negative for chest pain, palpitations and leg swelling.  Gastrointestinal:  Negative for abdominal pain, blood in stool and nausea.  Genitourinary:  Negative for dysuria and frequency.  Musculoskeletal:  Positive for back pain and neck pain. Negative for falls.  Skin:  Negative for rash.  Neurological:  Negative for dizziness, loss of consciousness and headaches.  Endo/Heme/Allergies:  Negative for environmental allergies.  Psychiatric/Behavioral:  Negative for depression. The patient has insomnia. The patient is not nervous/anxious.       Objective:    Physical Exam Constitutional:      General: She is not in acute distress.    Appearance: Normal appearance. She is well-developed. She is not toxic-appearing.  HENT:     Head: Normocephalic and atraumatic.     Right Ear: External ear normal.     Left Ear: External ear normal.     Nose: Nose normal.  Eyes:     General:        Right eye: No discharge.        Left eye: No discharge.     Conjunctiva/sclera: Conjunctivae normal.  Neck:     Thyroid: No thyromegaly.  Cardiovascular:     Rate and Rhythm: Normal rate and regular rhythm.     Heart sounds: Normal heart sounds. No murmur  heard. Pulmonary:     Effort: Pulmonary effort is normal. No respiratory distress.     Breath sounds: Normal breath sounds.  Abdominal:     General: Bowel sounds are normal.     Palpations: Abdomen is soft.     Tenderness: There is no abdominal tenderness. There is no guarding.  Musculoskeletal:        General: Normal range of motion.     Cervical back: Neck supple.  Lymphadenopathy:     Cervical: No cervical adenopathy.  Skin:    General: Skin is warm and dry.  Neurological:     Mental Status: She is alert and oriented to person, place, and time.  Psychiatric:        Mood and Affect: Mood normal.        Behavior: Behavior normal.        Thought Content: Thought content normal.        Judgment: Judgment normal.   BP 138/72 (BP Location: Left Arm, Patient Position: Sitting, Cuff Size: Normal)   Pulse 66   Temp 97.9 F (36.6 C) (Oral)   Resp 16   Ht 5\' 2"  (1.575 m)   Wt 133 lb 3.2 oz (60.4 kg)   SpO2 97%   BMI 24.36 kg/m  Wt Readings from Last 3 Encounters:  06/23/23 133 lb 3.2 oz (60.4 kg)  06/05/23 132 lb (59.9 kg)  04/02/23 134 lb (60.8 kg)    Diabetic Foot Exam - Simple   No data filed    Lab Results  Component Value Date   WBC 6.5 06/23/2023   HGB 12.9 06/23/2023   HCT 39.8 06/23/2023   PLT 306.0 06/23/2023   GLUCOSE  92 06/23/2023   CHOL 145 06/23/2023   TRIG 136.0 06/23/2023   HDL 66.40 06/23/2023   LDLCALC 51 06/23/2023   ALT 19 06/23/2023   AST 23 06/23/2023   NA 137 06/23/2023   K 4.8 06/23/2023   CL 105 06/23/2023   CREATININE 0.71 06/23/2023   BUN 17 06/23/2023   CO2 26 06/23/2023   TSH 2.81 06/23/2023   INR 0.95 05/13/2011   HGBA1C 5.9 06/23/2023    Lab Results  Component Value Date   TSH 2.81 06/23/2023   Lab Results  Component Value Date   WBC 6.5 06/23/2023   HGB 12.9 06/23/2023   HCT 39.8 06/23/2023   MCV 86.2 06/23/2023   PLT 306.0 06/23/2023   Lab Results  Component Value Date   NA 137 06/23/2023   K 4.8 06/23/2023    CO2 26 06/23/2023   GLUCOSE 92 06/23/2023   BUN 17 06/23/2023   CREATININE 0.71 06/23/2023   BILITOT 0.6 06/23/2023   ALKPHOS 117 06/23/2023   AST 23 06/23/2023   ALT 19 06/23/2023   PROT 6.5 06/23/2023   ALBUMIN 4.2 06/23/2023   CALCIUM 9.7 06/23/2023   EGFR 73 12/10/2022   GFR 85.30 06/23/2023   Lab Results  Component Value Date   CHOL 145 06/23/2023   Lab Results  Component Value Date   HDL 66.40 06/23/2023   Lab Results  Component Value Date   LDLCALC 51 06/23/2023   Lab Results  Component Value Date   TRIG 136.0 06/23/2023   Lab Results  Component Value Date   CHOLHDL 2 06/23/2023   Lab Results  Component Value Date   HGBA1C 5.9 06/23/2023       Assessment & Plan:  Essential hypertension Assessment & Plan: Well controlled, no changes to meds. Encouraged heart healthy diet such as the DASH diet and exercise as tolerated.    Orders: -     CBC with Differential/Platelet -     Comprehensive metabolic panel -     TSH  Gastroesophageal reflux disease, unspecified whether esophagitis present Assessment & Plan: Has been flared and has recently had an endoscopy with gastroenterology and has been improving on increased dosing of Omeprazole. She can use Famotidine qhs prn as well and avoid offending foods.    HYPERCHOLESTEROLEMIA Assessment & Plan: Encourage heart healthy diet such as MIND or DASH diet, increase exercise, avoid trans fats, simple carbohydrates and processed foods, consider a krill or fish or flaxseed oil cap daily. Tolerating Atorvastatin  Orders: -     Lipid panel  Vitamin D deficiency Assessment & Plan: Supplement and monitor   Orders: -     VITAMIN D 25 Hydroxy (Vit-D Deficiency, Fractures)  Neck pain Assessment & Plan: Encouraged moist heat and gentle stretching as tolerated. May try NSAIDs and prescription meds as directed and report if symptoms worsen or seek immediate care has been in physical  therapy   Hyperglycemia Assessment & Plan: hgba1c acceptable, minimize simple carbs. Increase exercise as tolerated.   Orders: -     Hemoglobin A1c  Back pain, unspecified back location, unspecified back pain laterality, unspecified chronicity Assessment & Plan: Struggles especially at night wakes her up will try Tizanidine 1-4 mg po tid prn especially at bedtime. Moist head and gentle stretching daily   Other orders -     Vitamin D3; Take 1 capsule (2,000 Units total) by mouth daily.    Assessment and Plan    Chronic Neck Pain Daily pain with history of  previous injury. Completed physical therapy with some improvement. Continues to perform recommended exercises at home. -Continue daily stretching and strengthening exercises. -Use Tizanidine at bedtime as needed for pain and stiffness.  Prediabetes A1C of 6, indicating prediabetes. Discussed the importance of diet and exercise in managing blood sugar levels. -Continue low carbohydrate diet, focusing on whole grains and limiting liquid sugars. -Consider nutrition consult for further dietary guidance. -Continue regular exercise.  Vitamin D Supplementation Currently taking 2000 IU daily. -Continue Vitamin D 2000 IU daily.  General Health Maintenance -Continue annual flu shots and consider annual COVID-19 booster shots. -Consider Oryxvy vaccine for RSV. -Order routine blood work to monitor liver and kidney function.  Follow-up as needed.         Danise Edge, MD

## 2023-07-04 ENCOUNTER — Other Ambulatory Visit: Payer: Self-pay | Admitting: Cardiovascular Disease

## 2023-08-03 ENCOUNTER — Other Ambulatory Visit: Payer: Self-pay | Admitting: Family Medicine

## 2023-08-11 DIAGNOSIS — Z85820 Personal history of malignant melanoma of skin: Secondary | ICD-10-CM | POA: Diagnosis not present

## 2023-08-11 DIAGNOSIS — D224 Melanocytic nevi of scalp and neck: Secondary | ICD-10-CM | POA: Diagnosis not present

## 2023-08-11 DIAGNOSIS — D225 Melanocytic nevi of trunk: Secondary | ICD-10-CM | POA: Diagnosis not present

## 2023-08-11 DIAGNOSIS — L821 Other seborrheic keratosis: Secondary | ICD-10-CM | POA: Diagnosis not present

## 2023-08-11 DIAGNOSIS — L820 Inflamed seborrheic keratosis: Secondary | ICD-10-CM | POA: Diagnosis not present

## 2023-08-11 DIAGNOSIS — D1801 Hemangioma of skin and subcutaneous tissue: Secondary | ICD-10-CM | POA: Diagnosis not present

## 2023-09-01 DIAGNOSIS — L820 Inflamed seborrheic keratosis: Secondary | ICD-10-CM | POA: Diagnosis not present

## 2023-09-22 ENCOUNTER — Ambulatory Visit: Payer: Medicare Other

## 2023-09-22 VITALS — Ht 61.0 in | Wt 133.0 lb

## 2023-09-22 DIAGNOSIS — Z0000 Encounter for general adult medical examination without abnormal findings: Secondary | ICD-10-CM

## 2023-09-22 NOTE — Patient Instructions (Addendum)
 Ms. Lobb , Thank you for taking time to come for your Medicare Wellness Visit. I appreciate your ongoing commitment to your health goals. Please review the following plan we discussed and let me know if I can assist you in the future.   Referrals/Orders/Follow-Ups/Clinician Recommendations:   This is a list of the screening recommended for you and due dates:  Health Maintenance  Topic Date Due   DTaP/Tdap/Td vaccine (2 - Td or Tdap) 07/01/2021   COVID-19 Vaccine (4 - 2024-25 season) 05/17/2023   Mammogram  06/04/2024   Medicare Annual Wellness Visit  09/21/2024   Colon Cancer Screening  07/01/2026   Pneumonia Vaccine  Completed   Flu Shot  Completed   DEXA scan (bone density measurement)  Completed   Hepatitis C Screening  Completed   Zoster (Shingles) Vaccine  Completed   HPV Vaccine  Aged Out    Advanced directives: (Copy Requested) Please bring a copy of your health care power of attorney and living will to the office to be added to your chart at your convenience.  Next Medicare Annual Wellness Visit scheduled for next year: Yes

## 2023-09-22 NOTE — Progress Notes (Signed)
 Subjective:   Brenda Lara is a 73 y.o. female who presents for Medicare Annual (Subsequent) preventive examination.  Visit Complete: Virtual I connected with  Erminio DELENA Metro on 09/22/23 by a audio enabled telemedicine application and verified that I am speaking with the correct person using two identifiers.  Patient Location: Home  Provider Location: Home Office  I discussed the limitations of evaluation and management by telemedicine. The patient expressed understanding and agreed to proceed.  Vital Signs: Because this visit was a virtual/telehealth visit, some criteria may be missing or patient reported. Any vitals not documented were not able to be obtained and vitals that have been documented are patient reported.    Cardiac Risk Factors include: advanced age (>7men, >57 women);hypertension     Objective:    Today's Vitals   09/22/23 1515  Weight: 133 lb (60.3 kg)  Height: 5' 1 (1.549 m)   Body mass index is 25.13 kg/m.     09/22/2023    3:21 PM 04/13/2023   11:13 AM 09/13/2022   12:33 PM 08/04/2022    8:22 AM 08/02/2021    9:08 AM 01/19/2020    8:05 AM 01/17/2019    8:17 AM  Advanced Directives  Does Patient Have a Medical Advance Directive? Yes Yes No Yes No No No  Type of Estate Agent of [place];Living will Healthcare Power of [place];Living will  Healthcare Power of [place];Living will     Does patient want to make changes to medical advance directive?  No - Patient declined  No - Patient declined  No - Patient declined   Copy of Healthcare Power of Attorney in Chart? No - copy requested No - copy requested  No - copy requested     Would patient like information on creating a medical advance directive?     No - Patient declined  Yes (MAU/Ambulatory/Procedural Areas - Information given)    Current Medications (verified) Outpatient Encounter Medications as of 09/22/2023  Medication Sig   aspirin  EC (ASPIRIN  LOW DOSE) 81 MG tablet TAKE  1 TABLET (81 MG TOTAL) BY MOUTH DAILY. SWALLOW WHOLE.   atenolol  (TENORMIN ) 50 MG tablet Take 1 tablet (50 mg total) by mouth 2 (two) times daily.   atorvastatin  (LIPITOR) 40 MG tablet TAKE 1 TABLET BY MOUTH EVERY DAY   Cholecalciferol (VITAMIN D3) 50 MCG (2000 UT) capsule Take 1 capsule (2,000 Units total) by mouth daily.   meclizine  (ANTIVERT ) 25 MG tablet Take 1 tablet (25 mg total) by mouth 3 (three) times daily as needed for dizziness.   Multiple Vitamin (MULTIVITAMIN) tablet Take 1 tablet by mouth daily.   nitroGLYCERIN  (NITROSTAT ) 0.4 MG SL tablet Place 1 tablet (0.4 mg total) under the tongue every 5 (five) minutes as needed for chest pain.   omeprazole  (PRILOSEC) 40 MG capsule Take 1 capsule (40 mg total) by mouth daily.   tiZANidine  (ZANAFLEX ) 2 MG tablet Take 0.5-2 tablets (1-4 mg total) by mouth every 6 (six) hours as needed for muscle spasms.   topiramate (TOPAMAX) 100 MG tablet Take 100 mg by mouth daily.    No facility-administered encounter medications on file as of 09/22/2023.    Allergies (verified) Patient has no known allergies.   History: Past Medical History:  Diagnosis Date   Allergy    Anemia    Angina pectoris    Anxiety    Cataract    Cervical cancer screening 01/31/2016   Menarche at 12 Regular and moderate flow No history of  abnormal pap in past G1P1, s/p 1 SVD No history of abnormal MGM No concerns today gyn surgeries b/l tubal ligation and b/y oophorectomy   Colon cancer (HCC) 2010   Coronary artery disease 2001/2005   S/P stenting to RCA and LAD   GERD (gastroesophageal reflux disease)    Hearing loss 08/05/2015   History of chicken pox    HTN (hypertension)    Menopause    Preventative health care 01/31/2016   Pure hypercholesterolemia    Vitamin D  deficiency 06/01/2017   Past Surgical History:  Procedure Laterality Date   APPENDECTOMY  02/21/2009   BACK SURGERY  1978, 1988   multiple   BILATERAL OOPHORECTOMY  09/15/2008   CHOLECYSTECTOMY   07/16/2008   COLON SURGERY  09/15/2008   right colectomy   COLONOSCOPY  01/26/2014   NORMAL    CORONARY STENT PLACEMENT  2001/2005   RCA   CYSTECTOMY     left ovary   EYE SURGERY Bilateral 2024   L cataract 2016, R 2024   MELANOMA EXCISION Right 03/05/2022   right lower leg lesion removed   Family History  Problem Relation Age of Onset   Colon polyps Mother    Hypertension Mother    Transient ischemic attack Mother    Heart attack Mother        age 76   Heart disease Mother    Leukemia Mother    Heart attack Father        58's   Heart disease Father    Alcohol abuse Father    Arthritis Father        mva   Prostate cancer Father    Thyroid  disease Daughter    Coronary artery disease Other    Colon cancer Neg Hx    Pancreatic cancer Neg Hx    Rectal cancer Neg Hx    Stomach cancer Neg Hx    Esophageal cancer Neg Hx    Social History   Socioeconomic History   Marital status: Married    Spouse name: Not on file   Number of children: Not on file   Years of education: Not on file   Highest education level: Associate degree: occupational, scientist, product/process development, or vocational program  Occupational History   Not on file  Tobacco Use   Smoking status: Never   Smokeless tobacco: Never  Vaping Use   Vaping status: Never Used  Substance and Sexual Activity   Alcohol use: No   Drug use: No   Sexual activity: Yes    Birth control/protection: Post-menopausal    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, 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[employer], no dietary restrictions  Other Topics Concern   Not on file  Social History Narrative   Not on file   Social Drivers of Health   Financial Resource Strain: Low Risk  (09/22/2023)   Overall Financial Resource Strain (CARDIA)    Difficulty of Paying Living Expenses: Not hard at all  Food Insecurity: No Food Insecurity (09/22/2023)   Hunger Vital Sign    Worried About Running Out of Food in the Last Year: Never true    Ran Out of Food in the Last Year: Never true   Transportation Needs: No Transportation Needs (09/22/2023)   PRAPARE - Administrator, Civil Service (Medical): No    Lack of Transportation (Non-Medical): No  Physical Activity: Insufficiently Active (09/22/2023)   Exercise Vital Sign    Days of Exercise per Week: 3 days    Minutes of Exercise per  Session: 30 min  Stress: No Stress Concern Present (09/22/2023)   [employer] of Occupational Health - Occupational Stress Questionnaire    Feeling of Stress : Not at all  Social Connections: Socially Integrated (09/22/2023)   Social Connection and Isolation Panel [NHANES]    Frequency of Communication with Friends and Family: More than three times a week    Frequency of Social Gatherings with Friends and Family: More than three times a week    Attends Religious Services: More than 4 times per year    Active Member of [employer] or Organizations: Yes    Attends Engineer, Structural: More than 4 times per year    Marital Status: Married    Tobacco Counseling Counseling given: Not Answered   Clinical Intake:  Pre-visit preparation completed: Yes  Pain : No/denies pain     BMI - recorded: 25.13 Nutritional Status: BMI 25 -29 Overweight Nutritional Risks: None Diabetes: No  How often do you need to have someone help you when you read instructions, pamphlets, or other written materials from your doctor or pharmacy?: 1 - Never  Interpreter Needed?: No  Information entered by :: Rojelio Blush LPN   Activities of Daily Living    09/22/2023    3:20 PM  In your present state of health, do you have any difficulty performing the following activities:  Hearing? 0  Vision? 0  Difficulty concentrating or making decisions? 0  Walking or climbing stairs? 0  Dressing or bathing? 0  Doing errands, shopping? 0  Preparing Food and eating ? N  Using the Toilet? N  In the past six months, have you accidently leaked urine? N  Do you have problems with loss of bowel control?  N  Managing your Medications? N  Managing your Finances? N  Housekeeping or managing your Housekeeping? N    Patient Care Team: Domenica Harlene LABOR, MD as PCP - General (Family Medicine) Delford Maude BROCKS, MD as PCP - Cardiology (Cardiology) Cloretta Arley NOVAK, MD as Consulting Physician (Oncology)  Indicate any recent Medical Services you may have received from other than [facility] providers in the past year (date may be approximate).     Assessment:   This is a routine wellness examination for Khrystina.  Hearing/Vision screen Hearing Screening - Comments:: Denies hearing difficulties   Vision Screening - Comments:: Wears rx glasses - up to date with routine eye exams with  Dr Evetta   Goals Addressed               This Visit's Progress     Increase physical activity (pt-stated)         Depression Screen    09/22/2023    3:19 PM 06/23/2023    9:28 AM 03/30/2023    1:23 PM 09/25/2022    8:52 AM 08/04/2022    8:23 AM 08/02/2021    9:12 AM 04/01/2021    3:07 PM  PHQ 2/9 Scores  PHQ - 2 Score 0 0 0 0 0 0 0  PHQ- 9 Score   0        Fall Risk    09/22/2023    3:20 PM 06/23/2023    9:28 AM 03/30/2023    1:23 PM 09/25/2022    8:52 AM 08/04/2022    8:23 AM  Fall Risk   Falls in the past year? 0 0 0 0 0  Number falls in past yr: 0 0 0 0 0  Injury with Fall? 0 0 0 0  0  Risk for fall due to : No Fall Risks    No Fall Risks  Follow up Falls prevention discussed Falls evaluation completed Falls evaluation completed Falls evaluation completed Falls evaluation completed    MEDICARE RISK AT HOME: Medicare Risk at Home Any stairs in or around the home?: No If so, are there any without handrails?: No Home free of loose throw rugs in walkways, pet beds, electrical cords, etc?: Yes Adequate lighting in your home to reduce risk of falls?: Yes Life alert?: No Use of a cane, walker or w/c?: No Grab bars in the bathroom?: No Shower chair or bench in shower?: No Elevated toilet seat or a  handicapped toilet?: Yes  TIMED UP AND GO:  Was the test performed?  No    Cognitive Function:        09/22/2023    3:21 PM 08/04/2022    8:27 AM  6CIT Screen  What Year? 0 points 0 points  What month? 0 points 0 points  What time? 0 points 0 points  Count back from 20 0 points 0 points  Months in reverse 0 points 0 points  Repeat phrase 0 points 0 points  Total Score 0 points 0 points    Immunizations Immunization History  Administered Date(s) Administered   Fluad Quad(high Dose 65+) 06/17/2019, 06/04/2022   Influenza Split 06/27/2011   Influenza, High Dose Seasonal PF 06/17/2018, 06/27/2020, 06/16/2023   Influenza-Unspecified 06/17/2017, 08/13/2021, 06/16/2023   Moderna Sars-Covid-2 Vaccination 10/29/2019, 11/26/2019   Pfizer Covid-19 Vaccine Bivalent Booster 28yrs & up 06/11/2021   Pneumococcal Conjugate-13 06/01/2017   Pneumococcal Polysaccharide-23 06/17/2019   Tdap 07/02/2011   Zoster Recombinant(Shingrix) 07/21/2022, 10/07/2022    TDAP status: Due, Education has been provided regarding the importance of this vaccine. Advised may receive this vaccine at local pharmacy or Health Dept. Aware to provide a copy of the vaccination record if obtained from local pharmacy or Health Dept. Verbalized acceptance and understanding.  Flu Vaccine status: Up to date  Pneumococcal vaccine status: Up to date  Covid-19 vaccine status: Declined, Education has been provided regarding the importance of this vaccine but patient still declined. Advised may receive this vaccine at local pharmacy or Health Dept.or vaccine clinic. Aware to provide a copy of the vaccination record if obtained from local pharmacy or Health Dept. Verbalized acceptance and understanding.  Qualifies for Shingles Vaccine? Yes   Zostavax completed Yes   Shingrix Completed?: Yes  Screening Tests Health Maintenance  Topic Date Due   DTaP/Tdap/Td (2 - Td or Tdap) 07/01/2021   COVID-19 Vaccine (4 - 2024-25  season) 05/17/2023   MAMMOGRAM  06/04/2024   Medicare Annual Wellness (AWV)  09/21/2024   Colonoscopy  07/01/2026   Pneumonia Vaccine 57+ Years old  Completed   INFLUENZA VACCINE  Completed   DEXA SCAN  Completed   Hepatitis C Screening  Completed   Zoster Vaccines- Shingrix  Completed   HPV VACCINES  Aged Out    Health Maintenance  Health Maintenance Due  Topic Date Due   DTaP/Tdap/Td (2 - Td or Tdap) 07/01/2021   COVID-19 Vaccine (4 - 2024-25 season) 05/17/2023    Colorectal cancer screening: Type of screening: Colonoscopy. Completed 07/11/21. Repeat every 5 years  Mammogram status: Completed 06/05/23. Repeat every year  Bone Density status: Completed 02/02/20. Results reflect: Bone density results: OSTEOPENIA. Repeat every   years.    Additional Screening:  Hepatitis C Screening: does qualify; Completed 03/30/21  Vision Screening: Recommended annual ophthalmology exams for  early detection of glaucoma and other disorders of the eye. Is the patient up to date with their annual eye exam?  Yes  Who is the provider or what is the name of the office in which the patient attends annual eye exams? Dr Evetta If pt is not established with a provider, would they like to be referred to a provider to establish care? No .   Dental Screening: Recommended annual dental exams for proper oral hygiene    Community Resource Referral / Chronic Care Management:  CRR required this visit?  No   CCM required this visit?  No     Plan:     I have personally reviewed and noted the following in the patient's chart:   Medical and social history Use of alcohol, tobacco or illicit drugs  Current medications and supplements including opioid prescriptions. Patient is not currently taking opioid prescriptions. Functional ability and status Nutritional status Physical activity Advanced directives List of other physicians Hospitalizations, surgeries, and ER visits in previous 12  months Vitals Screenings to include cognitive, depression, and falls Referrals and appointments  In addition, I have reviewed and discussed with patient certain preventive protocols, quality metrics, and best practice recommendations. A written personalized care plan for preventive services as well as general preventive health recommendations were provided to patient.     Rojelio LELON Blush, LPN   04/16/7973   After Visit Summary: (MyChart) Due to this being a telephonic visit, the after visit summary with patients personalized plan was offered to patient via MyChart   Nurse Notes: None

## 2023-10-02 DIAGNOSIS — L820 Inflamed seborrheic keratosis: Secondary | ICD-10-CM | POA: Diagnosis not present

## 2023-10-03 ENCOUNTER — Other Ambulatory Visit: Payer: Self-pay | Admitting: Family Medicine

## 2023-10-08 ENCOUNTER — Other Ambulatory Visit: Payer: Self-pay | Admitting: Family Medicine

## 2023-10-08 ENCOUNTER — Other Ambulatory Visit: Payer: Self-pay | Admitting: Cardiovascular Disease

## 2023-11-10 DIAGNOSIS — H16143 Punctate keratitis, bilateral: Secondary | ICD-10-CM | POA: Diagnosis not present

## 2023-11-27 DIAGNOSIS — L820 Inflamed seborrheic keratosis: Secondary | ICD-10-CM | POA: Diagnosis not present

## 2023-12-23 DIAGNOSIS — G43719 Chronic migraine without aura, intractable, without status migrainosus: Secondary | ICD-10-CM | POA: Diagnosis not present

## 2024-01-04 NOTE — Progress Notes (Signed)
 Patient ID: Brenda Lara, female   DOB: 22-Apr-1951, 73 y.o.   MRN: [id number]     73 y.o. with distant history of CAD. Stenting of RCA in 2006 and LAD in 2005. Last cath in 2010 stents widely patent Last ETT 07/10/20 was normal. Statin changed to Lipitor in February 2019 with target LDL reached. Colon cancer in remission post colectomy EF is 55% with some apical hypokinesis but has not had echo in long time.   No angina Compliant with meds  Anginal equivalent is inner arm pain  Varicosities Venous duplex LLE negative for DVT March 2019  Husband retired and home more Seems to cramp her life style.    Some chronic lower back pain had disc surgery in her 30's Xray 04/03/23 with L45 degenerative changes   Has had heme  positive stool 06/2021 with normal EGD and some diverticula and hemorrhoids on scopes by Dr Elvin Hammer  Post head trauma 08/2022 and headaches with cataract surgery 09/17/22 CT head negative acute findings   Seen by PA 05/2023 with ? Angina Myouve 04/02/23 normal perfusion EF > 65%      ROS: Denies fever, malais, weight loss, blurry vision, decreased visual acuity, cough, sputum, SOB, hemoptysis, pleuritic pain, palpitaitons, heartburn, abdominal pain, melena, lower extremity edema, claudication, or rash.  All other systems reviewed and negative  General: BP (!) 160/76   Pulse (!) 54   Ht 5\' 1"  (1.549 m)   Wt 134 lb 9.6 oz (61.1 kg)   SpO2 97%   BMI 25.43 kg/m  Affect appropriate Healthy:  appears stated age HEENT: normal Neck supple with no adenopathy JVP normal no bruits no thyromegaly Lungs clear with no wheezing and good diaphragmatic motion Heart:  S1/S2 no murmur, no rub, gallop or click PMI normal Abdomen: benighn, BS positve, no tenderness, no AAA post colectomy  no bruit.  No HSM or HJR Distal pulses intact with no bruits No edema Neuro non-focal Skin warm and dry No muscular weakness   Current Outpatient Medications  Medication Sig Dispense Refill    ASPIRIN  LOW DOSE 81 MG tablet TAKE 1 TABLET (81 MG TOTAL) BY MOUTH DAILY. SWALLOW WHOLE. 90 tablet 3   atenolol  (TENORMIN ) 50 MG tablet TAKE 1 TABLET BY MOUTH TWICE A DAY 180 tablet 0   atorvastatin  (LIPITOR) 40 MG tablet TAKE 1 TABLET BY MOUTH EVERY DAY 90 tablet 2   Cholecalciferol (VITAMIN D3) 50 MCG (2000 UT) capsule Take 1 capsule (2,000 Units total) by mouth daily.     cycloSPORINE (RESTASIS) 0.05 % ophthalmic emulsion 1 drop 2 (two) times daily.     meclizine  (ANTIVERT ) 25 MG tablet TAKE 1 TABLET BY MOUTH 3 TIMES DAILY AS NEEDED FOR DIZZINESS. 90 tablet 0   Multiple Vitamin (MULTIVITAMIN) tablet Take 1 tablet by mouth daily.     nitroGLYCERIN  (NITROSTAT ) 0.4 MG SL tablet Place 1 tablet (0.4 mg total) under the tongue every 5 (five) minutes as needed for chest pain. 25 tablet 3   omeprazole  (PRILOSEC) 40 MG capsule Take 1 capsule (40 mg total) by mouth daily. 90 capsule 1   tiZANidine  (ZANAFLEX ) 2 MG tablet Take 0.5-2 tablets (1-4 mg total) by mouth every 6 (six) hours as needed for muscle spasms. 30 tablet 1   topiramate (TOPAMAX) 100 MG tablet Take 100 mg by mouth daily.      valACYclovir (VALTREX) 1000 MG tablet Take 1,000 mg by mouth as needed (cold sores).     No current facility-administered medications for this  visit.    Allergies  Patient has no known allergies.  Electrocardiogram:  06/21/20 SR rate 58 normal 01/18/2024 NSR normal   Assessment and Plan  CAD:  Distant history of RCA/LAD stenting  Patent by cath in 2010.  No angina, normal ECG ETT last 07/10/20 normal Continue medical RX  SL nitro refilled Myovue done 04/02/23 normal perfusion EF > 65%   Chol:  Changed to Lipitor 10/2017 with target LDL reached  Cholesterol is at goal.  Continue current dose of statin and diet Rx.  No myalgias or side effects.  F/U  LFT's in 6 months. Lab Results  Component Value Date   LDLCALC 51 06/23/2023   Colon Cancer:  benign colonsocopy 07/01/21 f/u Elvin Hammer   GERD:  Continue zantac  as  needed Benign EGD 07/01/21   Vertigo:  Infrequent has antivert  as needed   Myalgias:  LE;s pulses are normal  tolerating lipitor  F/U primary ? R/o lumbar stenosis xray 04/03/23 with lumbosacral degenerative changes PT/OT Has cervical spine dx as well by MRI 04/28/23   F/U in a year   Janelle Mediate

## 2024-01-18 ENCOUNTER — Encounter: Payer: Self-pay | Admitting: Cardiovascular Disease

## 2024-01-18 ENCOUNTER — Ambulatory Visit: Payer: Medicare Other | Attending: Cardiovascular Disease | Admitting: Cardiovascular Disease

## 2024-01-18 VITALS — BP 160/76 | HR 54 | Ht 61.0 in | Wt 134.6 lb

## 2024-01-18 DIAGNOSIS — E785 Hyperlipidemia, unspecified: Secondary | ICD-10-CM

## 2024-01-18 DIAGNOSIS — I10 Essential (primary) hypertension: Secondary | ICD-10-CM

## 2024-01-18 DIAGNOSIS — I2510 Atherosclerotic heart disease of native coronary artery without angina pectoris: Secondary | ICD-10-CM

## 2024-01-18 NOTE — Patient Instructions (Signed)

## 2024-01-20 ENCOUNTER — Other Ambulatory Visit: Payer: Self-pay | Admitting: Family Medicine

## 2024-01-26 ENCOUNTER — Other Ambulatory Visit: Payer: Self-pay | Admitting: Family Medicine

## 2024-02-15 DIAGNOSIS — H16143 Punctate keratitis, bilateral: Secondary | ICD-10-CM | POA: Diagnosis not present

## 2024-02-29 ENCOUNTER — Ambulatory Visit: Admitting: Podiatry

## 2024-02-29 ENCOUNTER — Encounter: Payer: Self-pay | Admitting: Podiatry

## 2024-02-29 DIAGNOSIS — B351 Tinea unguium: Secondary | ICD-10-CM | POA: Diagnosis not present

## 2024-02-29 NOTE — Addendum Note (Signed)
 Addended by: Theresia Flasher on: 02/29/2024 05:02 PM   Modules accepted: Orders

## 2024-02-29 NOTE — Progress Notes (Signed)
  Subjective:  Patient ID: Brenda Lara, female    DOB: 12-27-1950,   MRN: [id number]  Chief Complaint  Patient presents with   Nail Problem    Rm7 nail thick and yellow discoloration/  and lifting  from nail bed    73 y.o. female presents for concern of bilateral hallux nails thickened and discolored and lifting from the nail bed. Denies diabetes.  Denies any treatments. Recently left hallux nail popped off. . Denies any other pedal complaints. Denies n/v/f/c.   Past Medical History:  Diagnosis Date   Allergy    Anemia    Angina pectoris    Anxiety    Cataract    Cervical cancer screening 01/31/2016   Menarche at 12 Regular and moderate flow No history of abnormal pap in past G1P1, s/p 1 SVD No history of abnormal MGM No concerns today gyn surgeries b/l tubal ligation and b/y oophorectomy   Colon cancer (HCC) 2010   Coronary artery disease 2001/2005   S/P stenting to RCA and LAD   GERD (gastroesophageal reflux disease)    Hearing loss 08/05/2015   History of chicken pox    HTN (hypertension)    Menopause    Preventative health care 01/31/2016   Pure hypercholesterolemia    Vitamin D  deficiency 06/01/2017    Objective:  Physical Exam: Vascular: DP/PT pulses 2/4 bilateral. CFT <3 seconds. Normal hair growth on digits. No edema.  Skin. No lacerations or abrasions bilateral feet. Bialteral hallux nails thickened and dystrophic with subungual debris  Musculoskeletal: MMT 5/5 bilateral lower extremities in DF, PF, Inversion and Eversion. Deceased ROM in DF of ankle joint.  Neurological: Sensation intact to light touch.   Assessment:   1. Onychomycosis      Plan:  Patient was evaluated and treated and all questions answered. -Examined patient -Discussed treatment options for painful dystrophic nails  -Clinical picture and Fungal culture was obtained by removing a portion of the hard nail itself from each of the involved toenails using a sterile nail nipper and sent to  [facility] lab. Patient tolerated the biopsy procedure well without discomfort or need for anesthesia.  -Discussed fungal nail treatment options including oral, topical, and laser treatments.  -Patient to return in 4 weeks for follow up evaluation and discussion of fungal culture results or sooner if symptoms worsen.   Jennefer Moats, DPM

## 2024-03-15 ENCOUNTER — Other Ambulatory Visit: Payer: Self-pay | Admitting: Podiatry

## 2024-03-28 ENCOUNTER — Ambulatory Visit (INDEPENDENT_AMBULATORY_CARE_PROVIDER_SITE_OTHER): Admitting: Podiatry

## 2024-03-28 DIAGNOSIS — Z91199 Patient's noncompliance with other medical treatment and regimen due to unspecified reason: Secondary | ICD-10-CM

## 2024-03-28 NOTE — Progress Notes (Signed)
 No show

## 2024-04-01 ENCOUNTER — Encounter: Payer: Self-pay | Admitting: Student

## 2024-04-01 DIAGNOSIS — I2510 Atherosclerotic heart disease of native coronary artery without angina pectoris: Secondary | ICD-10-CM | POA: Insufficient documentation

## 2024-04-01 DIAGNOSIS — M810 Age-related osteoporosis without current pathological fracture: Secondary | ICD-10-CM | POA: Insufficient documentation

## 2024-04-01 NOTE — Progress Notes (Signed)
 Subjective:     Patient ID: Brenda Lara, female    DOB: [birth year], 73 y.o.   MRN: [id number]  Chief Complaint  Patient presents with   Annual Exam    Pt states fasting     HPI    [name] a 73 y.o.  female presents today for a complete physical exam. Pt reports consuming a general diet. The patient does not participate in regular exercise at present. 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 Reports sleeping well.  does have additional problems to discuss today.  Denies ETOH and smoking.   Dentist- goes every 6 months Vision- Sees eye center in [place]- wears glasses Dermatology-The patient has a history of melanoma and precancerous skin conditions-followed by dermatology-patient regularly has skin assessments. Followed by dermatology, cardiology, podiatry, orthopedics  New Surgeries or Family Hx: Denies Habits: ETOH, illicit drugs, smoking, secondhand exposure, caffeine    HCM:   -Colonoscopy- Hx of colon cancer,and adenomatous colon polyps; Last colonoscopy 06/2021. Repeat 5 years (2027)  MGM- Last screen 9/ 2024, Repeat 2025. -Dexa 2021- Osteoporosis-   CAD/HLD Aspirin  81 mg daily Lipitor 40 mg daily  HTN atenolol  50 mg twice daily BP at home- 110-120/ 50-60s  GERD Omeprazole  40 mg daily  Vitamin D  deficiency Cholecalciferol (vitamin D3) 2000 units daily  Compliment with medications  Reports intermittent brief dizziness/lightheadedness, often when turning; denies room-spinning vertigo though she has a prior history of vertigo. Works outdoors in the [employer] on [address]. History of seasonal allergies; takes daily OTC loratadine.  Patient says she is unable to take Flonase  due to nosebleeding.  Denies syncope, denies falls.  Patient denies fever, chills, SOB, CP, palpitations, dyspnea, edema, HA, vision changes, N/V/D, abdominal pain, urinary symptoms,  rash, weight changes, and recent illness or hospitalizations.   History of Present Illness              Health Maintenance Due  Topic Date Due   DTaP/Tdap/Td (2 - Td or Tdap) 07/01/2021   COVID-19 Vaccine (5 - 2024-25 season) 05/17/2023    Past Medical History:  Diagnosis Date   Allergy    Anemia    Angina pectoris    Anxiety    Cataract    Cervical cancer screening 01/31/2016   Menarche at 12 Regular and moderate flow No history of abnormal pap in past G1P1, s/p 1 SVD No history of abnormal MGM No concerns today gyn surgeries b/l tubal ligation and b/y oophorectomy   Colon cancer (HCC) 2010   Coronary artery disease 2001/2005   S/P stenting to RCA and LAD   GERD (gastroesophageal reflux disease)    Hearing loss 08/05/2015   History of chicken pox    HTN (hypertension)    Menopause    Preventative health care 01/31/2016   Pure hypercholesterolemia    Vitamin D  deficiency 06/01/2017    Past Surgical History:  Procedure Laterality Date   APPENDECTOMY  02/21/2009   BACK SURGERY  1978, 1988   multiple   BILATERAL OOPHORECTOMY  09/15/2008   CHOLECYSTECTOMY  07/16/2008   COLON SURGERY  09/15/2008   right colectomy   COLONOSCOPY  01/26/2014   NORMAL    CORONARY STENT PLACEMENT  2001/2005   RCA   CYSTECTOMY     left ovary   EYE SURGERY Bilateral 2024   L cataract 2016, R 2024   MELANOMA  EXCISION Right 03/05/2022   right lower leg lesion removed    Family History  Problem Relation Age of Onset   Colon polyps Mother    Hypertension Mother    Transient ischemic attack Mother    Heart attack Mother        age 92   Heart disease Mother    Leukemia Mother    Heart attack Father        43's   Heart disease Father    Alcohol abuse Father    Arthritis Father        mva   Prostate cancer Father    Thyroid  disease Daughter    Coronary artery disease Other    Colon cancer Neg Hx    Pancreatic cancer Neg Hx    Rectal cancer Neg Hx    Stomach cancer Neg Hx     Esophageal cancer Neg Hx     Social History   Socioeconomic History   Marital status: Married    Spouse name: Not on file   Number of children: Not on file   Years of education: Not on file   Highest education level: Associate degree: occupational, Scientist, product/process development, or vocational program  Occupational History   Not on file  Tobacco Use   Smoking status: Never   Smokeless tobacco: Never  Vaping Use   Vaping status: Never Used  Substance and Sexual Activity   Alcohol use: No   Drug use: No   Sexual activity: Yes    Birth control/protection: Post-menopausal    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, 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  Other Topics Concern   Not on file  Social History Narrative   Not on file   Social Drivers of Health   Financial Resource Strain: Low Risk  (04/04/2024)   Overall Financial Resource Strain (CARDIA)    Difficulty of Paying Living Expenses: Not hard at all  Food Insecurity: No Food Insecurity (04/04/2024)   Hunger Vital Sign    Worried About Running Out of Food in the Last Year: Never true    Ran Out of Food in the Last Year: Never true  Transportation Needs: No Transportation Needs (04/04/2024)   PRAPARE - Administrator, Civil Service (Medical): No    Lack of Transportation (Non-Medical): No  Physical Activity: Sufficiently Active (04/04/2024)   Exercise Vital Sign    Days of Exercise per Week: 4 days    Minutes of Exercise per Session: 90 min  Stress: No Stress Concern Present (04/04/2024)   [employer] of Occupational Health - Occupational Stress Questionnaire    Feeling of Stress: Not at all  Social Connections: Socially Integrated (04/04/2024)   Social Connection and Isolation Panel    Frequency of Communication with Friends and Family: More than three times a week    Frequency of Social Gatherings with Friends and Family: More than three times a week    Attends Religious Services: More than 4 times per year    Active Member of  [employer] or Organizations: Yes    Attends Banker Meetings: More than 4 times per year    Marital Status: Married  Catering manager Violence: Not At Risk (09/22/2023)   Humiliation, Afraid, Rape, and Kick questionnaire    Fear of Current or Ex-Partner: No    Emotionally Abused: No    Physically Abused: No    Sexually Abused: No    Outpatient Medications Prior to Visit  Medication Sig Dispense Refill  ASPIRIN  LOW DOSE 81 MG tablet TAKE 1 TABLET (81 MG TOTAL) BY MOUTH DAILY. SWALLOW WHOLE. 90 tablet 3   atenolol  (TENORMIN ) 50 MG tablet Take 1 tablet (50 mg total) by mouth 2 (two) times daily. 180 tablet 0   atorvastatin  (LIPITOR) 40 MG tablet TAKE 1 TABLET BY MOUTH EVERY DAY 90 tablet 2   Cholecalciferol (VITAMIN D3) 50 MCG (2000 UT) capsule Take 1 capsule (2,000 Units total) by mouth daily.     cycloSPORINE (RESTASIS) 0.05 % ophthalmic emulsion 1 drop 2 (two) times daily.     meclizine  (ANTIVERT ) 25 MG tablet TAKE 1 TABLET BY MOUTH 3 TIMES DAILY AS NEEDED FOR DIZZINESS. 90 tablet 0   Multiple Vitamin (MULTIVITAMIN) tablet Take 1 tablet by mouth daily.     nitroGLYCERIN  (NITROSTAT ) 0.4 MG SL tablet Place 1 tablet (0.4 mg total) under the tongue every 5 (five) minutes as needed for chest pain. 25 tablet 3   terbinafine  (LAMISIL ) 250 MG tablet Take 1 tablet (250 mg total) by mouth daily. 30 tablet 2   tiZANidine  (ZANAFLEX ) 2 MG tablet Take 0.5-2 tablets (1-4 mg total) by mouth every 6 (six) hours as needed for muscle spasms. 30 tablet 1   topiramate (TOPAMAX) 100 MG tablet Take 100 mg by mouth daily.      valACYclovir (VALTREX) 1000 MG tablet Take 1,000 mg by mouth as needed (cold sores).     omeprazole  (PRILOSEC) 40 MG capsule TAKE 1 CAPSULE (40 MG TOTAL) BY MOUTH DAILY. 90 capsule 1   No facility-administered medications prior to visit.    No Known Allergies  ROS See HPI    Objective:    Physical Exam Constitutional:      General: She is not in acute distress.     Appearance: She is not ill-appearing, toxic-appearing or diaphoretic.  HENT:     Head: Normocephalic and atraumatic.     Right Ear: Tympanic membrane, ear canal and external ear normal.     Left Ear: Tympanic membrane, ear canal and external ear normal.     Nose: Nose normal. No congestion.     Mouth/Throat:     Mouth: Mucous membranes are moist.     Pharynx: Oropharynx is clear.  Eyes:     Extraocular Movements: Extraocular movements intact.     Right eye: Normal extraocular motion.     Left eye: Normal extraocular motion.     Conjunctiva/sclera: Conjunctivae normal.     Pupils: Pupils are equal, round, and reactive to light.  Neck:     Thyroid : No thyroid  mass or thyromegaly.     Vascular: No carotid bruit or JVD.  Cardiovascular:     Rate and Rhythm: Normal rate and regular rhythm.     Pulses: Normal pulses.          Radial pulses are 2+ on the right side and 2+ on the left side.       Dorsalis pedis pulses are 2+ on the right side and 2+ on the left side.     Heart sounds: Normal heart sounds, S1 normal and S2 normal. No murmur heard.    No friction rub. No gallop.  Pulmonary:     Effort: Pulmonary effort is normal. No respiratory distress.     Breath sounds: Normal breath sounds.  Abdominal:     General: Bowel sounds are normal. There is no distension.     Palpations: Abdomen is soft.     Tenderness: There is no abdominal tenderness. There is  no guarding.  Musculoskeletal:        General: Normal range of motion.     Cervical back: Full passive range of motion without pain and normal range of motion. No edema or erythema.     Right lower leg: No edema.     Left lower leg: No edema.  Lymphadenopathy:     Cervical: No cervical adenopathy.  Skin:    General: Skin is warm and dry.     Capillary Refill: Capillary refill takes less than 2 seconds.  Neurological:     General: No focal deficit present.     Mental Status: She is alert and oriented to person, place, and time.      Cranial Nerves: No cranial nerve deficit.     Motor: No weakness.     Coordination: Coordination normal.     Gait: Gait normal.     Deep Tendon Reflexes: Reflexes normal.  Psychiatric:        Mood and Affect: Mood normal.        Behavior: Behavior normal.        Thought Content: Thought content normal.        BP (!) 162/80 (BP Location: Left Arm, Patient Position: Sitting, Cuff Size: Normal)   Pulse 66   Temp 97.8 F (36.6 C) (Oral)   Resp 16   Ht 5' 1 (1.549 m)   Wt 132 lb 12.8 oz (60.2 kg)   SpO2 99%   BMI 25.09 kg/m   Wt Readings from Last 3 Encounters:  04/06/24 132 lb 12.8 oz (60.2 kg)  01/18/24 134 lb 9.6 oz (61.1 kg)  09/22/23 133 lb (60.3 kg)       Assessment & Plan:   Problem List Items Addressed This Visit     Anemia - Primary   Update CBC today.      Relevant Orders   CBC with Differential/Platelet   Essential hypertension   Blood pressure well-controlled at home.  Patient is followed by cardiology.  Stable on current medications.  History of whitecoat hypertension, blood pressure elevated in office visit today and with cardiology but home readings SBP 120-130s/ DBP 50-60s.       Relevant Orders   TSH   GERD   Followed by GI.  Will trial decrease of omeprazole  to 20 mg daily.  Patient to update on MyChart and if not tolerant resume 40 mg daily.      Relevant Medications   omeprazole  (PRILOSEC) 20 MG capsule   HYPERCHOLESTEROLEMIA   Encourage heart healthy diet such as MIND or DASH diet, increase exercise, avoid trans fats, simple carbohydrates and processed foods, consider a krill or fish or flaxseed oil cap daily. Tolerating Atorvastatin         Relevant Orders   Comprehensive metabolic panel with GFR   Lipid panel   Hyperglycemia   Relevant Orders   Comprehensive metabolic panel with GFR   HgB J8r   Osteoporosis   Encouraged daily intake of calcium  and vitamin D . Advised engaging in regular exercise as tolerated to support overall  bone and CV health. Consider Fosamax.       Preventative health care   Vitamin D  deficiency   Relevant Orders   VITAMIN D  25 Hydroxy (Vit-D Deficiency, Fractures)    Episodic Nausea: Occasional episodes of nausea and loss of appetite, more frequent in the past week. No clear pattern related to meals. No other gastrointestinal symptoms reported. -Continue current management with Emetrol as needed.   Cardiac History:  History of coronary artery disease with stent placements. Follows with cardiology. No associated shortness of breath, sweating, or palpitations reported.  Portions of this note were dictated using DRAGON voice recognition software. Please disregard any errors in transcription.    I have discontinued Brenda A. Ruby's omeprazole . I am also having her start on omeprazole  and azelastine . Additionally, I am having her maintain her topiramate, multivitamin, nitroGLYCERIN , tiZANidine , Vitamin D3, atorvastatin , meclizine , Aspirin  Low Dose, cycloSPORINE, valACYclovir, atenolol , and terbinafine .  Meds ordered this encounter  Medications   omeprazole  (PRILOSEC) 20 MG capsule    Sig: Take 1 capsule (20 mg total) by mouth daily.    Dispense:  30 capsule    Refill:  1    Supervising Provider:   DOMENICA BLACKBIRD A [[id number]]   azelastine  (ASTELIN ) 0.1 % nasal spray    Sig: Place 1 spray into both nostrils 2 (two) times daily. Use in each nostril as directed    Dispense:  30 mL    Refill:  1    Supervising Provider:   DOMENICA BLACKBIRD A [[id number]]

## 2024-04-04 ENCOUNTER — Encounter: Payer: Self-pay | Admitting: Podiatry

## 2024-04-04 ENCOUNTER — Ambulatory Visit: Admitting: Podiatry

## 2024-04-04 DIAGNOSIS — B351 Tinea unguium: Secondary | ICD-10-CM | POA: Diagnosis not present

## 2024-04-04 MED ORDER — TERBINAFINE HCL 250 MG PO TABS: 250.0000 mg | ORAL_TABLET | Freq: Every day | ORAL | 2 refills | Status: AC

## 2024-04-04 NOTE — Progress Notes (Signed)
  Subjective:  Patient ID: Brenda Lara, female    DOB: [birth year],   MRN: [id number]  Chief Complaint  Patient presents with   Nail Problem    They're about the same, nothing has changed.  They still look nasty.    73 y.o. female presents to discuss fungal nails and culture results. . Denies any other pedal complaints. Denies n/v/f/c.   Past Medical History:  Diagnosis Date   Allergy    Anemia    Angina pectoris    Anxiety    Cataract    Cervical cancer screening 01/31/2016   Menarche at 12 Regular and moderate flow No history of abnormal pap in past G1P1, s/p 1 SVD No history of abnormal MGM No concerns today gyn surgeries b/l tubal ligation and b/y oophorectomy   Colon cancer (HCC) 2010   Coronary artery disease 2001/2005   S/P stenting to RCA and LAD   GERD (gastroesophageal reflux disease)    Hearing loss 08/05/2015   History of chicken pox    HTN (hypertension)    Menopause    Preventative health care 01/31/2016   Pure hypercholesterolemia    Vitamin D  deficiency 06/01/2017    Objective:  Physical Exam: Vascular: DP/PT pulses 2/4 bilateral. CFT <3 seconds. Normal hair growth on digits. No edema.  Skin. No lacerations or abrasions bilateral feet. Bialteral hallux nails thickened and dystrophic with subungual debris  Musculoskeletal: MMT 5/5 bilateral lower extremities in DF, PF, Inversion and Eversion. Deceased ROM in DF of ankle joint.  Neurological: Sensation intact to light touch.   Assessment:   1. Onychomycosis       Plan:  Patient was evaluated and treated and all questions answered. -Examined patient -Discussed treatment options for painful dystrophic nails  -Culture positive for fusarium solea.  -Discussed fungal nail treatment options including oral, topical, and laser treatments.  -Patient would like to consider lamisil .  -Most recent LFTS winl.  -Patient to return in 3 months for recheck.    Asberry Failing, DPM

## 2024-04-05 ENCOUNTER — Encounter: Payer: Medicare Other | Admitting: Physician Assistant

## 2024-04-05 ENCOUNTER — Encounter: Payer: Medicare Other | Admitting: Family Medicine

## 2024-04-06 ENCOUNTER — Ambulatory Visit: Admitting: Student

## 2024-04-06 ENCOUNTER — Ambulatory Visit: Payer: Self-pay | Admitting: Student

## 2024-04-06 ENCOUNTER — Encounter: Payer: Self-pay | Admitting: Student

## 2024-04-06 VITALS — BP 162/80 | HR 66 | Temp 97.8°F | Resp 16 | Ht 61.0 in | Wt 132.8 lb

## 2024-04-06 DIAGNOSIS — E559 Vitamin D deficiency, unspecified: Secondary | ICD-10-CM | POA: Diagnosis not present

## 2024-04-06 DIAGNOSIS — R739 Hyperglycemia, unspecified: Secondary | ICD-10-CM

## 2024-04-06 DIAGNOSIS — Z0000 Encounter for general adult medical examination without abnormal findings: Secondary | ICD-10-CM

## 2024-04-06 DIAGNOSIS — I10 Essential (primary) hypertension: Secondary | ICD-10-CM

## 2024-04-06 DIAGNOSIS — M810 Age-related osteoporosis without current pathological fracture: Secondary | ICD-10-CM | POA: Diagnosis not present

## 2024-04-06 DIAGNOSIS — K219 Gastro-esophageal reflux disease without esophagitis: Secondary | ICD-10-CM

## 2024-04-06 DIAGNOSIS — D649 Anemia, unspecified: Secondary | ICD-10-CM | POA: Diagnosis not present

## 2024-04-06 DIAGNOSIS — E7800 Pure hypercholesterolemia, unspecified: Secondary | ICD-10-CM | POA: Diagnosis not present

## 2024-04-06 LAB — CBC WITH DIFFERENTIAL/PLATELET
Basophils Absolute: 0 K/uL (ref 0.0–0.1)
Basophils Relative: 0.5 % (ref 0.0–3.0)
Eosinophils Absolute: 0.1 K/uL (ref 0.0–0.7)
Eosinophils Relative: 1 % (ref 0.0–5.0)
HCT: 37 % (ref 36.0–46.0)
Hemoglobin: 12 g/dL (ref 12.0–15.0)
Lymphocytes Relative: 20 % (ref 12.0–46.0)
Lymphs Abs: 1.3 K/uL (ref 0.7–4.0)
MCHC: 32.4 g/dL (ref 30.0–36.0)
MCV: 82.1 fl (ref 78.0–100.0)
Monocytes Absolute: 0.6 K/uL (ref 0.1–1.0)
Monocytes Relative: 9.5 % (ref 3.0–12.0)
Neutro Abs: 4.5 K/uL (ref 1.4–7.7)
Neutrophils Relative %: 69 % (ref 43.0–77.0)
Platelets: 288 K/uL (ref 150.0–400.0)
RBC: 4.5 Mil/uL (ref 3.87–5.11)
RDW: 13.9 % (ref 11.5–15.5)
WBC: 6.6 K/uL (ref 4.0–10.5)

## 2024-04-06 LAB — LIPID PANEL
Cholesterol: 126 mg/dL (ref 0–200)
HDL: 67.7 mg/dL (ref >39.00)
LDL Cholesterol: 42 mg/dL (ref 0–99)
NonHDL: 57.83
Total CHOL/HDL Ratio: 2
Triglycerides: 80 mg/dL (ref 0.0–149.0)
VLDL: 16 mg/dL (ref 0.0–40.0)

## 2024-04-06 LAB — COMPREHENSIVE METABOLIC PANEL WITH GFR
ALT: 19 U/L (ref 0–35)
AST: 23 U/L (ref 0–37)
Albumin: 4.3 g/dL (ref 3.5–5.2)
Alkaline Phosphatase: 108 U/L (ref 39–117)
BUN: 18 mg/dL (ref 6–23)
CO2: 26 meq/L (ref 19–32)
Calcium: 9.5 mg/dL (ref 8.4–10.5)
Chloride: 103 meq/L (ref 96–112)
Creatinine, Ser: 0.74 mg/dL (ref 0.40–1.20)
GFR: 80.72 mL/min (ref >60.00)
Glucose, Bld: 88 mg/dL (ref 70–99)
Potassium: 5.1 meq/L (ref 3.5–5.1)
Sodium: 136 meq/L (ref 135–145)
Total Bilirubin: 0.6 mg/dL (ref 0.2–1.2)
Total Protein: 6.9 g/dL (ref 6.0–8.3)

## 2024-04-06 LAB — TSH: TSH: 2.68 u[IU]/mL (ref 0.35–5.50)

## 2024-04-06 LAB — HEMOGLOBIN A1C: Hgb A1c MFr Bld: 6.3 % (ref 4.6–6.5)

## 2024-04-06 LAB — VITAMIN D 25 HYDROXY (VIT D DEFICIENCY, FRACTURES): VITD: 40.49 ng/mL (ref 30.00–100.00)

## 2024-04-06 MED ORDER — OMEPRAZOLE 20 MG PO CPDR: 20.0000 mg | DELAYED_RELEASE_CAPSULE | Freq: Every day | ORAL | 1 refills | Status: DC

## 2024-04-06 MED ORDER — AZELASTINE HCL 0.1 % NA SOLN: 1.0000 | Freq: Two times a day (BID) | NASAL | 1 refills | Status: DC

## 2024-04-06 NOTE — Assessment & Plan Note (Signed)
 Encouraged daily intake of calcium  and vitamin D . Advised engaging in regular exercise as tolerated to support overall bone and CV health. Consider Fosamax.

## 2024-04-06 NOTE — Assessment & Plan Note (Signed)
 Followed by GI.  Will trial decrease of omeprazole  to 20 mg daily.  Patient to update on MyChart and if not tolerant resume 40 mg daily.

## 2024-04-06 NOTE — Assessment & Plan Note (Signed)
Update CBC today. 

## 2024-04-06 NOTE — Assessment & Plan Note (Signed)
 Blood pressure well-controlled at home.  Patient is followed by cardiology.  Stable on current medications.  History of whitecoat hypertension, blood pressure elevated in office visit today and with cardiology but home readings SBP 120-130s/ DBP 50-60s.

## 2024-04-06 NOTE — Assessment & Plan Note (Signed)
 Encourage heart healthy diet such as MIND or DASH diet, increase exercise, avoid trans fats, simple carbohydrates and processed foods, consider a krill or fish or flaxseed oil cap daily. Tolerating Atorvastatin

## 2024-04-14 ENCOUNTER — Other Ambulatory Visit: Payer: Self-pay | Admitting: Family Medicine

## 2024-04-26 ENCOUNTER — Other Ambulatory Visit: Payer: Self-pay | Admitting: Family Medicine

## 2024-04-26 DIAGNOSIS — Z1231 Encounter for screening mammogram for malignant neoplasm of breast: Secondary | ICD-10-CM

## 2024-04-28 ENCOUNTER — Other Ambulatory Visit: Payer: Self-pay | Admitting: Student

## 2024-04-28 DIAGNOSIS — K219 Gastro-esophageal reflux disease without esophagitis: Secondary | ICD-10-CM

## 2024-05-30 ENCOUNTER — Other Ambulatory Visit: Payer: Self-pay | Admitting: Cardiovascular Disease

## 2024-05-30 MED ORDER — ATORVASTATIN CALCIUM 40 MG PO TABS: 40.0000 mg | ORAL_TABLET | Freq: Every day | ORAL | 3 refills | Status: AC

## 2024-06-17 DIAGNOSIS — H11153 Pinguecula, bilateral: Secondary | ICD-10-CM | POA: Diagnosis not present

## 2024-06-17 DIAGNOSIS — H43811 Vitreous degeneration, right eye: Secondary | ICD-10-CM | POA: Diagnosis not present

## 2024-06-20 ENCOUNTER — Ambulatory Visit
Admission: RE | Admit: 2024-06-20 | Discharge: 2024-06-20 | Disposition: A | Source: Ambulatory Visit | Attending: Family Medicine | Admitting: Family Medicine

## 2024-06-20 DIAGNOSIS — Z1231 Encounter for screening mammogram for malignant neoplasm of breast: Secondary | ICD-10-CM | POA: Diagnosis not present

## 2024-06-20 DIAGNOSIS — G43719 Chronic migraine without aura, intractable, without status migrainosus: Secondary | ICD-10-CM | POA: Diagnosis not present

## 2024-06-23 ENCOUNTER — Other Ambulatory Visit: Payer: Self-pay | Admitting: Podiatry

## 2024-06-23 ENCOUNTER — Other Ambulatory Visit: Payer: Self-pay | Admitting: Family Medicine

## 2024-06-23 DIAGNOSIS — R928 Other abnormal and inconclusive findings on diagnostic imaging of breast: Secondary | ICD-10-CM

## 2024-06-30 ENCOUNTER — Encounter: Payer: Self-pay | Admitting: Student

## 2024-06-30 DIAGNOSIS — K219 Gastro-esophageal reflux disease without esophagitis: Secondary | ICD-10-CM

## 2024-07-04 ENCOUNTER — Ambulatory Visit
Admission: RE | Admit: 2024-07-04 | Discharge: 2024-07-04 | Disposition: A | Discharge status: Home / Self Care | Source: Ambulatory Visit | Attending: Family Medicine | Admitting: Family Medicine

## 2024-07-04 DIAGNOSIS — R928 Other abnormal and inconclusive findings on diagnostic imaging of breast: Secondary | ICD-10-CM

## 2024-07-04 DIAGNOSIS — N6001 Solitary cyst of right breast: Secondary | ICD-10-CM | POA: Diagnosis not present

## 2024-07-05 ENCOUNTER — Ambulatory Visit: Admitting: Podiatry

## 2024-07-05 ENCOUNTER — Encounter: Payer: Self-pay | Admitting: Podiatry

## 2024-07-05 DIAGNOSIS — B351 Tinea unguium: Secondary | ICD-10-CM

## 2024-07-05 MED ORDER — TERBINAFINE HCL 250 MG PO TABS: 250.0000 mg | ORAL_TABLET | Freq: Every day | ORAL | 0 refills | Status: DC

## 2024-07-05 NOTE — Progress Notes (Signed)
  Subjective:  Patient ID: Brenda Lara, female    DOB: [birth year],   MRN: [id number]  Chief Complaint  Patient presents with   Nail Problem    I think I am making progress but it's hard to tell.  The right one seems to be growing but the left one, I really can't tell.    73 y.o. female presents for follow-up of fungal nails. She has been taking the Lamisil . Relates not sure if doing better. The right seems to be improving but not sure about left.  . . Denies any other pedal complaints. Denies n/v/f/c.   Past Medical History:  Diagnosis Date   Allergy    Anemia    Angina pectoris    Anxiety    Cataract    Cervical cancer screening 01/31/2016   Menarche at 12 Regular and moderate flow No history of abnormal pap in past G1P1, s/p 1 SVD No history of abnormal MGM No concerns today gyn surgeries b/l tubal ligation and b/y oophorectomy   Colon cancer (HCC) 2010   Coronary artery disease 2001/2005   S/P stenting to RCA and LAD   GERD (gastroesophageal reflux disease)    Hearing loss 08/05/2015   History of chicken pox    HTN (hypertension)    Menopause    Preventative health care 01/31/2016   Pure hypercholesterolemia    Vitamin D  deficiency 06/01/2017    Objective:  Physical Exam: Vascular: DP/PT pulses 2/4 bilateral. CFT <3 seconds. Normal hair growth on digits. No edema.  Skin. No lacerations or abrasions bilateral feet. Bialteral hallux nails thickened and dystrophic with subungual debris. Some improvement noted to proximal aspects of nail Musculoskeletal: MMT 5/5 bilateral lower extremities in DF, PF, Inversion and Eversion. Deceased ROM in DF of ankle joint.  Neurological: Sensation intact to light touch.   Assessment:   1. Onychomycosis        Plan:  Patient was evaluated and treated and all questions answered. -Examined patient -Discussed treatment options for painful dystrophic nails  -Culture positive for fusarium solea.  -Discussed fungal nail treatment  options including oral, topical, and laser treatments.  -lamisil  refilled for three more months.  -Patient to return in 3 months for recheck.    Asberry Failing, DPM

## 2024-09-27 ENCOUNTER — Ambulatory Visit: Payer: Medicare Other

## 2024-09-27 ENCOUNTER — Other Ambulatory Visit: Payer: Self-pay | Admitting: Cardiovascular Disease

## 2024-09-27 VITALS — Ht 61.0 in | Wt 130.0 lb

## 2024-09-27 DIAGNOSIS — Z0000 Encounter for general adult medical examination without abnormal findings: Secondary | ICD-10-CM | POA: Diagnosis not present

## 2024-09-27 NOTE — Patient Instructions (Addendum)
 Brenda Lara,  Thank you for taking the time for your Medicare Wellness Visit. I appreciate your continued commitment to your health goals. Please review the care plan we discussed, and feel free to reach out if I can assist you further.  Please note that Annual Wellness Visits do not include a physical exam. Some assessments may be limited, especially if the visit was conducted virtually. If needed, we may recommend an in-person follow-up with your provider.  Ongoing Care Seeing your primary care provider every 3 to 6 months helps us  monitor your health and provide consistent, personalized care.   Referrals If a referral was made during today's visit and you haven't received any updates within two weeks, please contact the referred provider directly to check on the status.  Recommended Screenings:  Health Maintenance  Topic Date Due   DTaP/Tdap/Td vaccine (2 - Td or Tdap) 07/01/2021   Flu Shot  04/15/2024   COVID-19 Vaccine (5 - 2025-26 season) 05/16/2024   Breast Cancer Screening  06/20/2025   Medicare Annual Wellness Visit  09/27/2025   Colon Cancer Screening  07/01/2026   Pneumococcal Vaccine for age over 14  Completed   Osteoporosis screening with Bone Density Scan  Completed   Hepatitis C Screening  Completed   Zoster (Shingles) Vaccine  Completed   Meningitis B Vaccine  Aged Out       09/27/2024    3:12 PM  Advanced Directives  Does Patient Have a Medical Advance Directive? Yes  Type of Estate Agent of [place];Living will  Does patient want to make changes to medical advance directive? No - Patient declined  Copy of Healthcare Power of Attorney in Chart? No - copy requested    Vision: Annual vision screenings are recommended for early detection of glaucoma, cataracts, and diabetic retinopathy. These exams can also reveal signs of chronic conditions such as diabetes and high blood pressure.  Dental: Annual dental screenings help detect early signs  of oral cancer, gum disease, and other conditions linked to overall health, including heart disease and diabetes.  Please see the attached documents for additional preventive care recommendations.

## 2024-09-27 NOTE — Progress Notes (Signed)
 "  Chief Complaint  Patient presents with   Medicare Wellness     Subjective:   Brenda Lara is a 74 y.o. female who presents for a Medicare Annual Wellness Visit.  Visit info / Clinical Intake: Medicare Wellness Visit Type:: Subsequent Annual Wellness Visit Persons participating in visit and providing information:: patient Medicare Wellness Visit Mode:: Telephone If telephone:: video declined Since this visit was completed virtually, some vitals may be partially provided or unavailable. Missing vitals are due to the limitations of the virtual format.: Documented vitals are patient reported If Telephone or Video please confirm:: I connected with patient using audio/video enable telemedicine. I verified patient identity with two identifiers, discussed telehealth limitations, and patient agreed to proceed. Patient Location:: Home Provider Location:: Office Interpreter Needed?: No Pre-visit prep was completed: yes AWV questionnaire completed by patient prior to visit?: yes Date:: 09/20/24 Living arrangements:: lives with spouse/significant other Patient's Overall Health Status Rating: good Typical amount of pain: some Does pain affect daily life?: no Are you currently prescribed opioids?: no  Dietary Habits and Nutritional Risks How many meals a day?: 3 Eats fruit and vegetables daily?: yes Most meals are obtained by: preparing own meals In the last 2 weeks, have you had any of the following?: none Diabetic:: no  Functional Status Activities of Daily Living (to include ambulation/medication): Independent Ambulation: Independent with device- listed below Home Assistive Devices/Equipment: Eyeglasses Medication Administration: Independent Home Management (perform basic housework or laundry): Independent Manage your own finances?: yes Primary transportation is: driving Concerns about vision?: no *vision screening is required for WTM* Concerns about hearing?: no  Fall  Screening Falls in the past year?: 0 Number of falls in past year: 0 Was there an injury with Fall?: 0 Fall Risk Category Calculator: 0 Patient Fall Risk Level: Low Fall Risk  Fall Risk Patient at Risk for Falls Due to: No Fall Risks Fall risk Follow up: Falls evaluation completed  Home and Transportation Safety: All rugs have non-skid backing?: yes All stairs or steps have railings?: N/A, no stairs Grab bars in the bathtub or shower?: (!) no Have non-skid surface in bathtub or shower?: yes Good home lighting?: yes Regular seat belt use?: yes Hospital stays in the last year:: no  Cognitive Assessment Difficulty concentrating, remembering, or making decisions? : no Will 6CIT or Mini Cog be Completed: yes What year is it?: 0 points What month is it?: 0 points Give patient an address phrase to remember (5 components): [address] [place]  About what time is it?: 0 points Count backwards from 20 to 1: 0 points Say the months of the year in reverse: 0 points Repeat the address phrase from earlier: 0 points 6 CIT Score: 0 points  Advance Directives (For Healthcare) Does Patient Have a Medical Advance Directive?: Yes Does patient want to make changes to medical advance directive?: No - Patient declined Type of Advance Directive: Healthcare Power of [place]; Living will Copy of Healthcare Power of Attorney in Chart?: No - copy requested Copy of Living Will in Chart?: No - copy requested  Reviewed/Updated  Reviewed/Updated: Reviewed All (Medical, Surgical, Family, Medications, Allergies, Care Teams, Patient Goals)    Allergies (verified) Patient has no known allergies.   Current Medications (verified) Outpatient Encounter Medications as of 09/27/2024  Medication Sig   ASPIRIN  LOW DOSE 81 MG tablet TAKE 1 TABLET (81 MG TOTAL) BY MOUTH DAILY. SWALLOW WHOLE.   atenolol  (TENORMIN ) 50 MG tablet TAKE 1 TABLET BY MOUTH TWICE A DAY  atorvastatin  (LIPITOR) 40 MG tablet Take  1 tablet (40 mg total) by mouth daily.   Azelastine  HCl 137 MCG/SPRAY SOLN Place 1 spray into both nostrils 2 (two) times daily as needed.   Cholecalciferol (VITAMIN D3) 50 MCG (2000 UT) capsule Take 1 capsule (2,000 Units total) by mouth daily.   cycloSPORINE (RESTASIS) 0.05 % ophthalmic emulsion 1 drop 2 (two) times daily.   meclizine  (ANTIVERT ) 25 MG tablet TAKE 1 TABLET BY MOUTH 3 TIMES DAILY AS NEEDED FOR DIZZINESS.   Multiple Vitamin (MULTIVITAMIN) tablet Take 1 tablet by mouth daily.   nitroGLYCERIN  (NITROSTAT ) 0.4 MG SL tablet Place 1 tablet (0.4 mg total) under the tongue every 5 (five) minutes as needed for chest pain.   omeprazole  (PRILOSEC) 40 MG capsule Take 1 capsule (40 mg total) by mouth daily.   terbinafine  (LAMISIL ) 250 MG tablet Take 1 tablet (250 mg total) by mouth daily.   tiZANidine  (ZANAFLEX ) 2 MG tablet Take 0.5-2 tablets (1-4 mg total) by mouth every 6 (six) hours as needed for muscle spasms.   topiramate (TOPAMAX) 100 MG tablet Take 100 mg by mouth daily.    valACYclovir (VALTREX) 1000 MG tablet Take 1,000 mg by mouth as needed (cold sores).   No facility-administered encounter medications on file as of 09/27/2024.    History: Past Medical History:  Diagnosis Date   Allergy    Anemia    Angina pectoris    Anxiety    Cataract    Cervical cancer screening 01/31/2016   Menarche at 12 Regular and moderate flow No history of abnormal pap in past G1P1, s/p 1 SVD No history of abnormal MGM No concerns today gyn surgeries b/l tubal ligation and b/y oophorectomy   Colon cancer (HCC) 2010   Coronary artery disease 2001/2005   S/P stenting to RCA and LAD   GERD (gastroesophageal reflux disease)    Hearing loss 08/05/2015   History of chicken pox    HTN (hypertension)    Menopause    Preventative health care 01/31/2016   Pure hypercholesterolemia    Vitamin D  deficiency 06/01/2017   Past Surgical History:  Procedure Laterality Date   APPENDECTOMY  02/21/2009    BACK SURGERY  1978, 1988   multiple   BILATERAL OOPHORECTOMY  09/15/2008   CHOLECYSTECTOMY  07/16/2008   COLON SURGERY  09/15/2008   right colectomy   COLONOSCOPY  01/26/2014   NORMAL    CORONARY STENT PLACEMENT  2001/2005   RCA   CYSTECTOMY     left ovary   EYE SURGERY Bilateral 2024   L cataract 2016, R 2024   MELANOMA EXCISION Right 03/05/2022   right lower leg lesion removed   Family History  Problem Relation Age of Onset   Colon polyps Mother    Hypertension Mother    Transient ischemic attack Mother    Heart attack Mother        age 55   Heart disease Mother    Leukemia Mother    Heart attack Father        95's   Heart disease Father    Alcohol abuse Father    Arthritis Father        mva   Prostate cancer Father    Thyroid  disease Daughter    Coronary artery disease Other    Colon cancer Neg Hx    Pancreatic cancer Neg Hx    Rectal cancer Neg Hx    Stomach cancer Neg Hx    Esophageal cancer  Neg Hx    Social History   Occupational History   Not on file  Tobacco Use   Smoking status: Never   Smokeless tobacco: Never  Vaping Use   Vaping status: Never Used  Substance and Sexual Activity   Alcohol use: No   Drug use: No   Sexual activity: Yes    Birth control/protection: Post-menopausal    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, 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, no dietary restrictions   Tobacco Counseling Counseling given: No  SDOH Science Writer Insecurity: No Food Insecurity (09/27/2024)  Housing: Low Risk (09/27/2024)  Transportation Needs: No Transportation Needs (09/27/2024)  Utilities: Not At Risk (09/27/2024)  Alcohol Screen: Low Risk (09/22/2023)  Depression (PHQ2-9): Low Risk (09/27/2024)  Financial Resource Strain: Low Risk (09/20/2024)  Physical Activity: Insufficiently Active (09/27/2024)  Social Connections: Socially Integrated (09/27/2024)  Stress: No Stress Concern Present (09/27/2024)  Tobacco Use: Low Risk (09/27/2024)  Health Literacy: Adequate Health Literacy  (09/27/2024)   See flowsheets for full screening details  Depression Screen PHQ 2 & 9 Depression Scale- Over the past 2 weeks, how often have you been bothered by any of the following problems? Little interest or pleasure in doing things: 0 Feeling down, depressed, or hopeless (PHQ Adolescent also includes...irritable): 0 PHQ-2 Total Score: 0     Goals Addressed               This Visit's Progress     Remain active (pt-stated)               Objective:    Today's Vitals   09/27/24 1511  Weight: 130 lb (59 kg)  Height: 5' 1 (1.549 m)   Body mass index is 24.56 kg/m.  Hearing/Vision screen Hearing Screening - Comments:: Denies hearing difficulties   Vision Screening - Comments:: Wears rx glasses - up to date with routine eye exams with  Dr Malvina Immunizations and Health Maintenance Health Maintenance  Topic Date Due   DTaP/Tdap/Td (2 - Td or Tdap) 07/01/2021   Influenza Vaccine  04/15/2024   COVID-19 Vaccine (5 - 2025-26 season) 05/16/2024   Mammogram  06/20/2025   Medicare Annual Wellness (AWV)  09/27/2025   Colonoscopy  07/01/2026   Pneumococcal Vaccine: 50+ Years  Completed   Bone Density Scan  Completed   Hepatitis C Screening  Completed   Zoster Vaccines- Shingrix  Completed   Meningococcal B Vaccine  Aged Out        Assessment/Plan:  This is a routine wellness examination for Azjah.  Patient Care Team: Domenica Harlene LABOR, MD as PCP - General (Family Medicine) Delford Maude BROCKS, MD as PCP - Cardiology (Cardiology) Cloretta Arley NOVAK, MD as Consulting Physician (Oncology)  I have personally reviewed and noted the following in the patients chart:   Medical and social history Use of alcohol, tobacco or illicit drugs  Current medications and supplements including opioid prescriptions. Functional ability and status Nutritional status Physical activity Advanced directives List of other physicians Hospitalizations, surgeries, and ER visits in previous  12 months Vitals Screenings to include cognitive, depression, and falls Referrals and appointments  No orders of the defined types were placed in this encounter.  In addition, I have reviewed and discussed with patient certain preventive protocols, quality metrics, and best practice recommendations. A written personalized care plan for preventive services as well as general preventive health recommendations were provided to patient.   Rojelio LELON Blush, LPN   [id number]/[id number]   Return in 53 weeks (on 10/03/2025).  After Visit Summary: (MyChart) Due to this being a telephonic visit, the after visit summary with patients personalized plan was offered to patient via MyChart   Nurse Notes: No voiced or noted concerns at this time "

## 2024-09-28 MED ORDER — ASPIRIN 81 MG PO TBEC: 81.0000 mg | DELAYED_RELEASE_TABLET | Freq: Every day | ORAL | 1 refills | Status: AC

## 2024-09-30 ENCOUNTER — Other Ambulatory Visit: Payer: Self-pay | Admitting: Podiatry

## 2024-10-02 NOTE — Assessment & Plan Note (Signed)
 Encouraged to get adequate exercise, calcium and vitamin d intake

## 2024-10-02 NOTE — Assessment & Plan Note (Signed)
 Supplement and monitor

## 2024-10-02 NOTE — Assessment & Plan Note (Signed)
 No bleeding concerns

## 2024-10-02 NOTE — Progress Notes (Addendum)
 "  Subjective:    Patient ID: Brenda Lara, female    DOB: [birth year], 73 y.o.   MRN: [id number]  Chief Complaint  Patient presents with   Medical Management of Chronic Issues    Patient presents today for a 6 month follow-up.   Quality Metric Gaps    TDAP vaccine    HPI Discussed the use of AI scribe software for clinical note transcription with the patient, who gave verbal consent to proceed.  History of Present Illness Brenda Lara is a 74 year old female who presents with persistent back pain.  She experiences ongoing back pain that has remained stable over time. The pain is described as aggravating and affects her ability to sleep and perform daily activities. She typically wakes up around 6 AM due to discomfort, averaging about five to six hours of sleep per night, but feels rested upon waking.  The pain has shifted from the lower back to the mid to upper back, particularly around the left shoulder blade. She describes episodes of burning pain that 'comes and goes', sometimes exacerbated by certain positions or activities, such as sitting or taking a shower. The burning sensation is particularly intense when water hits the affected area, but there is no associated rash.  She has previously undergone evaluation and treatment with orthopedic specialists, including physical therapy, but found limited relief. She alternates between using heat and ice to manage the pain, with varying effectiveness. The pain impacts her daily life significantly, as she is unable to perform tasks like vacuuming and often needs to rest on a heating pad after cooking.  A prior imaging study of the thoracic spine from November 2024 showed straightening of the normal curvature, moderate disc space narrowing, and some vertebral shifting.  No bowel or urinary issues, breathing difficulties, or other systemic symptoms.    Past Medical History:  Diagnosis Date   Allergy    Anemia    Angina pectoris     Anxiety    Arthritis 2019   Cataract    Cervical cancer screening 01/31/2016   Menarche at 12 Regular and moderate flow No history of abnormal pap in past G1P1, s/p 1 SVD No history of abnormal MGM No concerns today gyn surgeries b/l tubal ligation and b/y oophorectomy   Colon cancer (HCC) 2010   Coronary artery disease 2001/2005   S/P stenting to RCA and LAD   GERD (gastroesophageal reflux disease)    Hearing loss 08/05/2015   History of chicken pox    HTN (hypertension)    Menopause    Preventative health care 01/31/2016   Pure hypercholesterolemia    Vitamin D  deficiency 06/01/2017    Past Surgical History:  Procedure Laterality Date   ABDOMINAL HYSTERECTOMY  2010   APPENDECTOMY  02/21/2009   BACK SURGERY  1978, 1988   multiple   BILATERAL OOPHORECTOMY  09/15/2008   CHOLECYSTECTOMY  07/16/2008   COLON SURGERY  09/15/2008   right colectomy   COLONOSCOPY  01/26/2014   NORMAL    CORONARY STENT PLACEMENT  2001/2005   RCA   CYSTECTOMY     left ovary   EYE SURGERY Bilateral 2024   L cataract 2016, R 2024   MELANOMA EXCISION Right 03/05/2022   right lower leg lesion removed   SPINE SURGERY  1974    Family History  Problem Relation Age of Onset   Colon polyps Mother    Hypertension Mother    Transient ischemic attack Mother  Heart attack Mother        age 15   Heart disease Mother    Leukemia Mother    Cancer Mother    Heart attack Father        64's   Heart disease Father    Alcohol abuse Father    Arthritis Father        mva   Prostate cancer Father    Cancer Father    Thyroid  disease Daughter    Coronary artery disease Other    Diabetes Daughter    Colon cancer Neg Hx    Pancreatic cancer Neg Hx    Rectal cancer Neg Hx    Stomach cancer Neg Hx    Esophageal cancer Neg Hx     Social History   Socioeconomic History   Marital status: Married    Spouse name: Not on file   Number of children: Not on file   Years of education: Not on file    Highest education level: Some college, no degree  Occupational History   Not on file  Tobacco Use   Smoking status: Never   Smokeless tobacco: Never  Vaping Use   Vaping status: Never Used  Substance and Sexual Activity   Alcohol use: No   Drug use: No   Sexual activity: Yes    Birth control/protection: Post-menopausal    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, 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[employer], no dietary restrictions  Other Topics Concern   Not on file  Social History Narrative   Not on file   Social Drivers of Health   Tobacco Use: Low Risk (10/06/2024)   Patient History    Smoking Tobacco Use: Never    Smokeless Tobacco Use: Never    Passive Exposure: Not on file  Financial Resource Strain: Low Risk (09/20/2024)   Overall Financial Resource Strain (CARDIA)    Difficulty of Paying Living Expenses: Not hard at all  Food Insecurity: No Food Insecurity (09/27/2024)   Epic    Worried About Programme Researcher, Broadcasting/film/video in the Last Year: Never true    Ran Out of Food in the Last Year: Never true  Transportation Needs: No Transportation Needs (09/27/2024)   Epic    Lack of Transportation (Medical): No    Lack of Transportation (Non-Medical): No  Physical Activity: Insufficiently Active (09/27/2024)   Exercise Vital Sign    Days of Exercise per Week: 3 days    Minutes of Exercise per Session: 30 min  Stress: No Stress Concern Present (09/27/2024)   [employer] of Occupational Health - Occupational Stress Questionnaire    Feeling of Stress: Not at all  Social Connections: Socially Integrated (09/27/2024)   Social Connection and Isolation Panel    Frequency of Communication with Friends and Family: Three times a week    Frequency of Social Gatherings with Friends and Family: Twice a week    Attends Religious Services: More than 4 times per year    Active Member of Clubs or Organizations: Yes    Attends Banker Meetings: More than 4 times per year    Marital Status: Married  Catering Manager  Violence: Not At Risk (09/27/2024)   Epic    Fear of Current or Ex-Partner: No    Emotionally Abused: No    Physically Abused: No    Sexually Abused: No  Depression (PHQ2-9): Low Risk (09/27/2024)   Depression (PHQ2-9)    PHQ-2 Score: 0  Alcohol Screen: Low Risk (09/22/2023)   Alcohol Screen  Last Alcohol Screening Score (AUDIT): 0  Housing: Low Risk (09/27/2024)   Epic    Unable to Pay for Housing in the Last Year: No    Number of Times Moved in the Last Year: 0    Homeless in the Last Year: No  Utilities: Not At Risk (09/27/2024)   Epic    Threatened with loss of utilities: No  Health Literacy: Adequate Health Literacy (09/27/2024)   B1300 Health Literacy    Frequency of need for help with medical instructions: Never    Outpatient Medications Prior to Visit  Medication Sig Dispense Refill   aspirin  EC (ASPIRIN  LOW DOSE) 81 MG tablet Take 1 tablet (81 mg total) by mouth daily. SWALLOW WHOLE. 90 tablet 1   atorvastatin  (LIPITOR) 40 MG tablet Take 1 tablet (40 mg total) by mouth daily. 90 tablet 3   Azelastine  HCl 137 MCG/SPRAY SOLN Place 1 spray into both nostrils 2 (two) times daily as needed. 90 mL 1   Cholecalciferol (VITAMIN D3) 50 MCG (2000 UT) capsule Take 1 capsule (2,000 Units total) by mouth daily.     cycloSPORINE (RESTASIS) 0.05 % ophthalmic emulsion 1 drop 2 (two) times daily.     meclizine  (ANTIVERT ) 25 MG tablet TAKE 1 TABLET BY MOUTH 3 TIMES DAILY AS NEEDED FOR DIZZINESS. 90 tablet 0   Multiple Vitamin (MULTIVITAMIN) tablet Take 1 tablet by mouth daily.     nitroGLYCERIN  (NITROSTAT ) 0.4 MG SL tablet Place 1 tablet (0.4 mg total) under the tongue every 5 (five) minutes as needed for chest pain. 25 tablet 3   omeprazole  (PRILOSEC) 40 MG capsule Take 1 capsule (40 mg total) by mouth daily. 90 capsule 1   tiZANidine  (ZANAFLEX ) 2 MG tablet Take 0.5-2 tablets (1-4 mg total) by mouth every 6 (six) hours as needed for muscle spasms. 30 tablet 1   topiramate (TOPAMAX) 100 MG  tablet Take 100 mg by mouth daily.      valACYclovir (VALTREX) 1000 MG tablet Take 1,000 mg by mouth as needed (cold sores).     atenolol  (TENORMIN ) 50 MG tablet TAKE 1 TABLET BY MOUTH TWICE A DAY 180 tablet 1   terbinafine  (LAMISIL ) 250 MG tablet Take 1 tablet (250 mg total) by mouth daily. 90 tablet 0   No facility-administered medications prior to visit.    Allergies[1]  Review of Systems  Constitutional:  Negative for fever and malaise/fatigue.  HENT:  Negative for congestion.   Eyes:  Negative for blurred vision.  Respiratory:  Negative for shortness of breath.   Cardiovascular:  Negative for chest pain, palpitations and leg swelling.  Gastrointestinal:  Negative for abdominal pain, blood in stool and nausea.  Genitourinary:  Negative for dysuria and frequency.  Musculoskeletal:  Positive for back pain. Negative for falls.  Skin:  Negative for rash.  Neurological:  Negative for dizziness, loss of consciousness and headaches.  Endo/Heme/Allergies:  Negative for environmental allergies.  Psychiatric/Behavioral:  Negative for depression. The patient is not nervous/anxious.        Objective:    Physical Exam Constitutional:      General: She is not in acute distress.    Appearance: Normal appearance. She is well-developed. She is not toxic-appearing.  HENT:     Head: Normocephalic and atraumatic.     Right Ear: External ear normal.     Left Ear: External ear normal.     Nose: Nose normal.  Eyes:     General:        Right  eye: No discharge.        Left eye: No discharge.     Conjunctiva/sclera: Conjunctivae normal.  Neck:     Thyroid : No thyromegaly.  Cardiovascular:     Rate and Rhythm: Normal rate and regular rhythm.     Heart sounds: Normal heart sounds. No murmur heard. Pulmonary:     Effort: Pulmonary effort is normal. No respiratory distress.     Breath sounds: Normal breath sounds.  Abdominal:     General: Bowel sounds are normal.     Palpations: Abdomen is  soft.     Tenderness: There is no abdominal tenderness. There is no guarding.  Musculoskeletal:        General: Normal range of motion.     Cervical back: Neck supple.  Lymphadenopathy:     Cervical: No cervical adenopathy.  Skin:    General: Skin is warm and dry.  Neurological:     Mental Status: She is alert and oriented to person, place, and time.  Psychiatric:        Mood and Affect: Mood normal.        Behavior: Behavior normal.        Thought Content: Thought content normal.        Judgment: Judgment normal.     BP 138/70   Pulse (!) 59   Temp 98.2 F (36.8 C)   Resp 16   Ht 5' 1 (1.549 m)   Wt 132 lb 12.8 oz (60.2 kg)   SpO2 98%   BMI 25.09 kg/m  Wt Readings from Last 3 Encounters:  10/06/24 132 lb 12.8 oz (60.2 kg)  09/27/24 130 lb (59 kg)  04/06/24 132 lb 12.8 oz (60.2 kg)    Diabetic Foot Exam - Simple   No data filed    Lab Results  Component Value Date   WBC 7.3 10/06/2024   HGB 11.6 (L) 10/06/2024   HCT 35.7 (L) 10/06/2024   PLT 290.0 10/06/2024   GLUCOSE 98 10/06/2024   CHOL 129 10/06/2024   TRIG 101.0 10/06/2024   HDL 69.70 10/06/2024   LDLCALC 39 10/06/2024   ALT 19 10/06/2024   AST 24 10/06/2024   NA 137 10/06/2024   K 4.2 10/06/2024   CL 104 10/06/2024   CREATININE 0.73 10/06/2024   BUN 21 10/06/2024   CO2 26 10/06/2024   TSH 2.03 10/06/2024   INR 0.95 05/13/2011   HGBA1C 6.1 10/06/2024    Lab Results  Component Value Date   TSH 2.03 10/06/2024   Lab Results  Component Value Date   WBC 7.3 10/06/2024   HGB 11.6 (L) 10/06/2024   HCT 35.7 (L) 10/06/2024   MCV 77.8 (L) 10/06/2024   PLT 290.0 10/06/2024   Lab Results  Component Value Date   NA 137 10/06/2024   K 4.2 10/06/2024   CO2 26 10/06/2024   GLUCOSE 98 10/06/2024   BUN 21 10/06/2024   CREATININE 0.73 10/06/2024   BILITOT 0.4 10/06/2024   ALKPHOS 99 10/06/2024   AST 24 10/06/2024   ALT 19 10/06/2024   PROT 7.1 10/06/2024   ALBUMIN 4.4 10/06/2024   CALCIUM   9.7 10/06/2024   EGFR 73 12/10/2022   GFR 81.76 10/06/2024   Lab Results  Component Value Date   CHOL 129 10/06/2024   Lab Results  Component Value Date   HDL 69.70 10/06/2024   Lab Results  Component Value Date   LDLCALC 39 10/06/2024   Lab Results  Component Value Date  TRIG 101.0 10/06/2024   Lab Results  Component Value Date   CHOLHDL 2 10/06/2024   Lab Results  Component Value Date   HGBA1C 6.1 10/06/2024       Assessment & Plan:  Vitamin D  deficiency Assessment & Plan: Supplement and monitor   Orders: -     VITAMIN D  25 Hydroxy (Vit-D Deficiency, Fractures)  Osteoporosis, unspecified osteoporosis type, unspecified pathological fracture presence Assessment & Plan: Encouraged to get adequate exercise, calcium  and vitamin d  intake    Hyperglycemia Assessment & Plan: hgba1c acceptable, minimize simple carbs. Increase exercise as tolerated.   Orders: -     Hemoglobin A1c -     Ambulatory referral to Sports Medicine  HYPERCHOLESTEROLEMIA Assessment & Plan: Encourage heart healthy diet such as MIND or DASH diet, increase exercise, avoid trans fats, simple carbohydrates and processed foods, consider a krill or fish or flaxseed oil cap daily. Tolerating Atorvastatin   Orders: -     TSH -     Lipid panel  Essential hypertension Assessment & Plan: Well controlled, no changes to meds. Encouraged heart healthy diet such as the DASH diet and exercise as tolerated.    Orders: -     Comprehensive metabolic panel with GFR -     CBC with Differential/Platelet  History of colon cancer Assessment & Plan: Doing well, bowels moving comfortable   CONGENITAL FACTOR VIII DISORDER  Left-sided back pain, unspecified back location, unspecified chronicity Assessment & Plan: Mid to upper back is having some episodes of burning over left shoulder blad  Orders: -     Ambulatory referral to Sports Medicine -     DG Thoracic Spine 2 View; Future    Assessment  and Plan Assessment & Plan Chronic thoracic back pain Intermittent burning sensation, primarily on the left side near the shoulder blade. Pain is persistent and affects daily activities. Previous orthopedic evaluation and physical therapy were not effective. Differential includes nerve-related pain, possibly shingles, though no rash is present. Imaging from November 2024 showed moderate disc space narrowing and vertebral shifting, consistent with age-related degeneration. - Ordered thoracic spine x-ray to assess for progression of degenerative changes. - Referred to sports medicine for evaluation and management, including potential physical therapy, ultrasound, x-ray, injections, and other treatments. - Discussed potential benefits of sports medicine evaluation, including non-surgical management options and functional rehabilitation. - Discussed insurance coverage for sports medicine consultation, with a copay of $35 per visit.  Prediabetes A1c is 6.3, indicating prediabetes. Monitoring is necessary to prevent progression to diabetes. - Ordered blood work to monitor A1c levels. - Discussed dietary management and potential referral to a nutritionist. - Consider glucometer for home blood sugar monitoring.  Recording duration: 16 minutes     Harlene Horton, MD     [1] No Known Allergies  "

## 2024-10-02 NOTE — Assessment & Plan Note (Signed)
 Well controlled, no changes to meds. Encouraged heart healthy diet such as the DASH diet and exercise as tolerated.

## 2024-10-02 NOTE — Assessment & Plan Note (Signed)
 Encourage heart healthy diet such as MIND or DASH diet, increase exercise, avoid trans fats, simple carbohydrates and processed foods, consider a krill or fish or flaxseed oil cap daily. Tolerating Atorvastatin

## 2024-10-02 NOTE — Assessment & Plan Note (Signed)
Doing well, bowels moving comfortable

## 2024-10-02 NOTE — Assessment & Plan Note (Signed)
 hgba1c acceptable, minimize simple carbs. Increase exercise as tolerated.

## 2024-10-05 ENCOUNTER — Ambulatory Visit: Admitting: Podiatry

## 2024-10-05 ENCOUNTER — Encounter: Payer: Self-pay | Admitting: Podiatry

## 2024-10-05 DIAGNOSIS — B351 Tinea unguium: Secondary | ICD-10-CM

## 2024-10-05 NOTE — Addendum Note (Signed)
 Addended by: WAYLAN ELIDIA PARAS on: 10/05/2024 04:26 PM   Modules accepted: Orders

## 2024-10-05 NOTE — Progress Notes (Signed)
"  °  Subjective:  Patient ID: Brenda Lara, female    DOB: Sep 19, 1950,   MRN: [id number]  Chief Complaint  Patient presents with   Nail Problem    They're coming on I think.  It's a very slow process.  I'm about to think it's not going to happen.    74 y.o. female presents for follow-up of fungal nails.  She has finished the second round of Lamisil  and not sure she is noticing much of a difference.  She relates some soreness around the second toe as well.  i . . Denies any other pedal complaints. Denies n/v/f/c.   Past Medical History:  Diagnosis Date   Allergy    Anemia    Angina pectoris    Anxiety    Cataract    Cervical cancer screening 01/31/2016   Menarche at 12 Regular and moderate flow No history of abnormal pap in past G1P1, s/p 1 SVD No history of abnormal MGM No concerns today gyn surgeries b/l tubal ligation and b/y oophorectomy   Colon cancer (HCC) 2010   Coronary artery disease 2001/2005   S/P stenting to RCA and LAD   GERD (gastroesophageal reflux disease)    Hearing loss 08/05/2015   History of chicken pox    HTN (hypertension)    Menopause    Preventative health care 01/31/2016   Pure hypercholesterolemia    Vitamin D  deficiency 06/01/2017    Objective:  Physical Exam: Vascular: DP/PT pulses 2/4 bilateral. CFT <3 seconds. Normal hair growth on digits. No edema.  Skin. No lacerations or abrasions bilateral feet. Bialteral hallux nails thickened and dystrophic with subungual debris. Some improvement noted to proximal aspects of nail Musculoskeletal: MMT 5/5 bilateral lower extremities in DF, PF, Inversion and Eversion. Deceased ROM in DF of ankle joint.  Neurological: Sensation intact to light touch.   Assessment:   1. Onychomycosis         Plan:  Patient was evaluated and treated and all questions answered. -Examined patient -Discussed treatment options for painful dystrophic nails  - Discussed reculturing at this point to determine if there is  still need for fungal treatments or what is left is no longer fungus.  Culture taken from affected nails and sent to [facility] -Discussed fungal nail treatment options including oral, topical, and laser treatments.  -l continue with tea tree oil at this time -Patient to return in 4 weeks to discuss culture results.   Asberry Failing, DPM    "

## 2024-10-06 ENCOUNTER — Ambulatory Visit: Admitting: Family Medicine

## 2024-10-06 ENCOUNTER — Ambulatory Visit: Payer: Self-pay | Admitting: Family Medicine

## 2024-10-06 ENCOUNTER — Ambulatory Visit (HOSPITAL_BASED_OUTPATIENT_CLINIC_OR_DEPARTMENT_OTHER)
Admission: RE | Admit: 2024-10-06 | Discharge: 2024-10-06 | Disposition: A | Discharge status: Home / Self Care | Source: Ambulatory Visit | Attending: Family Medicine | Admitting: Family Medicine

## 2024-10-06 ENCOUNTER — Encounter: Payer: Self-pay | Admitting: Family Medicine

## 2024-10-06 VITALS — BP 138/70 | HR 59 | Temp 98.2°F | Resp 16 | Ht 61.0 in | Wt 132.8 lb

## 2024-10-06 DIAGNOSIS — M810 Age-related osteoporosis without current pathological fracture: Secondary | ICD-10-CM | POA: Diagnosis not present

## 2024-10-06 DIAGNOSIS — E559 Vitamin D deficiency, unspecified: Secondary | ICD-10-CM | POA: Diagnosis not present

## 2024-10-06 DIAGNOSIS — M549 Dorsalgia, unspecified: Secondary | ICD-10-CM

## 2024-10-06 DIAGNOSIS — E7800 Pure hypercholesterolemia, unspecified: Secondary | ICD-10-CM

## 2024-10-06 DIAGNOSIS — R739 Hyperglycemia, unspecified: Secondary | ICD-10-CM

## 2024-10-06 DIAGNOSIS — D66 Hereditary factor VIII deficiency: Secondary | ICD-10-CM

## 2024-10-06 DIAGNOSIS — Z85038 Personal history of other malignant neoplasm of large intestine: Secondary | ICD-10-CM

## 2024-10-06 DIAGNOSIS — D649 Anemia, unspecified: Secondary | ICD-10-CM

## 2024-10-06 DIAGNOSIS — I10 Essential (primary) hypertension: Secondary | ICD-10-CM

## 2024-10-06 LAB — LIPID PANEL
Cholesterol: 129 mg/dL (ref 28–200)
HDL: 69.7 mg/dL
LDL Cholesterol: 39 mg/dL (ref 10–99)
NonHDL: 59.09
Total CHOL/HDL Ratio: 2
Triglycerides: 101 mg/dL (ref 10.0–149.0)
VLDL: 20.2 mg/dL (ref 0.0–40.0)

## 2024-10-06 LAB — COMPREHENSIVE METABOLIC PANEL WITH GFR
ALT: 19 U/L (ref 3–35)
AST: 24 U/L (ref 5–37)
Albumin: 4.4 g/dL (ref 3.5–5.2)
Alkaline Phosphatase: 99 U/L (ref 39–117)
BUN: 21 mg/dL (ref 6–23)
CO2: 26 meq/L (ref 19–32)
Calcium: 9.7 mg/dL (ref 8.4–10.5)
Chloride: 104 meq/L (ref 96–112)
Creatinine, Ser: 0.73 mg/dL (ref 0.40–1.20)
GFR: 81.76 mL/min
Glucose, Bld: 98 mg/dL (ref 70–99)
Potassium: 4.2 meq/L (ref 3.5–5.1)
Sodium: 137 meq/L (ref 135–145)
Total Bilirubin: 0.4 mg/dL (ref 0.2–1.2)
Total Protein: 7.1 g/dL (ref 6.0–8.3)

## 2024-10-06 LAB — HEMOGLOBIN A1C: Hgb A1c MFr Bld: 6.1 % (ref 4.6–6.5)

## 2024-10-06 LAB — VITAMIN D 25 HYDROXY (VIT D DEFICIENCY, FRACTURES): VITD: 44.15 ng/mL (ref 30.00–100.00)

## 2024-10-06 LAB — CBC WITH DIFFERENTIAL/PLATELET
Basophils Absolute: 0 K/uL (ref 0.0–0.1)
Basophils Relative: 0.3 % (ref 0.0–3.0)
Eosinophils Absolute: 0 K/uL (ref 0.0–0.7)
Eosinophils Relative: 0.5 % (ref 0.0–5.0)
HCT: 35.7 % — ABNORMAL LOW (ref 36.0–46.0)
Hemoglobin: 11.6 g/dL — ABNORMAL LOW (ref 12.0–15.0)
Lymphocytes Relative: 20.5 % (ref 12.0–46.0)
Lymphs Abs: 1.5 K/uL (ref 0.7–4.0)
MCHC: 32.6 g/dL (ref 30.0–36.0)
MCV: 77.8 fl — ABNORMAL LOW (ref 78.0–100.0)
Monocytes Absolute: 0.6 K/uL (ref 0.1–1.0)
Monocytes Relative: 7.6 % (ref 3.0–12.0)
Neutro Abs: 5.2 K/uL (ref 1.4–7.7)
Neutrophils Relative %: 71.1 % (ref 43.0–77.0)
Platelets: 290 K/uL (ref 150.0–400.0)
RBC: 4.59 Mil/uL (ref 3.87–5.11)
RDW: 15.2 % (ref 11.5–15.5)
WBC: 7.3 K/uL (ref 4.0–10.5)

## 2024-10-06 LAB — TSH: TSH: 2.03 u[IU]/mL (ref 0.35–5.50)

## 2024-10-06 NOTE — Patient Instructions (Signed)

## 2024-10-06 NOTE — Assessment & Plan Note (Signed)
 Mid to upper back is having some episodes of burning over left shoulder blad

## 2024-10-07 ENCOUNTER — Encounter: Payer: Self-pay | Admitting: Family Medicine

## 2024-10-09 ENCOUNTER — Other Ambulatory Visit: Payer: Self-pay | Admitting: Family Medicine

## 2024-10-11 NOTE — Progress Notes (Signed)
"  Pt reviewed via MyChart.   "

## 2024-10-13 ENCOUNTER — Telehealth: Payer: Self-pay

## 2024-10-13 ENCOUNTER — Ambulatory Visit

## 2024-10-13 VITALS — BP 150/72 | Ht 61.0 in | Wt 132.0 lb

## 2024-10-13 DIAGNOSIS — M5384 Other specified dorsopathies, thoracic region: Secondary | ICD-10-CM | POA: Diagnosis not present

## 2024-10-13 DIAGNOSIS — M4004 Postural kyphosis, thoracic region: Secondary | ICD-10-CM | POA: Diagnosis not present

## 2024-10-13 DIAGNOSIS — M549 Dorsalgia, unspecified: Secondary | ICD-10-CM

## 2024-10-13 NOTE — Telephone Encounter (Signed)
 I spoke with pt and she stated that it may have came from one of her surgeries from 2010. She stated no one has ever asked her about that diagnosis and she really doesn't know what it was from. She want to know what it means and if she should mention it to her other providers?

## 2024-10-13 NOTE — Telephone Encounter (Signed)
 I need help with this patient I got a coding query about her diagnosis: Hereditary factor VIII deficiency. it has been in her chart since 2010 but we have never discussed it. can we please confirm with her that this is correct as far as she knows.

## 2024-10-13 NOTE — Progress Notes (Signed)
" ° °  Subjective:    Patient ID: Brenda Lara, female    DOB: 74 y.o., [birth year]   MRN: [id number]  Chief Complaint: Bilateral mid back pain  History of Present Illness Brenda Lara is a 74 year old female with past medical history significant for hypertension, CAD, osteoporosis, history of colon cancer presenting for evaluation of bilateral mid back pain.  Notes this feels like it came on more in the past 2 months.  No known inciting injury or change in activity.  Has felt similar issues with her back in the past but this is notably much more significant.  Worse particularly after standing for a while to make dinner.  Feels like a burning sensation when it acts up.  No numbness or tingling in either upper or lower extremities.  Review of Pertinent Imaging: 3 view plain film radiographs obtained of the thoracic spine on 10/06/2024 per my independent evaluation revealing osteopenia. No significant anterior wedging of the vertebral bodies in the thoracic spine appreciable, though this is limited due to overlapping structures.  Significant cervical spinal degenerative changes.  Modest thoracic spine degenerative changes.    Objective:   Vitals:   10/13/24 1402  BP: (!) 150/72   Thoracic spine -Inspection: no swelling or skin changes.  Slightly exaggerated kyphosis through the thoracic spine and diminished lordosis of the cervical spine. -Palpation: TTP - midline, + paraspinals (right worse than left) -AROM/PROM: Range of motion mostly limited with extension in the mid back.  Largely well-preserved range of motion in other planes. -Special tests: equivocal stork, negative slump test     Assessment & Plan:   Assessment & Plan Brenda Lara is a very pleasant 74 year old female who appears to have several chronic postural changes in her thoracic spine predisposing to myofascial/muscular pain in her thoracic spine.  It less likely though possible cause of this would be pinching of the dorsal rami but this  would be very unlikely to onset bilaterally at about the exact same time.  Review of her thoracic spine x-rays does not reveal appreciable vertebral height loss, though there are degenerative changes throughout her thoracic spine and very significantly in her cervical spine.  There is some height loss anteriorly of the intervertebral discs in the thoracic spine which likely further contributes to some of her kyphotic posture, but given that she has not engaged with physical therapy I believe this is an excellent place to start.  She will contact her cardiologist and notify me if they feel comfortable with me prescribing a short course of an anti-inflammatory medicine, but I do not foresee any issue with this.  We will plan for follow-up in 6 weeks to reassess.  Can consider trigger point injections, and/or advanced imaging if pain worsens/persists.  45 minutes were spent in face-to-face time directly discussing with the patient the onset of her symptoms, conducting a physical exam, discussing potential diagnoses, reviewing treatment  options, and documenting the aforementioned on the same date of service. "

## 2024-10-14 ENCOUNTER — Other Ambulatory Visit: Payer: Self-pay | Admitting: Family Medicine

## 2024-10-14 NOTE — Telephone Encounter (Signed)
 Patient would like the referral to hematology.

## 2024-10-17 ENCOUNTER — Other Ambulatory Visit: Payer: Self-pay | Admitting: Family Medicine

## 2024-10-17 DIAGNOSIS — Z862 Personal history of diseases of the blood and blood-forming organs and certain disorders involving the immune mechanism: Secondary | ICD-10-CM

## 2024-10-21 ENCOUNTER — Other Ambulatory Visit: Payer: Self-pay | Admitting: Family

## 2024-10-21 ENCOUNTER — Telehealth: Payer: Self-pay | Admitting: Family Medicine

## 2024-10-21 DIAGNOSIS — G4489 Other headache syndrome: Secondary | ICD-10-CM

## 2024-10-21 NOTE — Telephone Encounter (Signed)
 Good Morning Dr. Domenica,   We received a fax today from [facility] stating she needs a referral per her [facility] medicare insurance. I don't see that you have referred her to this location. So we would need a referral before be can update the portal and request the auth.  On the fax They provided a dx of ICD 10 g43.719 g43.19 r51.9  She is a current patient of Dr. Layman JAYSON Meissner.   If you can enter a referral with that info Myself or Lalia will be more than glad to get it processed in the [facility] portal and update this Physician's office.   Thank you   Sherri

## 2024-10-31 ENCOUNTER — Inpatient Hospital Stay: Admitting: Hematology & Oncology

## 2024-10-31 ENCOUNTER — Inpatient Hospital Stay

## 2024-11-02 ENCOUNTER — Ambulatory Visit: Admitting: Podiatry

## 2024-11-07 ENCOUNTER — Other Ambulatory Visit

## 2024-12-05 ENCOUNTER — Ambulatory Visit

## 2025-01-12 ENCOUNTER — Ambulatory Visit: Admitting: Family Medicine

## 2025-10-03 ENCOUNTER — Ambulatory Visit
